# Patient Record
Sex: Female | Born: 1965 | Race: White | Hispanic: No | Marital: Married | Smoking: Never smoker
Health system: Southern US, Community
[De-identification: ages and names within clinical notes are randomized; demographics above are authoritative.]

## PROBLEM LIST (undated history)

## (undated) DIAGNOSIS — E785 Hyperlipidemia, unspecified: Secondary | ICD-10-CM

## (undated) DIAGNOSIS — I499 Cardiac arrhythmia, unspecified: Secondary | ICD-10-CM

## (undated) DIAGNOSIS — Z800 Family history of malignant neoplasm of digestive organs: Secondary | ICD-10-CM

## (undated) DIAGNOSIS — E079 Disorder of thyroid, unspecified: Secondary | ICD-10-CM

## (undated) DIAGNOSIS — C801 Malignant (primary) neoplasm, unspecified: Secondary | ICD-10-CM

## (undated) DIAGNOSIS — E039 Hypothyroidism, unspecified: Secondary | ICD-10-CM

## (undated) DIAGNOSIS — Z8042 Family history of malignant neoplasm of prostate: Secondary | ICD-10-CM

## (undated) DIAGNOSIS — Z803 Family history of malignant neoplasm of breast: Secondary | ICD-10-CM

## (undated) HISTORY — DX: Cardiac arrhythmia, unspecified: I49.9

## (undated) HISTORY — DX: Family history of malignant neoplasm of prostate: Z80.42

## (undated) HISTORY — DX: Hyperlipidemia, unspecified: E78.5

## (undated) HISTORY — DX: Family history of malignant neoplasm of breast: Z80.3

## (undated) HISTORY — DX: Disorder of thyroid, unspecified: E07.9

## (undated) HISTORY — PX: BREAST LUMPECTOMY: SHX2

## (undated) HISTORY — DX: Family history of malignant neoplasm of digestive organs: Z80.0

---

## 1994-08-15 HISTORY — PX: OTHER SURGICAL HISTORY: SHX169

## 1998-06-30 ENCOUNTER — Other Ambulatory Visit: Admission: RE | Admit: 1998-06-30 | Discharge: 1998-06-30 | Payer: Self-pay | Admitting: Obstetrics and Gynecology

## 1999-07-20 ENCOUNTER — Other Ambulatory Visit: Admission: RE | Admit: 1999-07-20 | Discharge: 1999-07-20 | Payer: Self-pay | Admitting: Obstetrics and Gynecology

## 2000-10-17 ENCOUNTER — Other Ambulatory Visit: Admission: RE | Admit: 2000-10-17 | Discharge: 2000-10-17 | Payer: Self-pay | Admitting: Obstetrics and Gynecology

## 2002-01-02 ENCOUNTER — Other Ambulatory Visit: Admission: RE | Admit: 2002-01-02 | Discharge: 2002-01-02 | Payer: Self-pay | Admitting: Obstetrics and Gynecology

## 2003-01-06 ENCOUNTER — Other Ambulatory Visit: Admission: RE | Admit: 2003-01-06 | Discharge: 2003-01-06 | Payer: Self-pay | Admitting: Obstetrics and Gynecology

## 2004-06-28 ENCOUNTER — Other Ambulatory Visit: Admission: RE | Admit: 2004-06-28 | Discharge: 2004-06-28 | Payer: Self-pay | Admitting: Obstetrics and Gynecology

## 2005-07-15 ENCOUNTER — Other Ambulatory Visit: Admission: RE | Admit: 2005-07-15 | Discharge: 2005-07-15 | Payer: Self-pay | Admitting: Obstetrics and Gynecology

## 2009-06-26 ENCOUNTER — Encounter: Admission: RE | Admit: 2009-06-26 | Discharge: 2009-06-26 | Payer: Self-pay | Admitting: Obstetrics and Gynecology

## 2010-09-05 ENCOUNTER — Encounter: Payer: Self-pay | Admitting: Obstetrics and Gynecology

## 2013-07-30 ENCOUNTER — Other Ambulatory Visit: Payer: Self-pay | Admitting: Obstetrics and Gynecology

## 2013-07-30 DIAGNOSIS — Z803 Family history of malignant neoplasm of breast: Secondary | ICD-10-CM

## 2014-03-03 ENCOUNTER — Ambulatory Visit: Payer: Self-pay | Admitting: Otolaryngology

## 2014-03-03 LAB — HCG, QUANTITATIVE, PREGNANCY: Beta Hcg, Quant.: 2 m[IU]/mL

## 2016-06-03 ENCOUNTER — Other Ambulatory Visit: Payer: Self-pay | Admitting: Physician Assistant

## 2016-06-03 DIAGNOSIS — R1084 Generalized abdominal pain: Secondary | ICD-10-CM

## 2016-06-09 ENCOUNTER — Encounter: Payer: Self-pay | Admitting: Radiology

## 2016-06-09 ENCOUNTER — Ambulatory Visit
Admission: RE | Admit: 2016-06-09 | Discharge: 2016-06-09 | Disposition: A | Discharge status: Home / Self Care | Payer: Managed Care, Other (non HMO) | Source: Ambulatory Visit | Attending: Physician Assistant | Admitting: Physician Assistant

## 2016-06-09 DIAGNOSIS — N854 Malposition of uterus: Secondary | ICD-10-CM | POA: Insufficient documentation

## 2016-06-09 DIAGNOSIS — M47816 Spondylosis without myelopathy or radiculopathy, lumbar region: Secondary | ICD-10-CM | POA: Diagnosis not present

## 2016-06-09 DIAGNOSIS — R109 Unspecified abdominal pain: Secondary | ICD-10-CM | POA: Insufficient documentation

## 2016-06-09 DIAGNOSIS — R1084 Generalized abdominal pain: Secondary | ICD-10-CM

## 2016-06-09 DIAGNOSIS — K760 Fatty (change of) liver, not elsewhere classified: Secondary | ICD-10-CM | POA: Diagnosis not present

## 2016-06-09 MED ORDER — IOPAMIDOL (ISOVUE-300) INJECTION 61%
100.0000 mL | Freq: Once | INTRAVENOUS | Status: AC | PRN
  Administered 2016-06-09: 100 mL via INTRAVENOUS

## 2016-07-12 ENCOUNTER — Emergency Department
Admission: EM | Admit: 2016-07-12 | Discharge: 2016-07-12 | Disposition: A | Discharge status: Home / Self Care | Payer: Managed Care, Other (non HMO) | Attending: Emergency Medicine | Admitting: Emergency Medicine

## 2016-07-12 ENCOUNTER — Encounter: Payer: Self-pay | Admitting: Emergency Medicine

## 2016-07-12 ENCOUNTER — Emergency Department: Payer: Managed Care, Other (non HMO)

## 2016-07-12 DIAGNOSIS — J209 Acute bronchitis, unspecified: Secondary | ICD-10-CM | POA: Diagnosis not present

## 2016-07-12 DIAGNOSIS — R05 Cough: Secondary | ICD-10-CM | POA: Diagnosis present

## 2016-07-12 MED ORDER — BENZONATATE 200 MG PO CAPS: 200.0000 mg | ORAL_CAPSULE | Freq: Three times a day (TID) | ORAL | 0 refills | Status: DC | PRN

## 2016-07-12 MED ORDER — METHYLPREDNISOLONE SODIUM SUCC 125 MG IJ SOLR
125.0000 mg | Freq: Once | INTRAMUSCULAR | Status: AC
  Administered 2016-07-12: 125 mg via INTRAMUSCULAR
  Filled 2016-07-12: qty 2

## 2016-07-12 MED ORDER — ALBUTEROL SULFATE HFA 108 (90 BASE) MCG/ACT IN AERS: 2.0000 | INHALATION_SPRAY | RESPIRATORY_TRACT | 0 refills | Status: DC | PRN

## 2016-07-12 MED ORDER — PREDNISONE 50 MG PO TABS: 50.0000 mg | ORAL_TABLET | Freq: Every day | ORAL | 0 refills | Status: DC

## 2016-07-12 MED ORDER — PSEUDOEPH-BROMPHEN-DM 30-2-10 MG/5ML PO SYRP: 10.0000 mL | ORAL_SOLUTION | Freq: Four times a day (QID) | ORAL | 0 refills | Status: DC | PRN

## 2016-07-12 MED ORDER — ACETAMINOPHEN 325 MG PO TABS
ORAL_TABLET | ORAL | Status: AC
  Filled 2016-07-12: qty 2

## 2016-07-12 MED ORDER — AZITHROMYCIN 500 MG PO TABS
500.0000 mg | ORAL_TABLET | Freq: Once | ORAL | Status: AC
  Administered 2016-07-12: 500 mg via ORAL
  Filled 2016-07-12: qty 1

## 2016-07-12 MED ORDER — AZITHROMYCIN 250 MG PO TABS: ORAL_TABLET | ORAL | 0 refills | Status: DC

## 2016-07-12 MED ORDER — ACETAMINOPHEN 325 MG PO TABS
650.0000 mg | ORAL_TABLET | Freq: Once | ORAL | Status: AC
Start: 2016-07-12 — End: 2016-07-12
  Administered 2016-07-12: 650 mg via ORAL

## 2016-07-12 NOTE — ED Provider Notes (Signed)
[facility] Emergency Department Provider Note  ____________________________________________  Time seen: Approximately 8:54 PM  I have reviewed the triage vital signs and the nursing notes.   HISTORY  Chief Complaint Cough    HPI Kristina Harvey is a 50 y.o. female who presents emergency department complaining of possible pneumonia. Patient was seen at urgent care this afternoon and sent to the emergency department for "IV antibiotics for pneumonia." Per the patient, she has had a week's worth of dry, hacking cough with some anterior chest wall discomfort. Patient states that she has been running a fever but today increased 103F. Patient states that responded well to Tylenol but she felt like she needed "to be checked out." Patient was seen in urgent care and provider at urgent care sent patient to the emergency department for IV antibiotics for pneumonia. Patient presents with CD with x-rays. Patient denies any headache, nasal congestion, sore throat, ear pain, neck pain, chest pain, shortness of breath, abdominal pain, nausea or vomiting. Patient is tried over-the-counter medications with no relief.   History reviewed. No pertinent past medical history.  There are no active problems to display for this patient.   History reviewed. No pertinent surgical history.  Prior to Admission medications   Medication Sig Start Date End Date Taking? Authorizing Provider  albuterol (PROVENTIL HFA;VENTOLIN HFA) 108 (90 Base) MCG/ACT inhaler Inhale 2 puffs into the lungs every 4 (four) hours as needed for wheezing or shortness of breath. 07/12/16   Charline Bills Jeydan Barner, PA-C  azithromycin (ZITHROMAX Z-PAK) 250 MG tablet Take 1 tablet (250 mg) once daily 07/12/16   Charline Bills Dorthey Depace, PA-C  benzonatate (TESSALON) 200 MG capsule Take 1 capsule (200 mg total) by mouth 3 (three) times daily as needed for cough. 07/12/16   Charline Bills Zhion Pevehouse, PA-C   brompheniramine-pseudoephedrine-DM 30-2-10 MG/5ML syrup Take 10 mLs by mouth 4 (four) times daily as needed. 07/12/16   Charline Bills Odaliz Mcqueary, PA-C  predniSONE (DELTASONE) 50 MG tablet Take 1 tablet (50 mg total) by mouth daily with breakfast. 07/12/16   Charline Bills [employer], PA-C    Allergies Ibuprofen and Caffeine  No family history on file.  Social History Social History  Substance Use Topics  . Smoking status: Never Smoker  . Smokeless tobacco: Never Used  . Alcohol use No     Review of Systems  Constitutional: Positive fever/chills Eyes: No visual changes. No discharge ENT: No upper respiratory complaints. Cardiovascular: no chest pain. Respiratory: Positive cough. No SOB. Gastrointestinal: No abdominal pain.  No nausea, no vomiting.  Musculoskeletal: Negative for musculoskeletal pain. Skin: Negative for rash, abrasions, lacerations, ecchymosis. Neurological: Negative for headaches, focal weakness or numbness. 10-point ROS otherwise negative.  ____________________________________________   PHYSICAL EXAM:  VITAL SIGNS: ED Triage Vitals  Enc Vitals Group     BP 07/12/16 2021 121/64     Pulse Rate 07/12/16 2021 (!) 110     Resp 07/12/16 2021 20     Temp 07/12/16 2021 99.3 F (37.4 C)     Temp Source 07/12/16 2021 Oral     SpO2 07/12/16 2021 95 %     Weight 07/12/16 2019 161 lb (73 kg)     Height 07/12/16 2019 5\' 7"  (1.702 m)     Head Circumference --      Peak Flow --      Pain Score --      Pain Loc --      Pain Edu? --      Excl.  in [place]? --      Constitutional: Alert and oriented. Well appearing and in no acute distress. Eyes: Conjunctivae are normal. PERRL. EOMI. Head: Atraumatic. ENT:      Ears: EACs and TMs are unremarkable bilaterally.      Nose: No congestion/rhinnorhea.      Mouth/Throat: Mucous membranes are moist. Pharynx is nonerythematous and nonedematous. Neck: No stridor. Neck is supple with full range of  motion Hematological/Lymphatic/Immunilogical: No cervical lymphadenopathy. Cardiovascular: Normal rate, regular rhythm. Normal S1 and S2.  Good peripheral circulation. Respiratory: Normal respiratory effort without tachypnea or retractions. Lungs with a few scattered wheezes to left lung field. No rales or rhonchi.Kermit Balo air entry to the bases with no decreased or absent breath sounds. Musculoskeletal: Full range of motion to all extremities. No gross deformities appreciated. Neurologic:  Normal speech and language. No gross focal neurologic deficits are appreciated.  Skin:  Skin is warm, dry and intact. No rash noted. Psychiatric: Mood and affect are normal. Speech and behavior are normal. Patient exhibits appropriate insight and judgement.   ____________________________________________   LABS (all labs ordered are listed, but only abnormal results are displayed)  Labs Reviewed - No data to display ____________________________________________  EKG   ____________________________________________  RADIOLOGY   Patient presents with disc with outside films. I have personally reviewed the images. No visible osseous abnormality to visualized structures, cardiac silhouette is unremarkable. No evidence of pneumothorax. No effusions were visualized. No visualized atelectasis. Mild peribronchiolar thickening noted to left. No indication of acute pneumonia. ____________________________________________    PROCEDURES  Procedure(s) performed:    Procedures    Medications  acetaminophen (TYLENOL) tablet 650 mg (650 mg Oral Given 07/12/16 2140)  azithromycin (ZITHROMAX) tablet 500 mg (500 mg Oral Given 07/12/16 2210)  methylPREDNISolone sodium succinate (SOLU-MEDROL) 125 mg/2 mL injection 125 mg (125 mg Intramuscular Given 07/12/16 2211)     ____________________________________________   INITIAL IMPRESSION / ASSESSMENT AND PLAN / ED COURSE  Pertinent labs & imaging results that  were available during my care of the patient were reviewed by me and considered in my medical decision making (see chart for details).  Review of the [place] CSRS was performed in accordance of the [place] prior to dispensing any controlled drugs.  Clinical Course     Patient's diagnosis is consistent with acute bronchitis. Patient has had symptoms for a week and has worsening of symptoms. Due to patient's history, she will be treated with first round of antibiotics and injection of steroids in the emergency department.. Patient will be discharged home with prescriptions for Zithromax, prednisone, albuterol, cough medications. Patient is to follow up with primary care as needed or otherwise directed. Patient is given ED precautions to return to the ED for any worsening or new symptoms.     ____________________________________________  FINAL CLINICAL IMPRESSION(S) / ED DIAGNOSES  Final diagnoses:  Acute bronchitis, unspecified organism      NEW MEDICATIONS STARTED DURING THIS VISIT:  New Prescriptions   ALBUTEROL (PROVENTIL HFA;VENTOLIN HFA) 108 (90 BASE) MCG/ACT INHALER    Inhale 2 puffs into the lungs every 4 (four) hours as needed for wheezing or shortness of breath.   AZITHROMYCIN (ZITHROMAX Z-PAK) 250 MG TABLET    Take 1 tablet (250 mg) once daily   BENZONATATE (TESSALON) 200 MG CAPSULE    Take 1 capsule (200 mg total) by mouth 3 (three) times daily as needed for cough.   BROMPHENIRAMINE-PSEUDOEPHEDRINE-DM 30-2-10 MG/5ML SYRUP    Take 10 mLs by mouth 4 (four)  times daily as needed.   PREDNISONE (DELTASONE) 50 MG TABLET    Take 1 tablet (50 mg total) by mouth daily with breakfast.        This chart was dictated using voice recognition software/Dragon. Despite best efforts to proofread, errors can occur which can change the meaning. Any change was purely unintentional.    Darletta Moll, PA-C 07/12/16 2216    Nance Pear, MD 07/12/16 2256

## 2016-07-12 NOTE — ED Notes (Signed)
Pt reports that she went to urgent care [facility]) tonight and they told her that she had pneumonia and that she needed to be treated at the er with IV atb in order to return to work faster

## 2016-07-12 NOTE — ED Triage Notes (Addendum)
Patient ambulatory to triage with steady gait, without difficulty or distress noted; pt reports seen at Urgent Care and dx with pneumonia and told to come to ED for evaluation; pt st x 5 days having fever with chills with nonprod cough

## 2017-09-22 DIAGNOSIS — Z1379 Encounter for other screening for genetic and chromosomal anomalies: Secondary | ICD-10-CM | POA: Insufficient documentation

## 2019-06-30 ENCOUNTER — Other Ambulatory Visit: Payer: Self-pay

## 2019-06-30 ENCOUNTER — Encounter: Payer: Self-pay | Admitting: Emergency Medicine

## 2019-06-30 ENCOUNTER — Emergency Department: Payer: Managed Care, Other (non HMO)

## 2019-06-30 ENCOUNTER — Emergency Department
Admission: EM | Admit: 2019-06-30 | Discharge: 2019-06-30 | Disposition: A | Discharge status: Home / Self Care | Payer: Managed Care, Other (non HMO) | Attending: Emergency Medicine | Admitting: Emergency Medicine

## 2019-06-30 DIAGNOSIS — R531 Weakness: Secondary | ICD-10-CM | POA: Insufficient documentation

## 2019-06-30 DIAGNOSIS — I480 Paroxysmal atrial fibrillation: Secondary | ICD-10-CM | POA: Diagnosis not present

## 2019-06-30 DIAGNOSIS — R002 Palpitations: Secondary | ICD-10-CM | POA: Diagnosis present

## 2019-06-30 LAB — COMPREHENSIVE METABOLIC PANEL
ALT: 20 U/L (ref 0–44)
AST: 23 U/L (ref 15–41)
Albumin: 3.8 g/dL (ref 3.5–5.0)
Alkaline Phosphatase: 79 U/L (ref 38–126)
Anion gap: 11 (ref 5–15)
BUN: 14 mg/dL (ref 6–20)
CO2: 25 mmol/L (ref 22–32)
Calcium: 9.3 mg/dL (ref 8.9–10.3)
Chloride: 102 mmol/L (ref 98–111)
Creatinine, Ser: 0.78 mg/dL (ref 0.44–1.00)
GFR calc Af Amer: >60 mL/min (ref >60)
GFR calc non Af Amer: >60 mL/min (ref >60)
Glucose, Bld: 125 mg/dL — ABNORMAL HIGH (ref 70–99)
Potassium: 4 mmol/L (ref 3.5–5.1)
Sodium: 138 mmol/L (ref 135–145)
Total Bilirubin: 0.7 mg/dL (ref 0.3–1.2)
Total Protein: 7.4 g/dL (ref 6.5–8.1)

## 2019-06-30 LAB — CBC
HCT: 40.2 % (ref 36.0–46.0)
Hemoglobin: 13.5 g/dL (ref 12.0–15.0)
MCH: 32.5 pg (ref 26.0–34.0)
MCHC: 33.6 g/dL (ref 30.0–36.0)
MCV: 96.9 fL (ref 80.0–100.0)
Platelets: 288 10*3/uL (ref 150–400)
RBC: 4.15 MIL/uL (ref 3.87–5.11)
RDW: 11.5 % (ref 11.5–15.5)
WBC: 11.6 10*3/uL — ABNORMAL HIGH (ref 4.0–10.5)
nRBC: 0 % (ref 0.0–0.2)

## 2019-06-30 LAB — TROPONIN I (HIGH SENSITIVITY): Troponin I (High Sensitivity): 11 ng/L (ref <18)

## 2019-06-30 MED ORDER — METOPROLOL TARTRATE 25 MG PO TABS: 25.0000 mg | ORAL_TABLET | ORAL | 0 refills | Status: DC | PRN

## 2019-06-30 NOTE — ED Notes (Signed)
Unhooked pt to use the bathroom.  

## 2019-06-30 NOTE — ED Triage Notes (Signed)
Here for new onset afib rvr.  Pt was at urgent care for weakness and HR elevated.  5 mg IV metoprolol given by EMS.  Pt denies pain. No SHOB currently.

## 2019-06-30 NOTE — ED Notes (Signed)
Pt's husband now at bedside.

## 2019-06-30 NOTE — ED Provider Notes (Signed)
[facility] Emergency Department Provider Note   ____________________________________________    I have reviewed the triage vital signs and the nursing notes.   HISTORY  Chief Complaint Tachycardia     HPI Kristina Harvey is a 53 y.o. female who presents with complaints of palpitations and tachycardia.  Patient reports at approximately 1 PM today she felt that her heart was racing which made her feel quite weak, she denies shortness of breath or chest pain.  She has never had this before.  She does not smoke cigarettes.  She avoids caffeine stringently.  She denies fevers or chills or cough.  Has not taken any new substances recently.  Went to urgent care and they sent her to the emergency department because of EKG consistent with atrial fibrillation  History reviewed. No pertinent past medical history.  There are no active problems to display for this patient.   History reviewed. No pertinent surgical history.  Prior to Admission medications   Medication Sig Start Date End Date Taking? Authorizing Provider  albuterol (PROVENTIL HFA;VENTOLIN HFA) 108 (90 Base) MCG/ACT inhaler Inhale 2 puffs into the lungs every 4 (four) hours as needed for wheezing or shortness of breath. 07/12/16   Cuthriell, Charline Bills, PA-C  azithromycin (ZITHROMAX Z-PAK) 250 MG tablet Take 1 tablet (250 mg) once daily 07/12/16   Cuthriell, Charline Bills, PA-C  benzonatate (TESSALON) 200 MG capsule Take 1 capsule (200 mg total) by mouth 3 (three) times daily as needed for cough. 07/12/16   Cuthriell, Charline Bills, PA-C  brompheniramine-pseudoephedrine-DM 30-2-10 MG/5ML syrup Take 10 mLs by mouth 4 (four) times daily as needed. 07/12/16   Cuthriell, Charline Bills, PA-C  metoprolol tartrate (LOPRESSOR) 25 MG tablet Take 1 tablet (25 mg total) by mouth as needed. 06/30/19 06/29/20  Lavonia Drafts, MD  predniSONE (DELTASONE) 50 MG tablet Take 1 tablet (50 mg total) by mouth daily with breakfast.  07/12/16   Cuthriell, Charline Bills, PA-C     Allergies Caffeine  History reviewed. No pertinent family history.  Social History Social History   Tobacco Use  . Smoking status: Never Smoker  . Smokeless tobacco: Never Used  Substance Use Topics  . Alcohol use: No  . Drug use: Not on file    Review of Systems  Constitutional: No fever/chills Eyes: No visual changes.  ENT: No sore throat. Cardiovascular: As above Respiratory: As above Gastrointestinal: No abdominal pain.  No nausea, no vomiting.   Genitourinary: Negative for dysuria. Musculoskeletal: Negative for back pain. Skin: Negative for rash. Neurological: Negative for headaches    ____________________________________________   PHYSICAL EXAM:  VITAL SIGNS: ED Triage Vitals  Enc Vitals Group     BP 06/30/19 1606 (!) 152/92     Pulse Rate 06/30/19 1606 98     Resp 06/30/19 1606 18     Temp 06/30/19 1606 98.3 F (36.8 C)     Temp Source 06/30/19 1606 Oral     SpO2 06/30/19 1606 98 %     Weight 06/30/19 1559 77.1 kg (170 lb)     Height 06/30/19 1559 1.727 m (5\' 8" )     Head Circumference --      Peak Flow --      Pain Score 06/30/19 1559 0     Pain Loc --      Pain Edu? --      Excl. in [place]? --     Constitutional: Alert and oriented. No acute distress.  Nose: No congestion/rhinnorhea. Mouth/Throat:  Mucous membranes are moist.    Cardiovascular: Normal rate, irregularly irregular rhythm, normal heart sounds, normal peripheral circulation Respiratory: Normal respiratory effort.  No retractions. Lungs CTAB. Gastrointestinal: Soft and nontender. No distention.   Musculoskeletal: No lower extremity tenderness nor edema.  Warm and well perfused Neurologic:  Normal speech and language. No gross focal neurologic deficits are appreciated.  Skin:  Skin is warm, dry and intact. No rash noted. Psychiatric: Mood and affect are normal. Speech and behavior are normal.  ____________________________________________    LABS (all labs ordered are listed, but only abnormal results are displayed)  Labs Reviewed  CBC - Abnormal; Notable for the following components:      Result Value   WBC 11.6 (*)    All other components within normal limits  COMPREHENSIVE METABOLIC PANEL - Abnormal; Notable for the following components:   Glucose, Bld 125 (*)    All other components within normal limits  TROPONIN I (HIGH SENSITIVITY)   ____________________________________________  EKG  ED ECG REPORT I, Lavonia Drafts, the attending physician, personally viewed and interpreted this ECG.   Rhythm: Atrial fibrillation QRS Axis: normal Intervals: Abnormal ST/T Wave abnormalities: normal Narrative Interpretation: no evidence of acute ischemia  ____________________________________________  RADIOLOGY  Chest x-ray ____________________________________________   PROCEDURES  Procedure(s) performed: No  Procedures   Critical Care performed: No ____________________________________________   INITIAL IMPRESSION / ASSESSMENT AND PLAN / ED COURSE  Pertinent labs & imaging results that were available during my care of the patient were reviewed by me and considered in my medical decision making (see chart for details).  Patient with palpitations found to be in atrial fibrillation, given 5 mg of IV metoprolol by EMS with improvement in rate.  Initially when she first arrived, heart rate was in the 110 range but continued to decrease.  Pending labs, chest x-ray, patient is on the cardiac monitor.  This appears to be new onset atrial fibrillation, unknown cause  ----------------------------------------- 4:43 PM on 06/30/2019 -----------------------------------------  Patient is converted back to normal sinus rhythm.  Labs pending  Lab work overall reassuring, patient feels well no chest pain no palpitations, chest x-ray unremarkable.  Appropriate for discharge with outpatient follow-up with cardiology, return  precautions discussed    ____________________________________________   FINAL CLINICAL IMPRESSION(S) / ED DIAGNOSES  Final diagnoses:  Paroxysmal A-fib ([place])        Note:  This document was prepared using Dragon voice recognition software and may include unintentional dictation errors.   Lavonia Drafts, MD 06/30/19 [phone]

## 2019-07-01 ENCOUNTER — Encounter

## 2019-07-01 ENCOUNTER — Other Ambulatory Visit: Payer: Self-pay

## 2019-07-01 ENCOUNTER — Encounter: Payer: Self-pay | Admitting: Cardiovascular Disease

## 2019-07-01 ENCOUNTER — Ambulatory Visit (INDEPENDENT_AMBULATORY_CARE_PROVIDER_SITE_OTHER): Payer: Managed Care, Other (non HMO) | Admitting: Cardiovascular Disease

## 2019-07-01 VITALS — BP 142/92 | Ht 67.5 in | Wt 174.0 lb

## 2019-07-01 DIAGNOSIS — I480 Paroxysmal atrial fibrillation: Secondary | ICD-10-CM

## 2019-07-01 DIAGNOSIS — E782 Mixed hyperlipidemia: Secondary | ICD-10-CM

## 2019-07-01 MED ORDER — METOPROLOL SUCCINATE ER 25 MG PO TB24: 25.0000 mg | ORAL_TABLET | Freq: Every day | ORAL | 6 refills | Status: DC

## 2019-07-01 NOTE — Patient Instructions (Addendum)
Research CHADS VASC on line, Score 1   Medication Instructions:  Please start metoprolol succinate 25 mg once a day  Take metoprolol tartrate 1 pill as needed for breakthrough atrial fib  If you need a refill on your cardiac medications before your next appointment, please call your pharmacy.    Lab work: No new labs needed   If you have labs (blood work) drawn today and your tests are completely normal, you will receive your results only by: Marland Kitchen MyChart Message (if you have MyChart) OR . A paper copy in the mail If you have any lab test that is abnormal or we need to change your treatment, we will call you to review the results.   Testing/Procedures: No new testing needed   Follow-Up: At [facility], you and your health needs are our priority.  As part of our continuing mission to provide you with exceptional heart care, we have created designated Provider Care Teams.  These Care Teams include your primary Cardiologist (physician) and Advanced Practice Providers (APPs -  Physician Assistants and Nurse Practitioners) who all work together to provide you with the care you need, when you need it.  . You will need a follow up appointment in 12 months .   Please call our office 2 months in advance to schedule this appointment.    . Providers on your designated Care Team:   . Murray Hodgkins, NP . Christell Faith, PA-C . Marrianne Mood, PA-C  Any Other Special Instructions Will Be Listed Below (If Applicable).  For educational health videos Log in to : [email or web address] Or : [email or web address], password : [email or web address]

## 2019-07-01 NOTE — Progress Notes (Signed)
Cardiology Office Note  Date:  07/01/2019   ID:  Kristina Harvey, DOB Sep 08, 1965, MRN [id number]  PCP:  Aurea Graff.Marlou Sa, MD   Chief Complaint  Patient presents with  . other    F/u ED tach no complaints today. Meds reviewed verbally with pt.    HPI:  Kristina Harvey is a 53 y.o. female  with past medical history of Paroxysmal tachycardia dating back several years Periodic alcohol/seems to be social Snoring, unable to exclude sleep apnea Hyperlipidemia Presents for new patient evaluation for atrial fibrillation  Reports that she [place] with [place] home Mother-in-law driving which is stressful Around 1 PM appreciated some tachypalpitations Symptoms persisted for the drive home Felt lightheaded getting out of the car with some diaphoresis Went to urgent care EKG documented atrial fibrillation, transferred by EMS to the emergency room On route was given medication Converted to normal sinus rhythm by the time she got to the emergency room A. fib lasting 2 hours  Non-smoker, no diabetes Reports weight has been trending upwards Has been told that she has significant snoring by husband  Reports having episodes of paroxysmal tachycardia dating back several years Symptoms typically last for several minutes at a time, nothing long 1 episode every several months typically  Since the emergency room has not had any further episodes She did receive a prescription for metoprolol tartrate to take as needed but she has not had to take this yet She has not filled the prescription  EKG personally reviewed by myself on todays visit Shows normal sinus rhythm rate 74 bpm no significant ST-T wave changes  CT abdomen pelvis 2017 images pulled up and reviewed with her today  showing no significant aortic atherosclerosis  No labs available for review   PMH:   has a past medical history of Hyperlipidemia and Thyroid disease.  PSH:   History reviewed. No pertinent surgical  history.  Current Outpatient Medications  Medication Sig Dispense Refill  . ALPRAZolam (XANAX) 0.25 MG tablet Take 0.25 mg by mouth as needed.     Marland Kitchen levothyroxine (SYNTHROID) 75 MCG tablet Take 75 mcg by mouth daily.     . metoprolol tartrate (LOPRESSOR) 25 MG tablet Take 1 tablet (25 mg total) by mouth as needed. 30 tablet 0  . metoprolol succinate (TOPROL-XL) 25 MG 24 hr tablet Take 1 tablet (25 mg total) by mouth daily. Take with or immediately following a meal. 30 tablet 6   No current facility-administered medications for this visit.      Allergies:   Caffeine   Social History:  The patient  reports that she has never smoked. She has never used smokeless tobacco. She reports current alcohol use. She reports that she does not use drugs.   Family History:   family history includes Atrial fibrillation in her father.    Review of Systems: Review of Systems  Constitutional: Negative.   HENT: Negative.   Respiratory: Negative.   Cardiovascular: Negative.   Gastrointestinal: Negative.   Musculoskeletal: Negative.   Neurological: Negative.   Psychiatric/Behavioral: Negative.   All other systems reviewed and are negative.   PHYSICAL EXAM: VS:  BP (!) 142/92 (BP Location: Left Arm, Patient Position: Sitting, Cuff Size: Normal)   Ht 5' 7.5" (1.715 m)   Wt 174 lb (78.9 kg)   SpO2 98%   BMI 26.85 kg/m  , BMI Body mass index is 26.85 kg/m. GEN: Well nourished, well developed, in no acute distress HEENT: normal Neck:  no JVD, carotid bruits, or masses Cardiac: RRR; no murmurs, rubs, or gallops,no edema  Respiratory:  clear to auscultation bilaterally, normal work of breathing GI: soft, nontender, nondistended, + BS MS: no deformity or atrophy Skin: warm and dry, no rash Neuro:  Strength and sensation are intact Psych: euthymic mood, full affect   Recent Labs: 06/30/2019: ALT 20; BUN 14; Creatinine, Ser 0.78; Hemoglobin 13.5; Platelets 288; Potassium 4.0; Sodium 138     Lipid Panel No results found for: CHOL, HDL, LDLCALC, TRIG    Wt Readings from Last 3 Encounters:  07/01/19 174 lb (78.9 kg)  06/30/19 170 lb (77.1 kg)  07/12/16 161 lb (73 kg)      ASSESSMENT AND PLAN:  Problem List Items Addressed This Visit    None    Visit Diagnoses    Paroxysmal atrial fibrillation (HCC)    -  Primary   Relevant Medications   metoprolol succinate (TOPROL-XL) 25 MG 24 hr tablet   Other Relevant Orders   EKG 12-Lead   Mixed hyperlipidemia       Relevant Medications   metoprolol succinate (TOPROL-XL) 25 MG 24 hr tablet     Episode of atrial fibrillation lasting 2 hours Converted in the emergency room Recommend she start metoprolol succinate 25 mg daily CHADS VASC 1.  Will not start anticoagulation at this time Discussed with her in detail -Recommend she t ake metoprolol tartrate as needed for any breakthrough arrhythmia -Long discussion concerning risk factors for atrial fibrillation including alcohol, sleep apnea, weight gain, hypertension -She does not want a sleep study at this time, prefers to lose weight -She will moderate her alcohol intake -We discussed doing an echocardiogram.  Reports having high deductible, prefers to wait until her deductible is lower She will call us to order that later date  Hyperlipidemia Lab work numbers not available CT scan images reviewed showing no aortic atherosclerosis from top of kidneys through pelvis Likely has time to work on lifestyle modification before medication   Recommended she call us for any further episodes of atrial fibrillation Disposition:   F/U  12 months  Long discussion concerning atrial fibrillation, as well as management of hyperlipidemia, risk factors for coronary disease and atrial fibrillation  Total encounter time more than 60 minutes  Greater than 50% was spent in counseling and coordination of care with the patient    Signed, Esmond Plants, M.D., Ph.D. [place], [place]

## 2019-07-16 ENCOUNTER — Ambulatory Visit: Payer: Managed Care, Other (non HMO) | Admitting: Cardiovascular Disease

## 2019-10-01 DIAGNOSIS — I4891 Unspecified atrial fibrillation: Secondary | ICD-10-CM | POA: Insufficient documentation

## 2019-10-01 DIAGNOSIS — E079 Disorder of thyroid, unspecified: Secondary | ICD-10-CM | POA: Insufficient documentation

## 2020-04-13 ENCOUNTER — Other Ambulatory Visit: Payer: Self-pay | Admitting: Cardiovascular Disease

## 2020-06-25 ENCOUNTER — Encounter: Payer: Self-pay | Admitting: Cardiovascular Disease

## 2020-06-29 NOTE — Progress Notes (Signed)
Cardiology Office Note  Date:  06/30/2020   ID:  Kristina Harvey, DOB August 18, 1965, MRN [id number]  PCP:  Aurea Graff.Marlou Sa, MD   Chief Complaint  Patient presents with  . Other    12 month follwo up. Meds reviewed vebrally with patient.     HPI:  Kristina Harvey is a 54 y.o. female  with past medical history of Paroxysmal tachycardia dating back several years Periodic alcohol Snoring, unable to exclude sleep apnea Hyperlipidemia Presents for  atrial fibrillation  In general doing well Two epsiodes tachycardia concerning for atrial fibrillation,  Possibly triggered when husband driving, stress Feels heart rate is erratic, rapid, has symptoms of near syncope Lasted 20 min Takes metoprolol tartrate and symptoms resolved  On colestipol for loose bowel movements, IBS  [place]es down to the beach Previous had episode atrial fibrillation when Mother-in-law driving Also has Xanax  EKG personally reviewed by myself on todays visit Shows normal sinus rhythm rate 83 bpm no significant ST-T wave changes   Lab work reviewed from primary care total cholesterol 230, LDL 143 August 2021  Since then she reports she has started the colestipol  Scheduled for repeat lab work  prior episode; Felt lightheaded getting out of the car with some diaphoresis Went to urgent care EKG documented atrial fibrillation, transferred by EMS to the emergency room On route was given medication Converted to normal sinus rhythm by the time she got to the emergency room A. fib lasting 2 hours  Husband reported that she has snoring  paroxysmal tachycardia dating back several years  CT abdomen pelvis 2017 images pulled up and reviewed  showing no significant aortic atherosclerosis    PMH:   has a past medical history of Hyperlipidemia and Thyroid disease.  PSH:   History reviewed. No pertinent surgical history.  Current Outpatient Medications  Medication Sig Dispense Refill  . ALPRAZolam (XANAX) 0.25 MG  tablet Take 0.25 mg by mouth as needed.     . colestipol (COLESTID) 1 g tablet Take 1 g by mouth 2 (two) times daily.    Marland Kitchen levothyroxine (SYNTHROID) 75 MCG tablet Take 75 mcg by mouth daily.     . metoprolol succinate (TOPROL-XL) 25 MG 24 hr tablet TAKE ONE TABLET BY MOUTH DAILY - TAKE WITH OR IMMEDIATELY FOLLOWING A MEAL 90 tablet 0  . metoprolol tartrate (LOPRESSOR) 25 MG tablet Take 1 tablet (25 mg total) by mouth as needed. 30 tablet 0   No current facility-administered medications for this visit.     Allergies:   Caffeine   Social History:  The patient  reports that she has never smoked. She has never used smokeless tobacco. She reports current alcohol use. She reports that she does not use drugs.   Family History:   family history includes Atrial fibrillation in her father.    Review of Systems: Review of Systems  Constitutional: Negative.   HENT: Negative.   Respiratory: Negative.   Cardiovascular: Negative.   Gastrointestinal: Negative.   Musculoskeletal: Negative.   Neurological: Negative.   Psychiatric/Behavioral: Negative.   All other systems reviewed and are negative.   PHYSICAL EXAM: VS:  BP 138/82 (BP Location: Left Arm, Patient Position: Sitting, Cuff Size: Normal)   Pulse 83   Ht 5\' 7"  (1.702 m)   Wt 173 lb (78.5 kg)   SpO2 97%   BMI 27.10 kg/m  , BMI Body mass index is 27.1 kg/m. GEN: Well nourished, well developed, in no acute distress HEENT: normal Neck: no  JVD, carotid bruits, or masses Cardiac: RRR; no murmurs, rubs, or gallops,no edema  Respiratory:  clear to auscultation bilaterally, normal work of breathing GI: soft, nontender, nondistended, + BS MS: no deformity or atrophy Skin: warm and dry, no rash Neuro:  Strength and sensation are intact Psych: euthymic mood, full affect   Recent Labs: No results found for requested labs within last 8760 hours.    Lipid Panel No results found for: CHOL, HDL, LDLCALC, TRIG    Wt Readings from Last  3 Encounters:  06/30/20 173 lb (78.5 kg)  07/01/19 174 lb (78.9 kg)  06/30/19 170 lb (77.1 kg)      ASSESSMENT AND PLAN:  Problem List Items Addressed This Visit    None    Visit Diagnoses    Paroxysmal atrial fibrillation (HCC)    -  Primary   Relevant Medications   colestipol (COLESTID) 1 g tablet   Mixed hyperlipidemia       Relevant Medications   colestipol (COLESTID) 1 g tablet     Paroxysmal atrial fibrillation Recommend she continue metoprolol succinate 25, we could increase up to 37.5 daily if she has frequent episodes Suggested she could take extra metoprolol tartrate on days when she is driving down to the beach as she has more episodes when driving in the car as a passenger Currently not on anticoagulation, low CHADS VASC  Hyperlipidemia CT scan images reviewed showing no aortic atherosclerosis from top of kidneys through pelvis Before starting additional medication she would like to see how her numbers progress on colestipol   Total encounter time more than 25 minutes  Greater than 50% was spent in counseling and coordination of care with the patient    Signed, Esmond Plants, M.D., Ph.D. [place], [place]

## 2020-06-30 ENCOUNTER — Other Ambulatory Visit: Payer: Self-pay

## 2020-06-30 ENCOUNTER — Ambulatory Visit (INDEPENDENT_AMBULATORY_CARE_PROVIDER_SITE_OTHER): Payer: Managed Care, Other (non HMO) | Admitting: Cardiovascular Disease

## 2020-06-30 ENCOUNTER — Encounter: Payer: Self-pay | Admitting: Cardiovascular Disease

## 2020-06-30 VITALS — BP 138/82 | HR 83 | Ht 67.0 in | Wt 173.0 lb

## 2020-06-30 DIAGNOSIS — E782 Mixed hyperlipidemia: Secondary | ICD-10-CM | POA: Diagnosis not present

## 2020-06-30 DIAGNOSIS — I480 Paroxysmal atrial fibrillation: Secondary | ICD-10-CM | POA: Diagnosis not present

## 2020-06-30 NOTE — Patient Instructions (Signed)
Medication Instructions:  No changes  Ok to take a little extra metoprolol succinate as needed   If you need a refill on your cardiac medications before your next appointment, please call your pharmacy.    Lab work: No new labs needed   If you have labs (blood work) drawn today and your tests are completely normal, you will receive your results only by: Marland Kitchen MyChart Message (if you have MyChart) OR . A paper copy in the mail If you have any lab test that is abnormal or we need to change your treatment, we will call you to review the results.   Testing/Procedures: No new testing needed   Follow-Up: At [facility], you and your health needs are our priority.  As part of our continuing mission to provide you with exceptional heart care, we have created designated Provider Care Teams.  These Care Teams include your primary Cardiologist (physician) and Advanced Practice Providers (APPs -  Physician Assistants and Nurse Practitioners) who all work together to provide you with the care you need, when you need it.  . You will need a follow up appointment in 12 months  . Providers on your designated Care Team:   . Murray Hodgkins, NP . Christell Faith, PA-C . Marrianne Mood, PA-C  Any Other Special Instructions Will Be Listed Below (If Applicable).  COVID-19 Vaccine Information can be found at: [email or web address].uk For questions related to vaccine distribution or appointments, please email [email or web address] or call [phone].

## 2020-10-19 ENCOUNTER — Other Ambulatory Visit: Payer: Self-pay | Admitting: Obstetrics and Gynecology

## 2020-10-19 DIAGNOSIS — R928 Other abnormal and inconclusive findings on diagnostic imaging of breast: Secondary | ICD-10-CM

## 2020-10-30 ENCOUNTER — Other Ambulatory Visit: Payer: Self-pay | Admitting: Cardiovascular Disease

## 2020-11-02 ENCOUNTER — Ambulatory Visit
Admission: RE | Admit: 2020-11-02 | Discharge: 2020-11-02 | Disposition: A | Discharge status: Home / Self Care | Payer: Managed Care, Other (non HMO) | Source: Ambulatory Visit | Attending: Obstetrics and Gynecology | Admitting: Obstetrics and Gynecology

## 2020-11-02 ENCOUNTER — Other Ambulatory Visit: Payer: Self-pay | Admitting: Obstetrics and Gynecology

## 2020-11-02 ENCOUNTER — Other Ambulatory Visit: Payer: Self-pay

## 2020-11-02 DIAGNOSIS — R928 Other abnormal and inconclusive findings on diagnostic imaging of breast: Secondary | ICD-10-CM

## 2020-11-04 ENCOUNTER — Ambulatory Visit
Admission: RE | Admit: 2020-11-04 | Discharge: 2020-11-04 | Disposition: A | Discharge status: Home / Self Care | Payer: Managed Care, Other (non HMO) | Source: Ambulatory Visit | Attending: Obstetrics and Gynecology | Admitting: Obstetrics and Gynecology

## 2020-11-04 ENCOUNTER — Other Ambulatory Visit: Payer: Self-pay

## 2020-11-04 DIAGNOSIS — R928 Other abnormal and inconclusive findings on diagnostic imaging of breast: Secondary | ICD-10-CM

## 2020-11-06 ENCOUNTER — Telehealth: Payer: Self-pay | Admitting: Hematology and Oncology

## 2020-11-06 ENCOUNTER — Other Ambulatory Visit: Payer: Self-pay | Admitting: Obstetrics and Gynecology

## 2020-11-06 DIAGNOSIS — Z853 Personal history of malignant neoplasm of breast: Secondary | ICD-10-CM

## 2020-11-06 NOTE — Telephone Encounter (Signed)
Spoke to patient to confirm afternoon BC for 3/30, packet emailed to patient

## 2020-11-09 ENCOUNTER — Encounter: Payer: Self-pay | Admitting: *Deleted

## 2020-11-09 ENCOUNTER — Ambulatory Visit
Admission: RE | Admit: 2020-11-09 | Discharge: 2020-11-09 | Disposition: A | Discharge status: Home / Self Care | Payer: Managed Care, Other (non HMO) | Source: Ambulatory Visit | Attending: Obstetrics and Gynecology | Admitting: Obstetrics and Gynecology

## 2020-11-09 DIAGNOSIS — Z171 Estrogen receptor negative status [ER-]: Secondary | ICD-10-CM

## 2020-11-09 DIAGNOSIS — C50512 Malignant neoplasm of lower-outer quadrant of left female breast: Secondary | ICD-10-CM | POA: Insufficient documentation

## 2020-11-09 DIAGNOSIS — Z853 Personal history of malignant neoplasm of breast: Secondary | ICD-10-CM

## 2020-11-09 MED ORDER — GADOBUTROL 1 MMOL/ML IV SOLN
8.0000 mL | Freq: Once | INTRAVENOUS | Status: AC | PRN
  Administered 2020-11-09: 8 mL via INTRAVENOUS

## 2020-11-10 NOTE — Progress Notes (Signed)
[place] NOTE  Patient Care Team: Alroy Dust, L.Marlou Sa, MD as PCP - General (Family Medicine) Jovita Kussmaul, MD as Consulting Physician (General Surgery) Nicholas Lose, MD as Consulting Physician (Hematology and Oncology) Gery Pray, MD as Consulting Physician (Radiation Oncology) Mauro Kaufmann, RN as Oncology Nurse Navigator Rockwell Germany, RN as Oncology Nurse Navigator  CHIEF COMPLAINTS/PURPOSE OF CONSULTATION:  Newly diagnosed breast cancer  HISTORY OF PRESENTING ILLNESS:  Kristina Harvey 55 y.o. female is here because of recent diagnosis of invasive ductal carcinoma of the left breast. Screening mammogram on 10/14/20 showed a possible left breast mass and enlarged axillary lymph nodes. Diagnostic mammogram and [place] on 11/02/20 showed a 1.4cm mass at the 3:30 position in the left breast and 2 abnormal lymph nodes in the left axilla. Biopsy on 11/04/20 showed invasive ductal carcinoma in the breast and axilla, grade 3, HER-2 equivocal by IHC (2+), ER/PR negative, Ki67 20%. She presents to the clinic today for initial evaluation and discussion of treatment options.   I reviewed her records extensively and collaborated the history with the patient.  SUMMARY OF ONCOLOGIC HISTORY: Oncology History  Malignant neoplasm of lower-outer quadrant of left breast of female, estrogen receptor negative ([place])  11/04/2020 Initial Diagnosis   Screening mammogram showed a possible left breast mass and enlarged axillary lymph nodes. Diagnostic mammogram and US showed a 1.4cm mass at the 3:30 position in the left breast and 2 abnormal lymph nodes in the left axilla. 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     MEDICAL HISTORY:  Past Medical History:  Diagnosis Date  . Arrhythmia   . Hyperlipidemia   . Thyroid disease     SURGICAL HISTORY: Past Surgical History:  Procedure Laterality Date   . chin implant  1996    SOCIAL HISTORY: Social History   Socioeconomic History  . Marital status: Married    Spouse name: Not on file  . Number of children: Not on file  . Years of education: Not on file  . Highest education level: Not on file  Occupational History  . Not on file  Tobacco Use  . Smoking status: Never Smoker  . Smokeless tobacco: Never Used  Substance and Sexual Activity  . Alcohol use: Yes    Comment: Socially  . Drug use: Never  . Sexual activity: Not on file  Other Topics Concern  . Not on file  Social History Narrative  . Not on file   Social Determinants of Health   Financial Resource Strain: Not on file  Food Insecurity: Not on file  Transportation Needs: Not on file  Physical Activity: Not on file  Stress: Not on file  Social Connections: Not on file  Intimate Partner Violence: Not on file    FAMILY HISTORY: Family History  Problem Relation Age of Onset  . Breast cancer Mother   . Atrial fibrillation Father   . Breast cancer Maternal Grandmother   . Breast cancer Paternal Grandmother   . Breast cancer Maternal Aunt     ALLERGIES:  is allergic to caffeine.  MEDICATIONS:  Current Outpatient Medications  Medication Sig Dispense Refill  . ALPRAZolam (XANAX) 0.25 MG tablet Take 0.25 mg by mouth as needed.     . colestipol (COLESTID) 1 g tablet Take 1 g by mouth 2 (two) times daily.    Marland Kitchen levothyroxine (SYNTHROID) 75 MCG tablet Take 75 mcg by  mouth daily.     . metoprolol succinate (TOPROL-XL) 25 MG 24 hr tablet TAKE ONE TABLET BY MOUTH DAILY WITH OR IMMEDIATELY FOLLOWING A MEAL 90 tablet 1  . metoprolol tartrate (LOPRESSOR) 25 MG tablet Take 25 mg by mouth as needed.     No current facility-administered medications for this visit.    REVIEW OF SYSTEMS:     Breast: Bruising from recent biopsies All other systems were reviewed with the patient and are negative.  PHYSICAL EXAMINATION: ECOG PERFORMANCE STATUS: 1 - Symptomatic but  completely ambulatory  Vitals:   11/11/20 1319  BP: (!) 144/78  Pulse: 81  Resp: 18  Temp: 97.7 F (36.5 C)  SpO2: 100%   Filed Weights   11/11/20 1319  Weight: 172 lb 3.2 oz (78.1 kg)     BREAST: Bruise in the left breast from recent biopsies. No palpable axillary or supraclavicular lymphadenopathy (exam performed in the presence of a chaperone)   LABORATORY DATA:  I have reviewed the data as listed Lab Results  Component Value Date   WBC 8.7 11/11/2020   HGB 13.1 11/11/2020   HCT 40.6 11/11/2020   MCV 98.3 11/11/2020   PLT 264 11/11/2020   Lab Results  Component Value Date   NA 138 11/11/2020   K 4.4 11/11/2020   CL 103 11/11/2020   CO2 25 11/11/2020    RADIOGRAPHIC STUDIES: I have personally reviewed the radiological reports and agreed with the findings in the report.  ASSESSMENT AND PLAN:  Malignant neoplasm of lower-outer quadrant of left breast of female, estrogen receptor negative ([place]) 11/04/2020:Screening mammogram showed a possible left breast mass and enlarged axillary lymph nodes. Diagnostic mammogram and US showed a 1.4cm mass at the 3:30 position in the left breast and 2 abnormal lymph nodes in the left axilla. 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  Pathology and radiology counseling: Discussed with the patient, the details of pathology including the type of breast cancer,the clinical staging, the significance of ER, PR and HER-2/neu receptors and the implications for treatment. After reviewing the pathology in detail, we proceeded to discuss the different treatment options between surgery, radiation, chemotherapy, antiestrogen therapies.  Recommendation based on multidisciplinary tumor board: 1. Neoadjuvant chemotherapy with TCH Perjeta 6 cycles followed by Herceptin Perjeta maintenance versus Kadcyla maintenance (based on response to neoadjuvant chemo) for 1  year 2. Followed by breast conserving surgery if possible with targeted node dissection versus ALND 3. Followed by adjuvant radiation therapy 4.  Consideration for neratinib  Chemotherapy Counseling: I discussed the risks and benefits of chemotherapy including the risks of nausea/ vomiting, risk of infection from low WBC count, fatigue due to chemo or anemia, bruising or bleeding due to low platelets, mouth sores, loss/ change in taste and decreased appetite. Liver and kidney function will be monitored through out chemotherapy as abnormalities in liver and kidney function may be a side effect of treatment. Cardiac dysfunction due to Herceptin and Perjeta and neuropathy risk from Taxotere were discussed in detail. Risk of permanent bone marrow dysfunction due to chemo were also discussed.  Plan: 1. Port placement 2. Echocardiogram 3. Chemotherapy class 4. Breast MRI 5. URCC breast nausea study  URCC 16070: Treatment of refractory nausea.  After first cycle of chemo if patient experience chemo induced nausea and vomiting the randomized from cycle 2 to Aloxi plus Dex plus olanzapine or placebo plus Compazine or placebo plus placebo  prior to chemo and take home medications for day 2 today for of Dex plus olanzapine or placebo and Compazine or placebo every 8 hours.  If patient does not have nausea after cycle 1, then the trial is complete.  Return to clinic in 2 weeks to start chemotherapy.      All questions were answered. The patient knows to call the clinic with any problems, questions or concerns.   Rulon Eisenmenger, MD, MPH 11/11/2020    I, Molly Dorshimer, am acting as scribe for Nicholas Lose, MD.  I have reviewed the above documentation for accuracy and completeness, and I agree with the above.

## 2020-11-11 ENCOUNTER — Ambulatory Visit: Payer: Managed Care, Other (non HMO) | Admitting: Genetic Counselor

## 2020-11-11 ENCOUNTER — Inpatient Hospital Stay: Payer: Managed Care, Other (non HMO) | Attending: Hematology and Oncology | Admitting: Hematology and Oncology

## 2020-11-11 ENCOUNTER — Encounter: Payer: Self-pay | Admitting: *Deleted

## 2020-11-11 ENCOUNTER — Ambulatory Visit: Payer: Managed Care, Other (non HMO) | Admitting: Physical Therapy

## 2020-11-11 ENCOUNTER — Other Ambulatory Visit: Payer: Self-pay

## 2020-11-11 ENCOUNTER — Ambulatory Visit
Admission: RE | Admit: 2020-11-11 | Discharge: 2020-11-11 | Disposition: A | Discharge status: Home / Self Care | Payer: Managed Care, Other (non HMO) | Source: Ambulatory Visit | Attending: Radiation Oncology | Admitting: Radiation Oncology

## 2020-11-11 ENCOUNTER — Encounter: Payer: Self-pay | Admitting: Radiology

## 2020-11-11 ENCOUNTER — Ambulatory Visit: Payer: Self-pay | Admitting: General Surgery

## 2020-11-11 ENCOUNTER — Inpatient Hospital Stay: Payer: Managed Care, Other (non HMO)

## 2020-11-11 ENCOUNTER — Encounter: Payer: Self-pay | Admitting: Physical Therapy

## 2020-11-11 VITALS — BP 144/78 | HR 81 | Temp 97.7°F | Resp 18 | Ht 67.0 in | Wt 172.2 lb

## 2020-11-11 DIAGNOSIS — Z171 Estrogen receptor negative status [ER-]: Secondary | ICD-10-CM

## 2020-11-11 DIAGNOSIS — C50512 Malignant neoplasm of lower-outer quadrant of left female breast: Secondary | ICD-10-CM

## 2020-11-11 DIAGNOSIS — Z8042 Family history of malignant neoplasm of prostate: Secondary | ICD-10-CM

## 2020-11-11 DIAGNOSIS — Z803 Family history of malignant neoplasm of breast: Secondary | ICD-10-CM | POA: Insufficient documentation

## 2020-11-11 DIAGNOSIS — R293 Abnormal posture: Secondary | ICD-10-CM | POA: Insufficient documentation

## 2020-11-11 DIAGNOSIS — E079 Disorder of thyroid, unspecified: Secondary | ICD-10-CM | POA: Diagnosis not present

## 2020-11-11 DIAGNOSIS — Z8249 Family history of ischemic heart disease and other diseases of the circulatory system: Secondary | ICD-10-CM | POA: Diagnosis not present

## 2020-11-11 DIAGNOSIS — Z800 Family history of malignant neoplasm of digestive organs: Secondary | ICD-10-CM | POA: Diagnosis not present

## 2020-11-11 DIAGNOSIS — Z79899 Other long term (current) drug therapy: Secondary | ICD-10-CM | POA: Insufficient documentation

## 2020-11-11 LAB — CBC WITH DIFFERENTIAL (CANCER CENTER ONLY)
Abs Immature Granulocytes: 0.02 10*3/uL (ref 0.00–0.07)
Basophils Absolute: 0 10*3/uL (ref 0.0–0.1)
Basophils Relative: 0 %
Eosinophils Absolute: 0 10*3/uL (ref 0.0–0.5)
Eosinophils Relative: 1 %
HCT: 40.6 % (ref 36.0–46.0)
Hemoglobin: 13.1 g/dL (ref 12.0–15.0)
Immature Granulocytes: 0 %
Lymphocytes Relative: 22 %
Lymphs Abs: 1.9 10*3/uL (ref 0.7–4.0)
MCH: 31.7 pg (ref 26.0–34.0)
MCHC: 32.3 g/dL (ref 30.0–36.0)
MCV: 98.3 fL (ref 80.0–100.0)
Monocytes Absolute: 0.9 10*3/uL (ref 0.1–1.0)
Monocytes Relative: 10 %
Neutro Abs: 5.9 10*3/uL (ref 1.7–7.7)
Neutrophils Relative %: 67 %
Platelet Count: 264 10*3/uL (ref 150–400)
RBC: 4.13 MIL/uL (ref 3.87–5.11)
RDW: 11.7 % (ref 11.5–15.5)
WBC Count: 8.7 10*3/uL (ref 4.0–10.5)
nRBC: 0 % (ref 0.0–0.2)

## 2020-11-11 LAB — CMP (CANCER CENTER ONLY)
ALT: 14 U/L (ref 0–44)
AST: 14 U/L — ABNORMAL LOW (ref 15–41)
Albumin: 3.9 g/dL (ref 3.5–5.0)
Alkaline Phosphatase: 69 U/L (ref 38–126)
Anion gap: 10 (ref 5–15)
BUN: 9 mg/dL (ref 6–20)
CO2: 25 mmol/L (ref 22–32)
Calcium: 9.1 mg/dL (ref 8.9–10.3)
Chloride: 103 mmol/L (ref 98–111)
Creatinine: 0.72 mg/dL (ref 0.44–1.00)
GFR, Estimated: >60 mL/min (ref >60)
Glucose, Bld: 98 mg/dL (ref 70–99)
Potassium: 4.4 mmol/L (ref 3.5–5.1)
Sodium: 138 mmol/L (ref 135–145)
Total Bilirubin: 0.4 mg/dL (ref 0.3–1.2)
Total Protein: 7.5 g/dL (ref 6.5–8.1)

## 2020-11-11 LAB — GENETIC SCREENING ORDER

## 2020-11-11 MED ORDER — ONDANSETRON HCL 8 MG PO TABS: 8.0000 mg | ORAL_TABLET | Freq: Two times a day (BID) | ORAL | 1 refills | Status: DC | PRN

## 2020-11-11 MED ORDER — DEXAMETHASONE 4 MG PO TABS: 4.0000 mg | ORAL_TABLET | Freq: Two times a day (BID) | ORAL | 0 refills | Status: DC

## 2020-11-11 MED ORDER — LIDOCAINE-PRILOCAINE 2.5-2.5 % EX CREA: TOPICAL_CREAM | CUTANEOUS | 3 refills | Status: DC

## 2020-11-11 MED ORDER — PROCHLORPERAZINE MALEATE 10 MG PO TABS: 10.0000 mg | ORAL_TABLET | Freq: Four times a day (QID) | ORAL | 1 refills | Status: DC | PRN

## 2020-11-11 NOTE — Progress Notes (Signed)
START ON PATHWAY REGIMEN - Breast ° ° °  A cycle is every 21 days: °    Pertuzumab  °    Pertuzumab  °    Trastuzumab-xxxx  °    Trastuzumab-xxxx  °    Carboplatin  °    Docetaxel  ° °**Always confirm dose/schedule in your pharmacy ordering system** ° °Patient Characteristics: °Preoperative or Nonsurgical Candidate (Clinical Staging), Neoadjuvant Therapy followed by Surgery, Invasive Disease, Chemotherapy, HER2 Positive, ER Negative/Unknown °Therapeutic Status: Preoperative or Nonsurgical Candidate (Clinical Staging) °AJCC M Category: cM0 °AJCC Grade: G3 °Breast Surgical Plan: Neoadjuvant Therapy followed by Surgery °ER Status: Negative (-) °AJCC 8 Stage Grouping: IIA °HER2 Status: Positive (+) °AJCC T Category: cT1c °AJCC N Category: cN1 °PR Status: Negative (-) °Intent of Therapy: °Curative Intent, Discussed with Patient °

## 2020-11-11 NOTE — Progress Notes (Signed)
Radiation Oncology         [phone] ________________________________  Multidisciplinary Breast Oncology Clinic [facility]) Initial Outpatient Consultation  Name: Kristina Harvey MRN: [id number]  Date: 11/11/2020  DOB: Apr 07, 1966  [place]:[id number], L.Marlou Sa, MD  Jovita Kussmaul, MD   REFERRING PHYSICIAN: Autumn Messing III, MD  DIAGNOSIS: The encounter diagnosis was Malignant neoplasm of lower-outer quadrant of left breast of female, estrogen receptor negative ([place]).  Stage II-A (cT1c, cN1)  Left Breast LOQ, Invasive Ductal Carcinoma, ER- / PR- / Her2+, Grade 3    ICD-10-CM   1. Malignant neoplasm of lower-outer quadrant of left breast of female, estrogen receptor negative ([place])  C50.512    Z17.1     HISTORY OF PRESENT ILLNESS::Kristina Harvey is a 55 y.o. female who is presenting to the office today for evaluation of her newly diagnosed breast cancer. She is accompanied by her husband.   She had routine screening mammography on 10/14/2020 that showed a possible mass in the left breast as well as enlarged axillary lymph nodes. She underwent unilateral diagnostic mammography with tomography and left breast ultrasonography at [facility] on 11/02/2020 that showed a suspicious left breast mass at the 3 o'clock position. There was also noted to be suspicious left axillary lymphadenopathy.  There were at least 4 abnormal appearing lymph nodes on ultrasound.  Biopsy on 11/04/2020 showed: grade 3 invasive ductal carcinoma with a metastatic left axillary lymph node. Prognostic indicators significant for: estrogen receptor, 0% negative and progesterone receptor, 0% negative. Proliferation marker Ki67 at 20%. HER2 positive.  MRI of bilateral breasts on 11/09/2020 showed a 1.5 cm irregular enhancing mass in the lower outer quadrant of the left breat that corresponded with the biopsy-proven invasive ductal carcinoma. There were also noted to be two enlarged abnormal left axillary lymph nodes that corresponded  with the axillary metastasis.   The patient was referred today for presentation in the multidisciplinary conference.  Radiology studies and pathology slides were presented there for review and discussion of treatment options.  A consensus was discussed regarding potential next steps.  PREVIOUS RADIATION THERAPY: No  PAST MEDICAL HISTORY:  Past Medical History:  Diagnosis Date  . Arrhythmia   . Family history of breast cancer   . Family history of prostate cancer   . Family history of stomach cancer   . Hyperlipidemia   . Thyroid disease     PAST SURGICAL HISTORY: Past Surgical History:  Procedure Laterality Date  . chin implant  1996    FAMILY HISTORY:  Family History  Problem Relation Age of Onset  . Breast cancer Mother 33       bilateral  . Atrial fibrillation Father   . Breast cancer Maternal Grandmother 60  . Breast cancer Paternal Grandmother 76  . Breast cancer Maternal Aunt 67  . Prostate cancer Maternal Uncle 68  . Stomach cancer Paternal Grandfather 60  . Heart Problems Maternal Aunt     SOCIAL HISTORY:  Social History   Socioeconomic History  . Marital status: Married    Spouse name: Not on file  . Number of children: Not on file  . Years of education: Not on file  . Highest education level: Not on file  Occupational History  . Not on file  Tobacco Use  . Smoking status: Never Smoker  . Smokeless tobacco: Never Used  Substance and Sexual Activity  . Alcohol use: Yes    Comment: Socially  . Drug use: Never  . Sexual activity: Not  on file  Other Topics Concern  . Not on file  Social History Narrative  . Not on file   Social Determinants of Health   Financial Resource Strain: Not on file  Food Insecurity: Not on file  Transportation Needs: Not on file  Physical Activity: Not on file  Stress: Not on file  Social Connections: Not on file    ALLERGIES:  Allergies  Allergen Reactions  . Caffeine Palpitations    MEDICATIONS:  Current  Outpatient Medications  Medication Sig Dispense Refill  . ALPRAZolam (XANAX) 0.25 MG tablet Take 0.25 mg by mouth as needed.     . colestipol (COLESTID) 1 g tablet Take 1 g by mouth 2 (two) times daily.    Marland Kitchen dexamethasone (DECADRON) 4 MG tablet Take 1 tablet (4 mg total) by mouth 2 (two) times daily. Take 1 tablet day before chemo and 1 tablet day after chemo with food 12 tablet 0  . levothyroxine (SYNTHROID) 75 MCG tablet Take 75 mcg by mouth daily.     Marland Kitchen lidocaine-prilocaine (EMLA) cream Apply to affected area once 30 g 3  . metoprolol succinate (TOPROL-XL) 25 MG 24 hr tablet TAKE ONE TABLET BY MOUTH DAILY WITH OR IMMEDIATELY FOLLOWING A MEAL 90 tablet 1  . metoprolol tartrate (LOPRESSOR) 25 MG tablet Take 25 mg by mouth as needed.    . ondansetron (ZOFRAN) 8 MG tablet Take 1 tablet (8 mg total) by mouth 2 (two) times daily as needed (Nausea or vomiting). Start on the third day after chemotherapy. 30 tablet 1  . prochlorperazine (COMPAZINE) 10 MG tablet Take 1 tablet (10 mg total) by mouth every 6 (six) hours as needed (Nausea or vomiting). 30 tablet 1   No current facility-administered medications for this encounter.    REVIEW OF SYSTEMS: A 10+ POINT REVIEW OF SYSTEMS WAS OBTAINED including neurology, dermatology, psychiatry, cardiac, respiratory, lymph, extremities, GI, GU, musculoskeletal, constitutional, reproductive, HEENT. On the provided form, she reports no pain within the breast nipple discharge or bleeding prior to diagnosis. She denies swelling in her left arm or hand and any other symptoms.    PHYSICAL EXAM:  Vitals:   11/11/20 1319  BP: (!) 144/78  Pulse: 81  Resp: 18  Temp: 97.7 F (36.5 C)  SpO2: 100%    Lungs are clear to auscultation bilaterally. Heart has regular rate and rhythm. No palpable cervical, supraclavicular, or axillary (right) adenopathy. Abdomen soft, non-tender, normal bowel sounds. Right breast with no palpable mass, nipple discharge, or bleeding.   Left breast with biopsy site and some bruising in the lower outer quadrant.  Palpable induration estimated to be approximately 1.5 cm..  Palpable adenopathy in the left axillary region measuring approximately 2 to 3 cm in size.  Not fixed to surrounding structures  KPS = 100  100 - Normal; no complaints; no evidence of disease. 90   - Able to carry on normal activity; minor signs or symptoms of disease. 80   - Normal activity with effort; some signs or symptoms of disease. 33   - Cares for self; unable to carry on normal activity or to do active work. 60   - Requires occasional assistance, but is able to care for most of his personal needs. 50   - Requires considerable assistance and frequent medical care. 25   - Disabled; requires special care and assistance. 40   - Severely disabled; hospital admission is indicated although death not imminent. 25   - Very sick; hospital admission necessary;  active supportive treatment necessary. 10   - Moribund; fatal processes progressing rapidly. 0     - Dead  Karnofsky DA, Abelmann Anna, Craver LS and Burchenal JH [phone]) The use of the nitrogen mustards in the palliative treatment of carcinoma: with particular reference to bronchogenic carcinoma Cancer 1 634-56  LABORATORY DATA:  Lab Results  Component Value Date   WBC 8.7 11/11/2020   HGB 13.1 11/11/2020   HCT 40.6 11/11/2020   MCV 98.3 11/11/2020   PLT 264 11/11/2020   Lab Results  Component Value Date   NA 138 11/11/2020   K 4.4 11/11/2020   CL 103 11/11/2020   CO2 25 11/11/2020   Lab Results  Component Value Date   ALT 14 11/11/2020   AST 14 (L) 11/11/2020   ALKPHOS 69 11/11/2020   BILITOT 0.4 11/11/2020    PULMONARY FUNCTION TEST:   Recent Review Flowsheet Data   There is no flowsheet data to display.     RADIOGRAPHY: MR BREAST BILATERAL W WO CONTRAST INC CAD  Result Date: 11/09/2020 CLINICAL DATA:  Biopsy proven invasive ductal carcinoma far posteriorly in the 3:30 region  of the left breast and metastatic 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 LABS:  None obtained at the time of imaging. EXAM: BILATERAL BREAST MRI WITH AND WITHOUT CONTRAST TECHNIQUE: Multiplanar, multisequence MR images of both breasts were obtained prior to and following the intravenous administration of 8 ml of Gadavist Three-dimensional MR images were rendered by post-processing of the original MR data on an independent workstation. The three-dimensional MR images were interpreted, and findings are reported in the following complete MRI report for this study. Three dimensional images were evaluated at the independent interpreting workstation using the DynaCAD thin client. COMPARISON:  Previous exam(s). FINDINGS: Breast composition: c. Heterogeneous fibroglandular tissue. Background parenchymal enhancement: Moderate. Right breast: No mass or abnormal enhancement. Left breast: Far posteriorly in the lower outer quadrant of the left breast is an irregular enhancing mass measuring 1.5 x 1.0 x 1.3 cm in anterior-posterior, transverse and craniocaudal dimensions (series 14, image 222). Signal void artifact is seen in the mass from the biopsy clip. Lymph nodes: There are 2 enhancing, enlarged abnormal lymph nodes in the left axilla measuring 1.6 and 2.1 cm (series 8, image 124). The more anterior lymph node has a signal void artifact along the posterior margin from the biopsy clip. Ancillary findings:  None. IMPRESSION: 1.5 cm irregular enhancing mass in the lower outer quadrant of the left breast corresponding with the biopsy proven invasive ductal carcinoma. Two enlarged abnormal left axillary lymph nodes corresponding with the axillary metastasis. RECOMMENDATION: Treatment planning of the known left breast invasive ductal carcinoma with metastatic axillary adenopathy is recommended. No abnormal enhancement in the right breast. BI-RADS CATEGORY  6: Known biopsy-proven malignancy. Electronically Signed   By: Lillia Mountain M.D.    On: 11/09/2020 13:07   US BREAST LTD UNI LEFT INC AXILLA  Result Date: 11/02/2020 CLINICAL DATA:  55 year old female recalled from screening mammogram dated 10/14/2020 for a possible left breast mass and enlarged axillary lymph nodes. EXAM: DIGITAL DIAGNOSTIC UNILATERAL LEFT MAMMOGRAM WITH TOMOSYNTHESIS AND CAD; ULTRASOUND LEFT BREAST LIMITED TECHNIQUE: Left digital diagnostic mammography and breast tomosynthesis was performed. The images were evaluated with computer-aided detection.; Targeted ultrasound examination of the left breast was performed COMPARISON:  Previous exam(s). ACR Breast Density Category c: The breast tissue is heterogeneously dense, which may obscure small masses. FINDINGS: There is a persistent, partially visualized spiculated mass in the deep central  left breast. Additionally at least 4 morphologically abnormal lymph nodes are noted in the left axilla. Targeted ultrasound is performed, showing an irregular hypoechoic mass with associated vascularity at the deep 3:30 position 9 cm from the nipple. It measures 1.4 x 1.2 x 0.9 cm. This correlates with the mammographic finding. Evaluation of the left axilla demonstrates 2 morphologically abnormal lymph nodes with diffuse cortical thickening up to 8 mm. IMPRESSION: 1. Suspicious left breast mass at the 3:30 position. Recommendation is for ultrasound-guided biopsy. 2. Suspicious left axillary lymphadenopathy. Recommendation is for ultrasound-guided biopsy. RECOMMENDATION: Two area ultrasound-guided biopsy of the left breast and left axilla. I have discussed the findings and recommendations with the patient. If applicable, a reminder letter will be sent to the patient regarding the next appointment. BI-RADS CATEGORY  5: Highly suggestive of malignancy. Electronically Signed   By: Kristopher Oppenheim M.D.   On: 11/02/2020 12:57   MM DIAG BREAST TOMO UNI LEFT  Result Date: 11/02/2020 CLINICAL DATA:  55 year old female recalled from screening  mammogram dated 10/14/2020 for a possible left breast mass and enlarged axillary lymph nodes. EXAM: DIGITAL DIAGNOSTIC UNILATERAL LEFT MAMMOGRAM WITH TOMOSYNTHESIS AND CAD; ULTRASOUND LEFT BREAST LIMITED TECHNIQUE: Left digital diagnostic mammography and breast tomosynthesis was performed. The images were evaluated with computer-aided detection.; Targeted ultrasound examination of the left breast was performed COMPARISON:  Previous exam(s). ACR Breast Density Category c: The breast tissue is heterogeneously dense, which may obscure small masses. FINDINGS: There is a persistent, partially visualized spiculated mass in the deep central left breast. Additionally at least 4 morphologically abnormal lymph nodes are noted in the left axilla. Targeted ultrasound is performed, showing an irregular hypoechoic mass with associated vascularity at the deep 3:30 position 9 cm from the nipple. It measures 1.4 x 1.2 x 0.9 cm. This correlates with the mammographic finding. Evaluation of the left axilla demonstrates 2 morphologically abnormal lymph nodes with diffuse cortical thickening up to 8 mm. IMPRESSION: 1. Suspicious left breast mass at the 3:30 position. Recommendation is for ultrasound-guided biopsy. 2. Suspicious left axillary lymphadenopathy. Recommendation is for ultrasound-guided biopsy. RECOMMENDATION: Two area ultrasound-guided biopsy of the left breast and left axilla. I have discussed the findings and recommendations with the patient. If applicable, a reminder letter will be sent to the patient regarding the next appointment. BI-RADS CATEGORY  5: Highly suggestive of malignancy. Electronically Signed   By: Kristopher Oppenheim M.D.   On: 11/02/2020 12:57   [place] AXILLARY NODE CORE BIOPSY LEFT  Addendum Date: 11/09/2020   ADDENDUM REPORT: 11/05/2020 12:49 ADDENDUM: Pathology revealed GRADE III INVASIVE DUCTAL CARCINOMA of the Left breast, 3:30 o'clock. This was found to be concordant by Dr. Lillia Mountain. Pathology revealed  METASTATIC CARCINOMA of the Left axilla. This was found to be concordant by Dr. Lillia Mountain. Pathology results were discussed with the patient by telephone. The patient reported doing well after the biopsies with tenderness at the sites. Post biopsy instructions and care were reviewed and questions were answered. The patient was encouraged to call [facility] for any additional concerns. My direct phone number was provided. The patient was referred to [facility] on November 11, 2020. Pathology results reported by Terie Purser, RN on 11/05/2020. Electronically Signed   By: Lillia Mountain M.D.   On: 11/05/2020 12:49   Result Date: 11/09/2020 CLINICAL DATA:  Suspicious left breast mass and axillary adenopathy. EXAM: ULTRASOUND GUIDED LEFT BREAST CORE NEEDLE  BIOPSIES COMPARISON:  Previous exam(s). PROCEDURE: I met with the patient and we discussed the procedure of ultrasound-guided biopsy, including benefits and alternatives. We discussed the high likelihood of a successful procedure. We discussed the risks of the procedure, including infection, bleeding, tissue injury, clip migration, and inadequate sampling. Informed written consent was given. The usual time-out protocol was performed immediately prior to the procedure. Lesion quadrant: 3:30 Using sterile technique and 1% Lidocaine as local anesthetic, under direct ultrasound visualization, a 14 gauge spring-loaded device was used to perform biopsy of a mass in the 3:30 of the left breast using a lateral to medial approach. At the conclusion of the procedure ribbon shaped tissue marker clip was deployed into the biopsy cavity. Follow up 2 view mammogram was performed and dictated separately. I met with the patient and we discussed the procedure of ultrasound-guided biopsy, including benefits and alternatives. We discussed the high likelihood of a successful procedure. We  discussed the risks of the procedure, including infection, bleeding, tissue injury, clip migration, and inadequate sampling. Informed written consent was given. The usual time-out protocol was performed immediately prior to the procedure. Lesion quadrant: Left axilla Using sterile technique and 1% Lidocaine as local anesthetic, under direct ultrasound visualization, a 14 gauge spring-loaded device was used to perform biopsy of a left axillary lymph node using a lateral to medial approach. At the conclusion of the procedure tri bell tissue marker clip was deployed into the biopsy cavity. Follow up 2 view mammogram was performed and dictated separately. IMPRESSION: Ultrasound guided biopsies of a mass in the 3:30 region of the left breast and a left axillary lymph node. No apparent complications. Electronically Signed: By: Lillia Mountain M.D. On: 11/04/2020 11:32   MM CLIP PLACEMENT LEFT  Result Date: 11/04/2020 CLINICAL DATA:  Status post ultrasound-guided core biopsies of a mass in the 3:30 region of the left breast and a left axillary lymph node. EXAM: DIAGNOSTIC LEFT MAMMOGRAM POST ULTRASOUND BIOPSIES COMPARISON:  Previous exam(s). FINDINGS: Mammographic images were obtained following ultrasound guided core biopsies of a mass in the 3:30 region of the left breast and a left axillary lymph node. The ribbon shaped clip in the mass in the 3:30 region of the left breast and the tri bell clip in the left axilla are in appropriate position. IMPRESSION: Appropriate positioning of the ribbon shaped clip in the mass in the 3:30 region of the left breast and the tri bell clip in the left axilla. Final Assessment: Post Procedure Mammograms for Marker Placement Electronically Signed   By: Lillia Mountain M.D.   On: 11/04/2020 11:58   [place] LT BREAST BX W LOC DEV 1ST LESION IMG BX SPEC US GUIDE  Addendum Date: 11/09/2020   ADDENDUM REPORT: 11/05/2020 12:49 ADDENDUM: Pathology revealed GRADE III INVASIVE DUCTAL CARCINOMA of the  Left breast, 3:30 o'clock. This was found to be concordant by Dr. Lillia Mountain. Pathology revealed METASTATIC CARCINOMA of the Left axilla. This was found to be concordant by Dr. Lillia Mountain. Pathology results were discussed with the patient by telephone. The patient reported doing well after the biopsies with tenderness at the sites. Post biopsy instructions and care were reviewed and questions were answered. The patient was encouraged to call [facility] for any additional concerns. My direct phone number was provided. The patient was referred to [facility] at [facility] on November 11, 2020. Pathology results reported by Terie Purser, RN on 11/05/2020. Electronically  Signed   By: Lillia Mountain M.D.   On: 11/05/2020 12:49   Result Date: 11/09/2020 CLINICAL DATA:  Suspicious left breast mass and axillary adenopathy. EXAM: ULTRASOUND GUIDED LEFT BREAST CORE NEEDLE BIOPSIES COMPARISON:  Previous exam(s). PROCEDURE: I met with the patient and we discussed the procedure of ultrasound-guided biopsy, including benefits and alternatives. We discussed the high likelihood of a successful procedure. We discussed the risks of the procedure, including infection, bleeding, tissue injury, clip migration, and inadequate sampling. Informed written consent was given. The usual time-out protocol was performed immediately prior to the procedure. Lesion quadrant: 3:30 Using sterile technique and 1% Lidocaine as local anesthetic, under direct ultrasound visualization, a 14 gauge spring-loaded device was used to perform biopsy of a mass in the 3:30 of the left breast using a lateral to medial approach. At the conclusion of the procedure ribbon shaped tissue marker clip was deployed into the biopsy cavity. Follow up 2 view mammogram was performed and dictated separately. I met with the patient and we discussed the procedure of ultrasound-guided biopsy,  including benefits and alternatives. We discussed the high likelihood of a successful procedure. We discussed the risks of the procedure, including infection, bleeding, tissue injury, clip migration, and inadequate sampling. Informed written consent was given. The usual time-out protocol was performed immediately prior to the procedure. Lesion quadrant: Left axilla Using sterile technique and 1% Lidocaine as local anesthetic, under direct ultrasound visualization, a 14 gauge spring-loaded device was used to perform biopsy of a left axillary lymph node using a lateral to medial approach. At the conclusion of the procedure tri bell tissue marker clip was deployed into the biopsy cavity. Follow up 2 view mammogram was performed and dictated separately. IMPRESSION: Ultrasound guided biopsies of a mass in the 3:30 region of the left breast and a left axillary lymph node. No apparent complications. Electronically Signed: By: Lillia Mountain M.D. On: 11/04/2020 11:32      IMPRESSION: Stage II-A  Left Breast LOQ, Invasive Ductal Carcinoma, ER- / PR- / Her2+, Grade 3  The patient will be a good candidate for breast conservation with radiotherapy to the left breast. We discussed the general course of radiation, potential side effects, and toxicities with radiation and the patient is interested in this approach.  In light of the documented biopsy-proven lymphadenopathy would also recommend elective coverage of the axillary region after her axillary node dissection.   PLAN: MRI  1. Neoadjuvant chemotherapy with TCH Perjeta 6 cycles followed by Herceptin Perjeta maintenance versus Kadcyla maintenance (based on response to neoadjuvant chemo) for 1 year 2. Followed by breast conserving surgery with ALND 3. Followed by adjuvant radiation therapy 4.  Consideration for neratinib ------------------------------------------------  Kristina Promise, PhD, MD  This document serves as a record of services personally performed  by Kristina Pray, MD. It was created on his behalf by Clerance Lav, a trained medical scribe. The creation of this record is based on the scribe's personal observations and the provider's statements to them. This document has been checked and approved by the attending provider.

## 2020-11-11 NOTE — Research (Signed)
[place] [address]: TREATMENT OF REFRACTORY NAUSEA  11/11/20     3:50PM  INTRODUCTION: Patient Kristina Harvey was identified by Dr. Lindi Adie as a potential candidate for the above listed study.  This Clinical Research Coordinator met with [name], [id number], on 11/11/20 in a manner and location that ensures patient privacy to discuss participation in the above listed research study.  Patient is Accompanied by her husband.  A copy of the informed consent document and separate HIPAA Authorization (consent version dated 03/01/2019, [facility] Activation Date 12/10/2019; HIPPA dated 10/31/2016) was provided to the patient. Patient reads, speaks, and understands [place].   Patient was provided with the business card of this Coordinator and encouraged to contact the research team with any questions.  Approximately 15 minutes were spent with the patient reviewing the informed consent documents.  Patient was provided the option of taking informed consent documents home to review and was encouraged to review at their convenience with their support network, including other care providers. Patient took the consent documents home to review. Patient was thanked for her time.  PLAN: Eligibility will be reviewed and this clinical research coordinator will call patient to discuss interest.   Carol Ada, RT(R)(T) Clinical Research Coordinator  11/17/2020 PHONE CALL: Confirmed I was speaking with [name]. Called patient to inform her [id number] is closed for stage I-II patients. Also confirmed if patient regularly takes Alprazolam. Patient states PRN usage, but the exact number is unkonwn. After discussion, it was felt best for patient to continue medication as prescribed and as she is accustomed to taking it and patient not participate in the above mentioned study. Patient was thanked for her time and the opportunity to speak with her.   Carol Ada, RT(R)(T) Clinical Research Coordinator

## 2020-11-11 NOTE — Therapy (Signed)
[place], [place], [address] Phone: [phone]   Fax:  [phone]  Physical Therapy Evaluation  Patient Details  Name: Kristina Harvey MRN: [id number] Date of Birth: 11-26-1965 Referring Provider (PT): Dr. Autumn Messing   Encounter Date: 11/11/2020   PT End of Session - 11/11/20 1441    Visit Number 1    Number of Visits 2    Date for PT Re-Evaluation 05/14/21    PT Start Time 6301    PT Stop Time 6010   Also saw pt from 1405-1425 for a total of 35 min   PT Time Calculation (min) 15 min    Activity Tolerance Patient tolerated treatment well    Behavior During Therapy [facility] for tasks assessed/performed           Past Medical History:  Diagnosis Date  . Arrhythmia   . Hyperlipidemia   . Thyroid disease     Past Surgical History:  Procedure Laterality Date  . chin implant  1996    There were no vitals filed for this visit.    Subjective Assessment - 11/11/20 1432    Subjective Patient reports she is here today to be seen by her medical team for her newly diagnosed left breast cancer.    Patient is accompained by: Family member    Pertinent History Patient was diagnosed on 10/14/2020 with left grade III invasive ductal carcinoma. It measures 1.4 cm and is located in the lower outer quadrant. It is ER/PR negative and HER2 positive with a Ki67 of 20%. She has 4 abnormal appearing lymph nodes and 1 biopsied and positive.    Patient Stated Goals Reduce lymphedema risk and learn post op shoulder ROM HEP    Currently in Pain? No/denies              [facility] PT Assessment - 11/11/20 0001      Assessment   Medical Diagnosis Left breast cancer    Referring Provider (PT) Dr. Autumn Messing    Onset Date/Surgical Date 10/14/20    Hand Dominance Right    Prior Therapy none      Precautions   Precautions Other (comment)    Precaution Comments active cancer      Restrictions   Weight Bearing Restrictions  No      Balance Screen   Has the patient fallen in the past 6 months No    Has the patient had a decrease in activity level because of a fear of falling?  No    Is the patient reluctant to leave their home because of a fear of falling?  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    Leisure She walks 3x/week for 45 minutes      Cognition   Overall Cognitive Status Within Functional Limits for tasks assessed      Posture/Postural Control   Posture/Postural Control Postural limitations    Postural Limitations Rounded Shoulders;Forward head      ROM / Strength   AROM / PROM / Strength AROM;Strength      AROM   Overall AROM Comments Cervical AROM is WNL    AROM Assessment Site Shoulder    Right/Left  Shoulder Right;Left    Right Shoulder Extension 54 Degrees    Right Shoulder Flexion 153 Degrees    Right Shoulder ABduction 153 Degrees    Right Shoulder Internal Rotation 66 Degrees    Right Shoulder External Rotation 80 Degrees    Left Shoulder Extension 60 Degrees    Left Shoulder Flexion 142 Degrees    Left Shoulder ABduction 162 Degrees    Left Shoulder Internal Rotation 67 Degrees    Left Shoulder External Rotation 78 Degrees      Strength   Overall Strength Within functional limits for tasks performed             LYMPHEDEMA/ONCOLOGY QUESTIONNAIRE - 11/11/20 0001      Type   Cancer Type Left breast cancer      Lymphedema Assessments   Lymphedema Assessments Upper extremities      Right Upper Extremity Lymphedema   10 cm Proximal to Olecranon Process 27.9 cm    Olecranon Process 24.8 cm    10 cm Proximal to Ulnar Styloid Process 22 cm    Just Proximal to Ulnar Styloid Process 15.5 cm    Across Hand at [employer] 18.5 cm     At [place] of 2nd Digit 6.1 cm      Left Upper Extremity Lymphedema   10 cm Proximal to Olecranon Process 26.9 cm    Olecranon Process 24.2 cm    10 cm Proximal to Ulnar Styloid Process 21.2 cm    Just Proximal to Ulnar Styloid Process 15.1 cm    Across Hand at [employer] 17.9 cm    At [place] of 2nd Digit 6 cm           L-DEX FLOWSHEETS - 11/11/20 1400      L-DEX LYMPHEDEMA SCREENING   Measurement Type Unilateral    L-DEX MEASUREMENT EXTREMITY Upper Extremity    POSITION  Standing    DOMINANT SIDE Right    At Risk Side Left    BASELINE SCORE (UNILATERAL) -7.3           The patient was assessed using the L-Dex machine today to produce a lymphedema index baseline score. The patient will be reassessed on a regular basis (typically every 3 months) to obtain new L-Dex scores. If the score is > 6.5 points away from his/her baseline score indicating onset of subclinical lymphedema, it will be recommended to wear a compression garment for 4 weeks, 12 hours per day and then be reassessed. If the score continues to be > 6.5 points from baseline at reassessment, we will initiate lymphedema treatment. Assessing in this manner has a 95% rate of preventing clinically significant lymphedema.      [name] - 11/11/20 0001    Open a tight or new jar No difficulty    Do heavy household chores (wash walls, wash floors) No difficulty    Carry a shopping bag or briefcase No difficulty    Wash your back No difficulty    Use a knife to cut food No difficulty    Recreational activities in which you take some force or impact through your arm, shoulder, or hand (golf, hammering, tennis) No difficulty    During the past week, to what extent has your arm, shoulder or hand problem interfered with your normal social activities with family, friends, neighbors, or groups? Not at all    During the past week, to what extent has your arm, shoulder or hand problem limited your work  or other regular daily  activities Not at all    Arm, shoulder, or hand pain. Mild    Tingling (pins and needles) in your arm, shoulder, or hand None    Difficulty Sleeping Mild difficulty    DASH Score 4.55 %            Objective measurements completed on examination: See above findings.        Patient was instructed today in a home exercise program today for post op shoulder range of motion. These included active assist shoulder flexion in sitting, scapular retraction, wall walking with shoulder abduction, and hands behind head external rotation.  She was encouraged to do these twice a day, holding 3 seconds and repeating 5 times when permitted by her physician.           PT Education - 11/11/20 1440    Education Details Lymphedema risk reduction and post op shoulder ROM HEP    Person(s) Educated Patient;Spouse    Methods Explanation;Demonstration;Handout    Comprehension Verbalized understanding;Returned demonstration               PT Long Term Goals - 11/11/20 1546      PT LONG TERM GOAL #1   Title Patient will demonstrate she has regained full shoulder ROM and function post operatively compared to baselines.    Time 6    Period Months    Status New    Target Date 05/14/21           Breast Clinic Goals - 11/11/20 1544      Patient will be able to verbalize understanding of pertinent lymphedema risk reduction practices relevant to her diagnosis specifically related to skin care.   Time 1    Period Days    Status Achieved      Patient will be able to return demonstrate and/or verbalize understanding of the post-op home exercise program related to regaining shoulder range of motion.   Time 1    Period Days    Status Achieved      Patient will be able to verbalize understanding of the importance of attending the postoperative After Breast Cancer Class for further lymphedema risk reduction education and therapeutic exercise.   Time 1    Period Days    Status Achieved                  Plan - 11/11/20 1442    Clinical Impression Statement Patient was diagnosed on 10/14/2020 with left grade III invasive ductal carcinoma. It measures 1.4 cm and is located in the lower outer quadrant. It is ER/PR negative and HER2 positive with a Ki67 of 20%. She has 4 abnormal appearing lymph nodes and 1 biopsied and positive. Her multidisciplinary medical team met prior to her assessments to determine a recommended treatment plan. She is planning to have neoadjuvant chemotherapy followed by a left lumpectomy and axillary lymph node dissection and radiation. She will benefit from a post op PT reassessment to determine needs and from L-Dex screens every 3 months for 2 yers to detect subclinical lymphedema.    Stability/Clinical Decision Making Stable/Uncomplicated    Clinical Decision Making Low    Rehab Potential Excellent    PT Frequency --   Eval nd 1 f/u visit   PT Treatment/Interventions ADLs/Self Care Home Management;Therapeutic exercise;Patient/family education    PT Next Visit Plan Will reassess 3-4 weeks post op    PT Home Exercise Plan Post op shoulder ROM HEP  Consulted and Agree with Plan of Care Patient;Family member/caregiver    Family Member Consulted Husband           Patient will benefit from skilled therapeutic intervention in order to improve the following deficits and impairments:  Postural dysfunction,Decreased range of motion,Decreased knowledge of precautions,Impaired UE functional use,Pain  Visit Diagnosis: Malignant neoplasm of lower-outer quadrant of left breast of female, estrogen receptor negative ([place]) - Plan: PT plan of care cert/re-cert  Abnormal posture - Plan: PT plan of care cert/re-cert   Patient will follow up at outpatient cancer rehab 3-4 weeks following surgery.  If the patient requires physical therapy at that time, a specific plan will be dictated and sent to the referring physician for approval. The patient was educated today on  appropriate basic range of motion exercises to begin post operatively and the importance of attending the After Breast Cancer class following surgery.  Patient was educated today on lymphedema risk reduction practices as it pertains to recommendations that will benefit the patient immediately following surgery.  She verbalized good understanding.       Problem List Patient Active Problem List   Diagnosis Date Noted  . Malignant neoplasm of lower-outer quadrant of left breast of female, estrogen receptor negative ([place]) 11/09/2020   Annia Friendly, PT 11/11/20 3:51 PM  [place], [place], [address] Phone: [phone]   Fax:  [phone]  Name: TARANEH METHENEY MRN: [id number] Date of Birth: 02-27-1966

## 2020-11-11 NOTE — Patient Instructions (Signed)

## 2020-11-11 NOTE — Progress Notes (Signed)
[place] Psychosocial Distress Screening Counseling Intern  Counseling intern was referred by distress screening protocol.  The patient scored a 7 on the Psychosocial Distress Thermometer which indicates severe distress. Counseling intern met with patient in exam room" to assess for distress and other psychosocial needs. The patient attended clinic with her husband and said her current distress was up to a 9. Intern asked about how they were doing and patient said "don't make [place] cry." The patient appeared distressed, but did not share much. She has good support and a strong faith. This patient likely needs more time to process her experience.   ON[facility]N DISTRESS SCREENING 11/11/2020  Screening Type Initial Screening  Distress experienced in past week (1-10) 7  Emotional problem type Nervousness/Anxiety  Spiritual/Religous concerns type Facing my mortality  Physical Problem type Sleep/insomnia  Referral to support programs Yes    Follow up needed: No.   Gaylyn Rong Counseling Intern

## 2020-11-11 NOTE — Assessment & Plan Note (Signed)
11/04/2020:Screening mammogram showed a possible left breast mass and enlarged axillary lymph nodes. Diagnostic mammogram and US showed a 1.4cm mass at the 3:30 position in the left breast and 2 abnormal lymph nodes in the left axilla. 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  Pathology and radiology counseling: Discussed with the patient, the details of pathology including the type of breast cancer,the clinical staging, the significance of ER, PR and HER-2/neu receptors and the implications for treatment. After reviewing the pathology in detail, we proceeded to discuss the different treatment options between surgery, radiation, chemotherapy, antiestrogen therapies.  Recommendation based on multidisciplinary tumor board: 1. Neoadjuvant chemotherapy with TCH Perjeta 6 cycles followed by Herceptin Perjeta maintenance versus Kadcyla maintenance (based on response to neoadjuvant chemo) for 1 year 2. Followed by breast conserving surgery if possible with targeted node dissection versus ALND 3. Followed by adjuvant radiation therapy 4.  Consideration for neratinib  Chemotherapy Counseling: I discussed the risks and benefits of chemotherapy including the risks of nausea/ vomiting, risk of infection from low WBC count, fatigue due to chemo or anemia, bruising or bleeding due to low platelets, mouth sores, loss/ change in taste and decreased appetite. Liver and kidney function will be monitored through out chemotherapy as abnormalities in liver and kidney function may be a side effect of treatment. Cardiac dysfunction due to Herceptin and Perjeta and neuropathy risk from Taxotere were discussed in detail. Risk of permanent bone marrow dysfunction due to chemo were also discussed.  Plan: 1. Port placement 2. Echocardiogram 3. Chemotherapy class 4. Breast MRI 5. URCC breast nausea study  URCC 16070: Treatment of  refractory nausea.  After first cycle of chemo if patient experience chemo induced nausea and vomiting the randomized from cycle 2 to Aloxi plus Dex plus olanzapine or placebo plus Compazine or placebo plus placebo prior to chemo and take home medications for day 2 today for of Dex plus olanzapine or placebo and Compazine or placebo every 8 hours.  If patient does not have nausea after cycle 1, then the trial is complete.  Return to clinic in 2 weeks to start chemotherapy.

## 2020-11-12 ENCOUNTER — Encounter: Payer: Self-pay | Admitting: Genetic Counselor

## 2020-11-12 ENCOUNTER — Telehealth: Payer: Self-pay | Admitting: Genetic Counselor

## 2020-11-12 DIAGNOSIS — Z803 Family history of malignant neoplasm of breast: Secondary | ICD-10-CM | POA: Insufficient documentation

## 2020-11-12 DIAGNOSIS — Z800 Family history of malignant neoplasm of digestive organs: Secondary | ICD-10-CM | POA: Insufficient documentation

## 2020-11-12 DIAGNOSIS — Z8042 Family history of malignant neoplasm of prostate: Secondary | ICD-10-CM | POA: Insufficient documentation

## 2020-11-12 NOTE — Progress Notes (Signed)
REFERRING PROVIDER: Nicholas Lose, MD [address] [place],  [place] [address]  PRIMARY PROVIDER:  Alroy Dust, Carlean Jews.Marlou Sa, MD  PRIMARY REASON FOR VISIT:  1. Malignant neoplasm of lower-outer quadrant of left breast of female, estrogen receptor negative ([place])   2. Family history of breast cancer   3. Family history of prostate cancer   4. Family history of stomach cancer       HISTORY OF PRESENT ILLNESS:   Kristina Harvey, a 55 y.o. female, was seen for a [facility] cancer genetics consultation at the request of Dr. Lindi Adie due to a personal and family history of cancer.  Kristina Harvey presents to clinic today to discuss the possibility of a hereditary predisposition to cancer, genetic testing, and to further clarify her future cancer risks, as well as potential cancer risks for family members.   In March of 2022, at the age of 40, Kristina Harvey was diagnosed with invasive ductal carcinoma of the left breast. The tumor is ER-/PR-/Her2+. The treatment plan includes chemotherapy, surgery, and radiation therapy.    CANCER HISTORY:  Oncology History  Malignant neoplasm of lower-outer quadrant of left breast of female, estrogen receptor negative ([name])  11/04/2020 Initial Diagnosis   Screening mammogram showed a possible left breast mass and enlarged axillary lymph nodes. Diagnostic mammogram and US showed a 1.4cm mass at the 3:30 position in the left breast and 2 abnormal lymph nodes in the left axilla. 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   11/11/2020 Cancer Staging   Staging form: Breast, AJCC 8th Edition - Clinical stage from 11/11/2020: Stage IIA (cT1c, cN1, cM0, G3, ER-, PR-, HER2+) - Signed by Nicholas Lose, MD on 11/11/2020 Stage prefix: Initial diagnosis Histologic grading system: 3 grade system Laterality: Left Staged by: Pathologist and managing physician Stage used in treatment planning:  Yes National guidelines used in treatment planning: Yes Type of national guideline used in treatment planning: NCCN   11/25/2020 -  Chemotherapy    Patient is on Treatment Plan: BREAST  DOCETAXEL + CARBOPLATIN + TRASTUZUMAB + PERTUZUMAB  (TCHP) Q21D         RISK FACTORS:  Menarche was at age 31.  No live births.  OCP use for approximately 28 years.  Menopausal status: postmenopausal.  HRT use: 0 years. Colonoscopy: yes. Mammogram within the last year: yes.   Past Medical History:  Diagnosis Date  . Arrhythmia   . Family history of breast cancer   . Family history of prostate cancer   . Family history of stomach cancer   . Hyperlipidemia   . Thyroid disease     Past Surgical History:  Procedure Laterality Date  . chin implant  1996    Social History   Socioeconomic History  . Marital status: Married    Spouse name: Not on file  . Number of children: Not on file  . Years of education: Not on file  . Highest education level: Not on file  Occupational History  . Not on file  Tobacco Use  . Smoking status: Never Smoker  . Smokeless tobacco: Never Used  Substance and Sexual Activity  . Alcohol use: Yes    Comment: Socially  . Drug use: Never  . Sexual activity: Not on file  Other Topics Concern  . Not on file  Social History Narrative  . Not on file   Social Determinants of Health   Financial Resource Strain: Not  on file  Food Insecurity: Not on file  Transportation Needs: Not on file  Physical Activity: Not on file  Stress: Not on file  Social Connections: Not on file     FAMILY HISTORY:  We obtained a detailed, 4-generation family history.  Significant diagnoses are listed below: Family History  Problem Relation Age of Onset  . Breast cancer Mother 2       bilateral  . Atrial fibrillation Father   . Breast cancer Maternal Grandmother 60  . Breast cancer Paternal Grandmother 60  . Breast cancer Maternal Aunt 67  . Prostate cancer Maternal  Uncle 68  . Stomach cancer Paternal Grandfather 4  . Heart Problems Maternal Aunt    Kristina Harvey does not have children. She has one brother (age 17) and had one sister (deceased at age 46). Neither of these relatives have had cancer.  Kristina Harvey mother died at age 83 and had a history of bilateral breast cancer diagnosed around age 50. There were two maternal aunts and one maternal uncle. One aunt was diagnosed with breast cancer at age 100. Her uncle was diagnosed with prostate cancer at age 76. There is no known cancer among maternal cousins. Kristina Harvey maternal grandmother died at age 6 and had a history of breast cancer diagnosed at age 52. Her maternal grandfather died at age 40 without cancer.  Kristina Harvey father died at age 14 and may have had stomach cancer, although this was not diagnosed. There were no paternal aunt or uncles. Kristina Harvey paternal grandmother died at age 37 and had a history of breast cancer diagnosed at age 34. Her paternal grandfather died at age 81 with stomach cancer.  Kristina Harvey is unaware of previous family history of genetic testing for hereditary cancer risks. Patient's ancestors are of unknown descent. There is no reported Ashkenazi Jewish ancestry. There is no known consanguinity.  GENETIC COUNSELING ASSESSMENT: [name] is a 55 y.o. female with a personal and family history of breast cancer, as well as a family history of prostate cancer and stomach cancer, which is somewhat suggestive of a hereditary cancer syndrome and predisposition to cancer. We, therefore, discussed and recommended the following at today's visit.   DISCUSSION: We discussed that approximately 5-10% of breast cancer is hereditary, with most cases associated with the BRCA1 and BRCA2 genes. There are other genes that can be associated with hereditary breast cancer syndromes. These include ATM, CHEK2, PALB2, etc. We discussed that testing is beneficial for several reasons, including knowing about other  cancer risks, identifying potential screening and risk-reduction options that may be appropriate, and to understand if other family members could be at risk for cancer and allow them to undergo genetic testing.  We reviewed the characteristics, features and inheritance patterns of hereditary cancer syndromes. We also discussed genetic testing. Ms. Kubicki recalls that she had genetic testing through her gynecologist, Dr. Radene Knee at [facility], approximately 3-6 years ago. She does not remember what the results were, or what was included in the test. We discussed that it will be important to know the results of this genetic test and which genes were tested in order to determine whether she will need additional genetic testing. We will attempt to obtain a copy of her genetic test report, and will follow up with Ms. Kluth to discuss these results and any further recommendations.   PLAN:  We will attempt to obtain a copy of Ms. Castro's previous genetic testing, ordered by Dr. Radene Knee  at [facility]. Once we review her result, we will call Ms. Alper to discuss whether any additional testing is needed. In the meantime, we recommend Ms. Skowronek continue to follow the cancer screening guidelines given by her primary healthcare provider.  Ms. Debruler questions were answered to her satisfaction today. Our contact information was provided should additional questions or concerns arise. Thank you for the referral and allowing [place] to share in the care of your patient.   Clint Guy, [place], [facility] Licensed, Certified Dispensing [email or web address] Phone: [phone]  The patient was seen for a total of 20 minutes in face-to-face genetic counseling.  This patient was discussed with Drs. Magrinat, Lindi Adie and/or Burr Medico who agrees with the above.    _______________________________________________________________________ For Office Staff:  Number of people involved in session: 1 Was an Intern/  student involved with case: no

## 2020-11-12 NOTE — Telephone Encounter (Signed)
Reviewed Kristina Harvey's genetic test result, which was previously completed in February of 2019 through the [facility] panel offered by [employer]. This genetic testing was negative. Discussed that we do not know why she has breast cancer or why there is cancer in the family. There could be a genetic mutation in the family that [name] did not inherit. There could also be a mutation in a different gene that we are not testing, or our current technology may not be able to detect certain mutations. It will therefore be important for her to stay in contact with genetics to keep up with whether additional testing may be appropriate in the future.

## 2020-11-12 NOTE — Research (Signed)
[facility]: PROCUREMENT OF HUMAN BIOSPECIMENS FOR THE DISCOVERY AND VALIDATION OF BIOMARKERS FOR THE PREDICTION, DIAGNOSIS, AND MANAGEMENT OF DISEASE  11/12/20    4:05PM  INTRODUCTION: Kristina Harvey presented to the clinic for a new patient appointment. This Research officer, political party met with the patient in a private exam room who was accompanied by her husband. The informed consent (version 2, revised 02/18/2020) and HIPAA (version 5, revised 07/31/2019) forms for the [facility]54 specimen collection study were reviewed with patient. Patient took the consent documents home to review.   PLAN: Plan is to speak with patient at a later date to see if she interested in participation.    11/17/2020  PHONE CALL: Confirmed I was speaking with Kristina Harvey. Called patient to inform her [id number] is closed for stage I-II patients.ALL: Confirmed I was speaking with Kristina Harvey. Called patient to inform her [facility] is closed for stage I-II patients.  Carol Ada, (R)(T) Clinical Research Coordinator

## 2020-11-16 ENCOUNTER — Other Ambulatory Visit: Payer: Self-pay | Admitting: *Deleted

## 2020-11-16 ENCOUNTER — Telehealth: Payer: Self-pay | Admitting: *Deleted

## 2020-11-16 DIAGNOSIS — C50512 Malignant neoplasm of lower-outer quadrant of left female breast: Secondary | ICD-10-CM

## 2020-11-16 DIAGNOSIS — Z171 Estrogen receptor negative status [ER-]: Secondary | ICD-10-CM

## 2020-11-16 NOTE — Progress Notes (Signed)
Error

## 2020-11-16 NOTE — Telephone Encounter (Signed)
Spoke to pt concerning [place] from 3.30.22. Denies questions or concerns regarding dx or treatment care plan. Encourage pt to call with needs. Received verbal understanding. msg sent to sx scheduler for update on port placement with DR. Marlou Starks.

## 2020-11-18 ENCOUNTER — Telehealth: Payer: Self-pay | Admitting: *Deleted

## 2020-11-18 ENCOUNTER — Other Ambulatory Visit: Payer: Self-pay | Admitting: *Deleted

## 2020-11-18 DIAGNOSIS — Z171 Estrogen receptor negative status [ER-]: Secondary | ICD-10-CM

## 2020-11-18 DIAGNOSIS — C50512 Malignant neoplasm of lower-outer quadrant of left female breast: Secondary | ICD-10-CM

## 2020-11-18 NOTE — Telephone Encounter (Signed)
Spoke with patient and gave her appointment for IR to place port on 4/12. Patient to arrive at 7am and NPO after midnight and will need a driver. Patient verbalized understanding.

## 2020-11-20 NOTE — Progress Notes (Signed)
Pharmacist Chemotherapy Monitoring - Initial Assessment    Anticipated start date: 11/27/2020   Regimen:  . Are orders appropriate based on the patient's diagnosis, regimen, and cycle? Yes . Does the plan date match the patient's scheduled date? Yes . Is the sequencing of drugs appropriate? Yes . Are the premedications appropriate for the patient's regimen? Yes . Prior Authorization for treatment is: no coverage o If applicable, is the correct biosimilar selected based on the patient's insurance? not applicable  Organ Function and Labs: Marland Kitchen Are dose adjustments needed based on the patient's renal function, hepatic function, or hematologic function? Yes . Are appropriate labs ordered prior to the start of patient's treatment? Yes . Other organ system assessment, if indicated: trastuzumab: Echo/ MUGA and pertuzumab: Echo/ MUGA . The following baseline labs, if indicated, have been ordered: N/A  Dose Assessment: . Are the drug doses appropriate? Yes . Are the following correct: o Drug concentrations Yes o IV fluid compatible with drug Yes o Administration routes Yes o Timing of therapy Yes . If applicable, does the patient have documented access for treatment and/or plans for port-a-cath placement? yes . If applicable, have lifetime cumulative doses been properly documented and assessed? no Lifetime Dose Tracking  No doses have been documented on this patient for the following tracked chemicals: Doxorubicin, Epirubicin, Idarubicin, Daunorubicin, Mitoxantrone, Bleomycin, Oxaliplatin, Carboplatin, Liposomal Doxorubicin  o   Toxicity Monitoring/Prevention: . The patient has the following take home antiemetics prescribed: Ondansetron, Prochlorperazine and Dexamethasone . The patient has the following take home medications prescribed: N/A . Medication allergies and previous infusion related reactions, if applicable, have been reviewed and addressed. No . The patient's current medication list  has been assessed for drug-drug interactions with their chemotherapy regimen. no significant drug-drug interactions were identified on review.  Order Review: . Are the treatment plan orders signed? Yes . Is the patient scheduled to see a provider prior to their treatment? Yes  I verify that I have reviewed each item in the above checklist and answered each question accordingly.  Larene Beach, RPH, 11/20/2020  1:49 PM

## 2020-11-23 ENCOUNTER — Other Ambulatory Visit: Payer: Self-pay | Admitting: Radiology

## 2020-11-23 ENCOUNTER — Encounter: Payer: Self-pay | Admitting: *Deleted

## 2020-11-24 ENCOUNTER — Other Ambulatory Visit: Payer: Self-pay | Admitting: Pharmacist

## 2020-11-24 ENCOUNTER — Other Ambulatory Visit: Payer: Self-pay

## 2020-11-24 ENCOUNTER — Ambulatory Visit (HOSPITAL_COMMUNITY)
Admission: RE | Admit: 2020-11-24 | Discharge: 2020-11-24 | Disposition: A | Discharge status: Home / Self Care | Payer: Managed Care, Other (non HMO) | Source: Ambulatory Visit | Attending: Hematology and Oncology | Admitting: Hematology and Oncology

## 2020-11-24 ENCOUNTER — Other Ambulatory Visit: Payer: Self-pay | Admitting: *Deleted

## 2020-11-24 ENCOUNTER — Telehealth: Payer: Self-pay | Admitting: Hematology and Oncology

## 2020-11-24 ENCOUNTER — Encounter (HOSPITAL_COMMUNITY): Payer: Self-pay

## 2020-11-24 DIAGNOSIS — Z79899 Other long term (current) drug therapy: Secondary | ICD-10-CM | POA: Insufficient documentation

## 2020-11-24 DIAGNOSIS — C50512 Malignant neoplasm of lower-outer quadrant of left female breast: Secondary | ICD-10-CM | POA: Diagnosis present

## 2020-11-24 DIAGNOSIS — Z803 Family history of malignant neoplasm of breast: Secondary | ICD-10-CM | POA: Insufficient documentation

## 2020-11-24 DIAGNOSIS — E785 Hyperlipidemia, unspecified: Secondary | ICD-10-CM | POA: Diagnosis not present

## 2020-11-24 DIAGNOSIS — Z8042 Family history of malignant neoplasm of prostate: Secondary | ICD-10-CM | POA: Diagnosis not present

## 2020-11-24 DIAGNOSIS — Z800 Family history of malignant neoplasm of digestive organs: Secondary | ICD-10-CM | POA: Diagnosis not present

## 2020-11-24 DIAGNOSIS — Z171 Estrogen receptor negative status [ER-]: Secondary | ICD-10-CM | POA: Diagnosis not present

## 2020-11-24 DIAGNOSIS — Z79890 Hormone replacement therapy (postmenopausal): Secondary | ICD-10-CM | POA: Diagnosis not present

## 2020-11-24 HISTORY — PX: IR IMAGING GUIDED PORT INSERTION: IMG5740

## 2020-11-24 MED ORDER — LIDOCAINE-EPINEPHRINE 1 %-1:100000 IJ SOLN
INTRAMUSCULAR | Status: AC
  Filled 2020-11-24: qty 1

## 2020-11-24 MED ORDER — LIDOCAINE-EPINEPHRINE 1 %-1:100000 IJ SOLN
INTRAMUSCULAR | Status: AC | PRN
  Administered 2020-11-24: 10 mL

## 2020-11-24 MED ORDER — FENTANYL CITRATE (PF) 100 MCG/2ML IJ SOLN
INTRAMUSCULAR | Status: AC | PRN
  Administered 2020-11-24: 50 ug via INTRAVENOUS

## 2020-11-24 MED ORDER — PEGFILGRASTIM-BMEZ 6 MG/0.6ML ~~LOC~~ SOSY: 6.0000 mg | PREFILLED_SYRINGE | Freq: Once | SUBCUTANEOUS | 4 refills | Status: AC

## 2020-11-24 MED ORDER — SODIUM CHLORIDE 0.9 % IV SOLN
INTRAVENOUS | Status: AC | PRN
  Administered 2020-11-24: 10 mL/h via INTRAVENOUS

## 2020-11-24 MED ORDER — FENTANYL CITRATE (PF) 100 MCG/2ML IJ SOLN
INTRAMUSCULAR | Status: AC
  Filled 2020-11-24: qty 2

## 2020-11-24 MED ORDER — HEPARIN SOD (PORK) LOCK FLUSH 100 UNIT/ML IV SOLN
INTRAVENOUS | Status: AC
  Filled 2020-11-24: qty 5

## 2020-11-24 MED ORDER — MIDAZOLAM HCL 2 MG/2ML IJ SOLN
INTRAMUSCULAR | Status: AC | PRN
  Administered 2020-11-24: 1 mg via INTRAVENOUS

## 2020-11-24 MED ORDER — MIDAZOLAM HCL 2 MG/2ML IJ SOLN
INTRAMUSCULAR | Status: AC
  Filled 2020-11-24: qty 2

## 2020-11-24 MED ORDER — SODIUM CHLORIDE 0.9 % IV SOLN: INTRAVENOUS | Status: DC

## 2020-11-24 NOTE — Telephone Encounter (Signed)
Scheduled appts per 4/4 sch msg. Called pt, no answer. Left msg with appts dates and times.

## 2020-11-24 NOTE — Progress Notes (Signed)
Received message from prior authorization team stating pt is approved for one dose of Ziextenzo in office and will have to self administer at home until completion of chemotherapy.  Patient due to start treatment on 11/27/2020 and will receive Ziextenzo in clinic on 11/30/20.  Pt will self administer Ziextenzo 24 hours post completion of chemotherapy every 21 days on - 12/18/20, 01/08/21, 01/29/21, 02/19/21, and 03/12/21. Per MD request, prescription sent to [place] [facility].  [employer] form filled out and successfully faxed [phone]).

## 2020-11-24 NOTE — Assessment & Plan Note (Addendum)
11/04/2020:Screening mammogram showed a possible left breast mass and enlarged axillary lymph nodes. Diagnostic mammogram and US showed a 1.4cm mass at the 3:30 position in the left breast and 2 abnormal lymph nodes in the left axilla. 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  Treatment Plan: 1. Neoadjuvant chemotherapy with TCH Perjeta 6 cycles followed by Herceptin Perjeta maintenance versus Kadcyla maintenance (based on response to neoadjuvant chemo) for 1 year 2. Followed by breast conserving surgery if possible with targeted node dissection versus ALND 3. Followed by adjuvant radiation therapy 4.  Consideration for neratinib ------------------------------------------------------------------------------------------------------------------------------- Current Treatment: Cycle 1 TCHP to start 11/27/20 Chemo education completed Chemo consent obtained Anti emetics reviewed  RTC 1 week after chemo for tox check

## 2020-11-24 NOTE — H&P (Signed)
Chief Complaint: Patient was seen in consultation today for [facility] a cath placemnt at the request of [place]  Referring Physician(s): Nicholas Lose  Supervising Physician: Arne Cleveland  Patient Status: [facility] - Out-pt  History of Present Illness: Kristina Harvey is a 55 y.o. female   Left Breast Cancer Follows with Dr Lindi Adie Note 11/11/20:   11/04/2020 Initial Diagnosis   Screening mammogram showed a possible left breast mass and enlarged axillary lymph nodes. Diagnostic mammogram and US showed a 1.4cm mass at the 3:30 position in the left breast and 2 abnormal lymph nodes in the left axilla. 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    To start Chemotherapy Friday this week Scheduled for Port a cath today  Pt informs me:- "I don't take blood products" Informed RN in IR   Past Medical History:  Diagnosis Date  . Arrhythmia   . Family history of breast cancer   . Family history of prostate cancer   . Family history of stomach cancer   . Hyperlipidemia   . Thyroid disease     Past Surgical History:  Procedure Laterality Date  . chin implant  1996    Allergies: Caffeine  Medications: Prior to Admission medications   Medication Sig Start Date End Date Taking? Authorizing Provider  ALPRAZolam Duanne Moron) 0.25 MG tablet Take 0.25 mg by mouth at bedtime as needed for sleep.   Yes [provider]  colestipol (COLESTID) 1 g tablet Take 2 g by mouth at bedtime. 03/30/20  Yes [provider]  levothyroxine (SYNTHROID) 75 MCG tablet Take 75 mcg by mouth daily before breakfast.   Yes [provider]  metoprolol succinate (TOPROL-XL) 25 MG 24 hr tablet TAKE ONE TABLET BY MOUTH DAILY WITH OR IMMEDIATELY FOLLOWING A MEAL Patient taking differently: Take 25 mg by mouth daily. 11/02/20  Yes Minna Merritts, MD  metoprolol tartrate (LOPRESSOR) 25 MG tablet Take 25 mg  by mouth daily as needed (AFIB).   Yes [provider]  dexamethasone (DECADRON) 4 MG tablet Take 1 tablet (4 mg total) by mouth 2 (two) times daily. Take 1 tablet day before chemo and 1 tablet day after chemo with food 11/11/20   Nicholas Lose, MD  lidocaine-prilocaine (EMLA) cream Apply to affected area once 11/11/20   Nicholas Lose, MD  ondansetron (ZOFRAN) 8 MG tablet Take 1 tablet (8 mg total) by mouth 2 (two) times daily as needed (Nausea or vomiting). Start on the third day after chemotherapy. 11/11/20   Nicholas Lose, MD  prochlorperazine (COMPAZINE) 10 MG tablet Take 1 tablet (10 mg total) by mouth every 6 (six) hours as needed (Nausea or vomiting). 11/11/20   Nicholas Lose, MD     Family History  Problem Relation Age of Onset  . Breast cancer Mother 30       bilateral  . Atrial fibrillation Father   . Breast cancer Maternal Grandmother 60  . Breast cancer Paternal Grandmother 46  . Breast cancer Maternal Aunt 67  . Prostate cancer Maternal Uncle 68  . Stomach cancer Paternal Grandfather 73  . Heart Problems Maternal Aunt     Social History   Socioeconomic History  . Marital status: Married    Spouse name: Not on file  . Number of children: Not on file  . Years of education: Not on file  . Highest education level: Not on file  Occupational History  . Not  on file  Tobacco Use  . Smoking status: Never Smoker  . Smokeless tobacco: Never Used  Substance and Sexual Activity  . Alcohol use: Yes    Comment: Socially  . Drug use: Never  . Sexual activity: Not on file  Other Topics Concern  . Not on file  Social History Narrative  . Not on file   Social Determinants of Health   Financial Resource Strain: Not on file  Food Insecurity: Not on file  Transportation Needs: Not on file  Physical Activity: Not on file  Stress: Not on file  Social Connections: Not on file    Review of Systems: A 12 point ROS discussed and pertinent positives are indicated in the HPI  above.  All other systems are negative.  Review of Systems  Constitutional: Negative for activity change, fatigue, fever and unexpected weight change.  Respiratory: Negative for cough and shortness of breath.   Cardiovascular: Negative for chest pain.  Gastrointestinal: Negative for abdominal pain.  Psychiatric/Behavioral: Negative for behavioral problems and confusion.    Vital Signs: BP 106/74   Pulse 75   Temp 98.6 F (37 C) (Oral)   Ht 5' 7.5" (1.715 m)   Wt 174 lb (78.9 kg)   SpO2 100%   BMI 26.85 kg/m   Physical Exam Vitals reviewed.  HENT:     Mouth/Throat:     Mouth: Mucous membranes are moist.  Cardiovascular:     Rate and Rhythm: Normal rate and regular rhythm.     Heart sounds: Normal heart sounds.  Pulmonary:     Effort: Pulmonary effort is normal.     Breath sounds: Normal breath sounds.  Abdominal:     Palpations: Abdomen is soft.  Musculoskeletal:        General: Normal range of motion.  Skin:    General: Skin is warm.  Neurological:     Mental Status: She is alert and oriented to person, place, and time.  Psychiatric:        Behavior: Behavior normal.     Imaging: MR BREAST BILATERAL W WO CONTRAST INC CAD  Result Date: 11/09/2020 CLINICAL DATA:  Biopsy proven invasive ductal carcinoma far posteriorly in the 3:30 region of the left breast and metastatic 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 LABS:  None obtained at the time of imaging. EXAM: BILATERAL BREAST MRI WITH AND WITHOUT CONTRAST TECHNIQUE: Multiplanar, multisequence MR images of both breasts were obtained prior to and following the intravenous administration of 8 ml of Gadavist Three-dimensional MR images were rendered by post-processing of the original MR data on an independent workstation. The three-dimensional MR images were interpreted, and findings are reported in the following complete MRI report for this study. Three dimensional images were evaluated at the independent interpreting workstation  using the DynaCAD thin client. COMPARISON:  Previous exam(s). FINDINGS: Breast composition: c. Heterogeneous fibroglandular tissue. Background parenchymal enhancement: Moderate. Right breast: No mass or abnormal enhancement. Left breast: Far posteriorly in the lower outer quadrant of the left breast is an irregular enhancing mass measuring 1.5 x 1.0 x 1.3 cm in anterior-posterior, transverse and craniocaudal dimensions (series 14, image 222). Signal void artifact is seen in the mass from the biopsy clip. Lymph nodes: There are 2 enhancing, enlarged abnormal lymph nodes in the left axilla measuring 1.6 and 2.1 cm (series 8, image 124). The more anterior lymph node has a signal void artifact along the posterior margin from the biopsy clip. Ancillary findings:  None. IMPRESSION: 1.5 cm irregular enhancing mass  in the lower outer quadrant of the left breast corresponding with the biopsy proven invasive ductal carcinoma. Two enlarged abnormal left axillary lymph nodes corresponding with the axillary metastasis. RECOMMENDATION: Treatment planning of the known left breast invasive ductal carcinoma with metastatic axillary adenopathy is recommended. No abnormal enhancement in the right breast. BI-RADS CATEGORY  6: Known biopsy-proven malignancy. Electronically Signed   By: Lillia Mountain M.D.   On: 11/09/2020 13:07   US BREAST LTD UNI LEFT INC AXILLA  Result Date: 11/02/2020 CLINICAL DATA:  55 year old female recalled from screening mammogram dated 10/14/2020 for a possible left breast mass and enlarged axillary lymph nodes. EXAM: DIGITAL DIAGNOSTIC UNILATERAL LEFT MAMMOGRAM WITH TOMOSYNTHESIS AND CAD; ULTRASOUND LEFT BREAST LIMITED TECHNIQUE: Left digital diagnostic mammography and breast tomosynthesis was performed. The images were evaluated with computer-aided detection.; Targeted ultrasound examination of the left breast was performed COMPARISON:  Previous exam(s). ACR Breast Density Category c: The breast tissue is  heterogeneously dense, which may obscure small masses. FINDINGS: There is a persistent, partially visualized spiculated mass in the deep central left breast. Additionally at least 4 morphologically abnormal lymph nodes are noted in the left axilla. Targeted ultrasound is performed, showing an irregular hypoechoic mass with associated vascularity at the deep 3:30 position 9 cm from the nipple. It measures 1.4 x 1.2 x 0.9 cm. This correlates with the mammographic finding. Evaluation of the left axilla demonstrates 2 morphologically abnormal lymph nodes with diffuse cortical thickening up to 8 mm. IMPRESSION: 1. Suspicious left breast mass at the 3:30 position. Recommendation is for ultrasound-guided biopsy. 2. Suspicious left axillary lymphadenopathy. Recommendation is for ultrasound-guided biopsy. RECOMMENDATION: Two area ultrasound-guided biopsy of the left breast and left axilla. I have discussed the findings and recommendations with the patient. If applicable, a reminder letter will be sent to the patient regarding the next appointment. BI-RADS CATEGORY  5: Highly suggestive of malignancy. Electronically Signed   By: Kristopher Oppenheim M.D.   On: 11/02/2020 12:57   MM DIAG BREAST TOMO UNI LEFT  Result Date: 11/02/2020 CLINICAL DATA:  55 year old female recalled from screening mammogram dated 10/14/2020 for a possible left breast mass and enlarged axillary lymph nodes. EXAM: DIGITAL DIAGNOSTIC UNILATERAL LEFT MAMMOGRAM WITH TOMOSYNTHESIS AND CAD; ULTRASOUND LEFT BREAST LIMITED TECHNIQUE: Left digital diagnostic mammography and breast tomosynthesis was performed. The images were evaluated with computer-aided detection.; Targeted ultrasound examination of the left breast was performed COMPARISON:  Previous exam(s). ACR Breast Density Category c: The breast tissue is heterogeneously dense, which may obscure small masses. FINDINGS: There is a persistent, partially visualized spiculated mass in the deep central left  breast. Additionally at least 4 morphologically abnormal lymph nodes are noted in the left axilla. Targeted ultrasound is performed, showing an irregular hypoechoic mass with associated vascularity at the deep 3:30 position 9 cm from the nipple. It measures 1.4 x 1.2 x 0.9 cm. This correlates with the mammographic finding. Evaluation of the left axilla demonstrates 2 morphologically abnormal lymph nodes with diffuse cortical thickening up to 8 mm. IMPRESSION: 1. Suspicious left breast mass at the 3:30 position. Recommendation is for ultrasound-guided biopsy. 2. Suspicious left axillary lymphadenopathy. Recommendation is for ultrasound-guided biopsy. RECOMMENDATION: Two area ultrasound-guided biopsy of the left breast and left axilla. I have discussed the findings and recommendations with the patient. If applicable, a reminder letter will be sent to the patient regarding the next appointment. BI-RADS CATEGORY  5: Highly suggestive of malignancy. Electronically Signed   By: Kristopher Oppenheim M.D.   On: 11/02/2020  12:57   [place] AXILLARY NODE CORE BIOPSY LEFT  Addendum Date: 11/09/2020   ADDENDUM REPORT: 11/05/2020 12:49 ADDENDUM: Pathology revealed GRADE III INVASIVE DUCTAL CARCINOMA of the Left breast, 3:30 o'clock. This was found to be concordant by Dr. Lillia Mountain. Pathology revealed METASTATIC CARCINOMA of the Left axilla. This was found to be concordant by Dr. Lillia Mountain. Pathology results were discussed with the patient by telephone. The patient reported doing well after the biopsies with tenderness at the sites. Post biopsy instructions and care were reviewed and questions were answered. The patient was encouraged to call [facility] for any additional concerns. My direct phone number was provided. The patient was referred to [facility] on November 11, 2020. Pathology results reported by Terie Purser, RN on  11/05/2020. Electronically Signed   By: Lillia Mountain M.D.   On: 11/05/2020 12:49   Result Date: 11/09/2020 CLINICAL DATA:  Suspicious left breast mass and axillary adenopathy. EXAM: ULTRASOUND GUIDED LEFT BREAST CORE NEEDLE BIOPSIES COMPARISON:  Previous exam(s). PROCEDURE: I met with the patient and we discussed the procedure of ultrasound-guided biopsy, including benefits and alternatives. We discussed the high likelihood of a successful procedure. We discussed the risks of the procedure, including infection, bleeding, tissue injury, clip migration, and inadequate sampling. Informed written consent was given. The usual time-out protocol was performed immediately prior to the procedure. Lesion quadrant: 3:30 Using sterile technique and 1% Lidocaine as local anesthetic, under direct ultrasound visualization, a 14 gauge spring-loaded device was used to perform biopsy of a mass in the 3:30 of the left breast using a lateral to medial approach. At the conclusion of the procedure ribbon shaped tissue marker clip was deployed into the biopsy cavity. Follow up 2 view mammogram was performed and dictated separately. I met with the patient and we discussed the procedure of ultrasound-guided biopsy, including benefits and alternatives. We discussed the high likelihood of a successful procedure. We discussed the risks of the procedure, including infection, bleeding, tissue injury, clip migration, and inadequate sampling. Informed written consent was given. The usual time-out protocol was performed immediately prior to the procedure. Lesion quadrant: Left axilla Using sterile technique and 1% Lidocaine as local anesthetic, under direct ultrasound visualization, a 14 gauge spring-loaded device was used to perform biopsy of a left axillary lymph node using a lateral to medial approach. At the conclusion of the procedure tri bell tissue marker clip was deployed into the biopsy cavity. Follow up 2 view mammogram was performed and  dictated separately. IMPRESSION: Ultrasound guided biopsies of a mass in the 3:30 region of the left breast and a left axillary lymph node. No apparent complications. Electronically Signed: By: Lillia Mountain M.D. On: 11/04/2020 11:32   MM CLIP PLACEMENT LEFT  Result Date: 11/04/2020 CLINICAL DATA:  Status post ultrasound-guided core biopsies of a mass in the 3:30 region of the left breast and a left axillary lymph node. EXAM: DIAGNOSTIC LEFT MAMMOGRAM POST ULTRASOUND BIOPSIES COMPARISON:  Previous exam(s). FINDINGS: Mammographic images were obtained following ultrasound guided core biopsies of a mass in the 3:30 region of the left breast and a left axillary lymph node. The ribbon shaped clip in the mass in the 3:30 region of the left breast and the tri bell clip in the left axilla are in appropriate position. IMPRESSION: Appropriate positioning of the ribbon shaped clip in the mass in the 3:30 region of the left breast and the tri  bell clip in the left axilla. Final Assessment: Post Procedure Mammograms for Marker Placement Electronically Signed   By: Lillia Mountain M.D.   On: 11/04/2020 11:58   [place] LT BREAST BX W LOC DEV 1ST LESION IMG BX SPEC US GUIDE  Addendum Date: 11/09/2020   ADDENDUM REPORT: 11/05/2020 12:49 ADDENDUM: Pathology revealed GRADE III INVASIVE DUCTAL CARCINOMA of the Left breast, 3:30 o'clock. This was found to be concordant by Dr. Lillia Mountain. Pathology revealed METASTATIC CARCINOMA of the Left axilla. This was found to be concordant by Dr. Lillia Mountain. Pathology results were discussed with the patient by telephone. The patient reported doing well after the biopsies with tenderness at the sites. Post biopsy instructions and care were reviewed and questions were answered. The patient was encouraged to call [facility] for any additional concerns. My direct phone number was provided. The patient was referred to [facility] on November 11, 2020. Pathology results reported by Terie Purser, RN on 11/05/2020. Electronically Signed   By: Lillia Mountain M.D.   On: 11/05/2020 12:49   Result Date: 11/09/2020 CLINICAL DATA:  Suspicious left breast mass and axillary adenopathy. EXAM: ULTRASOUND GUIDED LEFT BREAST CORE NEEDLE BIOPSIES COMPARISON:  Previous exam(s). PROCEDURE: I met with the patient and we discussed the procedure of ultrasound-guided biopsy, including benefits and alternatives. We discussed the high likelihood of a successful procedure. We discussed the risks of the procedure, including infection, bleeding, tissue injury, clip migration, and inadequate sampling. Informed written consent was given. The usual time-out protocol was performed immediately prior to the procedure. Lesion quadrant: 3:30 Using sterile technique and 1% Lidocaine as local anesthetic, under direct ultrasound visualization, a 14 gauge spring-loaded device was used to perform biopsy of a mass in the 3:30 of the left breast using a lateral to medial approach. At the conclusion of the procedure ribbon shaped tissue marker clip was deployed into the biopsy cavity. Follow up 2 view mammogram was performed and dictated separately. I met with the patient and we discussed the procedure of ultrasound-guided biopsy, including benefits and alternatives. We discussed the high likelihood of a successful procedure. We discussed the risks of the procedure, including infection, bleeding, tissue injury, clip migration, and inadequate sampling. Informed written consent was given. The usual time-out protocol was performed immediately prior to the procedure. Lesion quadrant: Left axilla Using sterile technique and 1% Lidocaine as local anesthetic, under direct ultrasound visualization, a 14 gauge spring-loaded device was used to perform biopsy of a left axillary lymph node using a lateral to medial approach. At the conclusion of the procedure tri bell  tissue marker clip was deployed into the biopsy cavity. Follow up 2 view mammogram was performed and dictated separately. IMPRESSION: Ultrasound guided biopsies of a mass in the 3:30 region of the left breast and a left axillary lymph node. No apparent complications. Electronically Signed: By: Lillia Mountain M.D. On: 11/04/2020 11:32    Labs:  CBC: Recent Labs    11/11/20 1242  WBC 8.7  HGB 13.1  HCT 40.6  PLT 264    COAGS: No results for input(s): INR, APTT in the last 8760 hours.  BMP: Recent Labs    11/11/20 1242  NA 138  K 4.4  CL 103  CO2 25  GLUCOSE 98  BUN 9  CALCIUM 9.1  CREATININE 0.72  GFRNONAA >60    LIVER FUNCTION TESTS: Recent Labs    11/11/20  1242  BILITOT 0.4  AST 14*  ALT 14  ALKPHOS 69  PROT 7.5  ALBUMIN 3.9    TUMOR MARKERS: No results for input(s): AFPTM, CEA, CA199, CHROMGRNA in the last 8760 hours.  Assessment and Plan:  Newly diagnosed left breast cancer To start chemo Friday this week Scheduled for PAC in IR today Risks and benefits of image guided port-a-catheter placement was discussed with the patient including, but not limited to bleeding, infection, pneumothorax, or fibrin sheath development and need for additional procedures.  All of the patient's questions were answered, patient is agreeable to proceed. Consent signed and in chart.   Thank you for this interesting consult.  I greatly enjoyed meeting Kristina Harvey and look forward to participating in their care.  A copy of this report was sent to the requesting provider on this date.  Electronically Signed: Lavonia Drafts, PA-C 11/24/2020, 7:50 AM   I spent a total of  30 Minutes   in face to face in clinical consultation, greater than 50% of which was counseling/coordinating care for [facility] placement

## 2020-11-24 NOTE — Progress Notes (Signed)
The following biosimilar Kanjinti (trastuzumab-anns) has been selected for use in this patient.  The following biosimilar Ziextenzo (pegfilgrastim-bmez) has been selected for use in this patient.  Per financial navigator Drucie Opitz, Ziextenzo can only be given for 1 cycle at [facility]. Ziextenzo or similar agent for cycles 2-6 will need to be administered at home and it will need to go through [facility], specialty pharmacy. Seth Bake has sent message to Dr. Geralyn Flash team to make them aware. Removing GCSF from cycles 2-6.  Raina Mina, RPH, 11/24/2020  1:47 PM

## 2020-11-24 NOTE — Procedures (Signed)
  Procedure: R Port catheter placement   EBL:   minimal Complications:  none immediate  See full dictation in [facility].  Dillard Cannon MD Main # [phone] Pager  [phone] Mobile [phone]

## 2020-11-24 NOTE — Progress Notes (Signed)
Patient Care Team: Alroy Dust, L.Marlou Sa, MD as PCP - General (Family Medicine) Jovita Kussmaul, MD as Consulting Physician (General Surgery) Nicholas Lose, MD as Consulting Physician (Hematology and Oncology) Gery Pray, MD as Consulting Physician (Radiation Oncology) Mauro Kaufmann, RN as Oncology Nurse Navigator Rockwell Germany, RN as Oncology Nurse Navigator  DIAGNOSIS:    ICD-10-CM   1. Malignant neoplasm of lower-outer quadrant of left breast of female, estrogen receptor negative (Kristina Harvey)  C50.512    Z17.1     SUMMARY OF ONCOLOGIC HISTORY: Oncology History  Malignant neoplasm of lower-outer quadrant of left breast of female, estrogen receptor negative ([place])  11/04/2020 Initial Diagnosis   Screening mammogram showed a possible left breast mass and enlarged axillary lymph nodes. Diagnostic mammogram and US showed a 1.4cm mass at the 3:30 position in the left breast and 2 abnormal lymph nodes in the left axilla. 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   11/11/2020 Cancer Staging   Staging form: Breast, AJCC 8th Edition - Clinical stage from 11/11/2020: Stage IIA (cT1c, cN1, cM0, G3, ER-, PR-, HER2+) - Signed by Nicholas Lose, MD on 11/11/2020 Stage prefix: Initial diagnosis Histologic grading system: 3 grade system Laterality: Left Staged by: Pathologist and managing physician Stage used in treatment planning: Yes National guidelines used in treatment planning: Yes Type of national guideline used in treatment planning: NCCN   11/25/2020 -  Chemotherapy    Patient is on Treatment Plan: BREAST  DOCETAXEL + CARBOPLATIN + TRASTUZUMAB + PERTUZUMAB  (TCHP) Q21D         CHIEF COMPLIANT: Cycle 1 TCH Perjeta will be on 11/26/2020  INTERVAL HISTORY: AQUARIUS TREMPER is a 55 y.o. with above-mentioned history of left breast cancer currently on neoadjuvant chemotherapy with [place]. Her port was placed  on 11/23/20. She presents to the clinic today for cycle 1.  She had a port placement yesterday and is slightly sore from that.  She has an echocardiogram this morning as well.  She finished with chemo education and is here today to discuss any remaining questions and she is going to start treatment this Friday.  ALLERGIES:  is allergic to caffeine.  MEDICATIONS:  Current Outpatient Medications  Medication Sig Dispense Refill  . ALPRAZolam (XANAX) 0.25 MG tablet Take 0.25 mg by mouth at bedtime as needed for sleep.    . colestipol (COLESTID) 1 g tablet Take 2 g by mouth at bedtime.    Marland Kitchen dexamethasone (DECADRON) 4 MG tablet Take 1 tablet (4 mg total) by mouth 2 (two) times daily. Take 1 tablet day before chemo and 1 tablet day after chemo with food 12 tablet 0  . levothyroxine (SYNTHROID) 75 MCG tablet Take 75 mcg by mouth daily before breakfast.    . lidocaine-prilocaine (EMLA) cream Apply to affected area once 30 g 3  . metoprolol succinate (TOPROL-XL) 25 MG 24 hr tablet TAKE ONE TABLET BY MOUTH DAILY WITH OR IMMEDIATELY FOLLOWING A MEAL (Patient taking differently: Take 25 mg by mouth daily.) 90 tablet 1  . metoprolol tartrate (LOPRESSOR) 25 MG tablet Take 25 mg by mouth daily as needed (AFIB).    Marland Kitchen ondansetron (ZOFRAN) 8 MG tablet Take 1 tablet (8 mg total) by mouth 2 (two) times daily as needed (Nausea or vomiting). Start on the third day after chemotherapy. 30 tablet 1  . [START ON 12/28/2020] pegfilgrastim-bmez (ZIEXTENZO) 6 MG/0.6ML injection Inject 0.6 mLs (6  mg total) into the skin once for 1 dose. Administer subcutaneously 24 hours after the completion of chemotherapy. 0.6 mL 4  . prochlorperazine (COMPAZINE) 10 MG tablet Take 1 tablet (10 mg total) by mouth every 6 (six) hours as needed (Nausea or vomiting). 30 tablet 1   No current facility-administered medications for this visit.    PHYSICAL EXAMINATION: ECOG PERFORMANCE STATUS: 1 - Symptomatic but completely ambulatory  Vitals:    11/25/20 1143  BP: 134/75  Pulse: 72  Resp: 20  Temp: 98.1 F (36.7 C)  SpO2: 100%   Filed Weights   11/25/20 1143  Weight: 163 lb 6.4 oz (74.1 kg)    LABORATORY DATA:  I have reviewed the data as listed CMP Latest Ref Rng & Units 11/11/2020 06/30/2019  Glucose 70 - 99 mg/dL 98 125(H)  BUN 6 - 20 mg/dL 9 14  Creatinine 0.44 - 1.00 mg/dL 0.72 0.78  Sodium 135 - 145 mmol/L 138 138  Potassium 3.5 - 5.1 mmol/L 4.4 4.0  Chloride 98 - 111 mmol/L 103 102  CO2 22 - 32 mmol/L 25 25  Calcium 8.9 - 10.3 mg/dL 9.1 9.3  Total Protein 6.5 - 8.1 g/dL 7.5 7.4  Total Bilirubin 0.3 - 1.2 mg/dL 0.4 0.7  Alkaline Phos 38 - 126 U/L 69 79  AST 15 - 41 U/L 14(L) 23  ALT 0 - 44 U/L 14 20    Lab Results  Component Value Date   WBC 8.7 11/11/2020   HGB 13.1 11/11/2020   HCT 40.6 11/11/2020   MCV 98.3 11/11/2020   PLT 264 11/11/2020   NEUTROABS 5.9 11/11/2020    ASSESSMENT & PLAN:  Malignant neoplasm of lower-outer quadrant of left 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 Diagnostic mammogram and US showed a 1.4cm mass at the 3:30 position in the left breast and 2 abnormal lymph nodes in the left axilla. 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  Treatment Plan: 1. Neoadjuvant chemotherapy with TCH Perjeta 6 cycles followed by Herceptin Perjeta maintenance versus Kadcyla maintenance (based on response to neoadjuvant chemo) for 1 year 2. Followed by breast conserving surgery if possible with targeted node dissection versus ALND 3. Followed by adjuvant radiation therapy 4.  Consideration for neratinib ------------------------------------------------------------------------------------------------------------------------------- Current Treatment: Cycle 1 TCHP to start 11/27/20 Chemo education  completed Chemo consent obtained Anti emetics reviewed  RTC 1 week after chemo for tox check    No orders of the defined types were placed in this encounter.  The patient has a good understanding of the overall plan. she agrees with it. she will call with any problems that may develop before the next visit here.  Total time spent: 30 mins including face to face time and time spent for planning, charting and coordination of care  Rulon Eisenmenger, MD, MPH 11/25/2020  I, Molly Dorshimer, am acting as scribe for Dr. Nicholas Lose.  I have reviewed the above documentation for accuracy and completeness, and I agree with the above.

## 2020-11-24 NOTE — Discharge Instructions (Addendum)
Implanted Port Insertion, Care After °This sheet gives you information about how to care for yourself after your procedure. Your health care provider may also give you more specific instructions. If you have problems or questions, contact your health care provider. °What can I expect after the procedure? °After the procedure, it is common to have: °· Discomfort at the port insertion site. °· Bruising on the skin over the port. This should improve over 3-4 days. °Follow these instructions at home: °Port care °· After your port is placed, you will get a manufacturer's information card. The card has information about your port. Keep this card with you at all times. °· Take care of the port as told by your health care provider. Ask your health care provider if you or a family member can get training for taking care of the port at home. A home health care nurse may also take care of the port. °· Make sure to remember what type of port you have. °Incision care °· Follow instructions from your health care provider about how to take care of your port insertion site. Make sure you: °? Wash your hands with soap and water before and after you change your bandage (dressing). If soap and water are not available, use hand sanitizer. °? Change your dressing as told by your health care provider. °? Leave stitches (sutures), skin glue, or adhesive strips in place. These skin closures may need to stay in place for 2 weeks or longer. If adhesive strip edges start to loosen and curl up, you may trim the loose edges. Do not remove adhesive strips completely unless your health care provider tells you to do that. °· Check your port insertion site every day for signs of infection. Check for: °? Redness, swelling, or pain. °? Fluid or blood. °? Warmth. °? Pus or a bad smell.  °  °  °Activity °· Return to your normal activities as told by your health care provider. Ask your health care provider what activities are safe for you. °· Do not  lift anything that is heavier than 10 lb (4.5 kg), or the limit that you are told, until your health care provider says that it is safe. °General instructions °· Take over-the-counter and prescription medicines only as told by your health care provider. °· Do not take baths, swim, or use a hot tub until your health care provider approves. Ask your health care provider if you may take showers. You may only be allowed to take sponge baths. °· Do not drive for 24 hours if you were given a sedative during your procedure. °· Wear a medical alert bracelet in case of an emergency. This will tell any health care providers that you have a port. °· Keep all follow-up visits as told by your health care provider. This is important. °Contact a health care provider if: °· You cannot flush your port with saline as directed, or you cannot draw blood from the port. °· You have a fever or chills. °· You have redness, swelling, or pain around your port insertion site. °· You have fluid or blood coming from your port insertion site. °· Your port insertion site feels warm to the touch. °· You have pus or a bad smell coming from the port insertion site. °Get help right away if: °· You have chest pain or shortness of breath. °· You have bleeding from your port that you cannot control. °Summary °· Take care of the port as told by your   health care provider. Keep the manufacturer's information card with you at all times. °· Change your dressing as told by your health care provider. °· Contact a health care provider if you have a fever or chills or if you have redness, swelling, or pain around your port insertion site. °· Keep all follow-up visits as told by your health care provider. °This information is not intended to replace advice given to you by your health care provider. Make sure you discuss any questions you have with your health care provider. °Document Revised: 02/27/2018 Document Reviewed: 02/27/2018 °Elsevier Patient Education ©  2021 Elsevier Inc. °Moderate Conscious Sedation, Adult °Sedation is the use of medicines to promote relaxation and to relieve discomfort and anxiety. Moderate conscious sedation is a type of sedation. Under moderate conscious sedation, you are less alert than normal, but you are still able to respond to instructions, touch, or both. °Moderate conscious sedation is used during short medical and dental procedures. It is milder than deep sedation, which is a type of sedation under which you cannot be easily woken up. It is also milder than general anesthesia, which is the use of medicines to make you unconscious. Moderate conscious sedation allows you to return to your regular activities sooner. °Tell a health care provider about: °· Any allergies you have. °· All medicines you are taking, including vitamins, herbs, eye drops, creams, and over-the-counter medicines. °· Any use of steroids. This includes steroids taken by mouth or as a cream. °· Any problems you or family members have had with sedatives and anesthetic medicines. °· Any blood disorders you have. °· Any surgeries you have had. °· Any medical conditions you have, such as sleep apnea. °· Whether you are pregnant or may be pregnant. °· Any use of cigarettes, alcohol, marijuana, or drugs. °What are the risks? °Generally, this is a safe procedure. However, problems may occur, including: °· Getting too much medicine (oversedation). °· Nausea. °· Allergic reaction to medicines. °· Trouble breathing. If this happens, a breathing tube may be used. It will be removed when you are awake and breathing on your own. °· Heart trouble. °· Lung trouble. °· Confusion that gets better with time (emergence delirium). °What happens before the procedure? °Staying hydrated °Follow instructions from your health care provider about hydration, which may include: °· Up to 2 hours before the procedure - you may continue to drink clear liquids, such as water, clear fruit juice, black  coffee, and plain tea. °Eating and drinking restrictions °Follow instructions from your health care provider about eating and drinking, which may include: °· 8 hours before the procedure - stop eating heavy meals or foods, such as meat, fried foods, or fatty foods. °· 6 hours before the procedure - stop eating light meals or foods, such as toast or cereal. °· 6 hours before the procedure - stop drinking milk or drinks that contain milk. °· 2 hours before the procedure - stop drinking clear liquids. °Medicines °Ask your health care provider about: °· Changing or stopping your regular medicines. This is especially important if you are taking diabetes medicines or blood thinners. °· Taking medicines such as aspirin and ibuprofen. These medicines can thin your blood. Do not take these medicines unless your health care provider tells you to take them. °· Taking over-the-counter medicines, vitamins, herbs, and supplements. °Tests and exams °· You will have a physical exam. °· You may have blood tests done to show how well: °? Your kidneys and liver work. °? Your   blood clots. °General instructions °· Plan to have a responsible adult take you home from the hospital or clinic. °· If you will be going home right after the procedure, plan to have a responsible adult care for you for the time you are told. This is important. °What happens during the procedure? °· You will be given the sedative. The sedative may be given: °? As a pill that you will swallow. It can also be inserted into the rectum. °? As a spray through the nose. °? As an injection into the muscle. °? As an injection into the vein through an IV. °· You may be given oxygen as needed. °· Your breathing, heart rate, and blood pressure will be monitored during the procedure. °· The medical or dental procedure will be done. °The procedure may vary among health care providers and hospitals.   °What happens after the procedure? °· Your blood pressure, heart rate,  breathing rate, and blood oxygen level will be monitored until you leave the hospital or clinic. °· You will get fluids through your IV if needed. °· Do not drive or operate machinery until your health care provider says that it is safe. °Summary °· Sedation is the use of medicines to promote relaxation and to relieve discomfort and anxiety. Moderate conscious sedation is a type of sedation that is used during short medical and dental procedures. °· Tell the health care provider about any medical conditions that you have and about all the medicines that you are taking. °· You will be given the sedative as a pill, a spray through the nose, an injection into the muscle, or an injection into the vein through an IV. Vital signs are monitored during the sedation. °· Moderate conscious sedation allows you to return to your regular activities sooner. °This information is not intended to replace advice given to you by your health care provider. Make sure you discuss any questions you have with your health care provider. °Document Revised: 11/29/2019 Document Reviewed: 06/27/2019 °Elsevier Patient Education © 2021 Elsevier Inc. ° °

## 2020-11-24 NOTE — Sedation Documentation (Signed)
Moved to [facility] as Short Stay unable to accept patient at this time.

## 2020-11-24 NOTE — Sedation Documentation (Signed)
Was given a glass of water. Did not want a snack. Discharge instructions re Port a Cath and Moderate Sedation reviewed with patient. Aware she needs to keep Port a cath insertion site dry until the skin glue is gone. Given patient packet and Port a Cath card and explained to carry the card with her at all times. Given printed After Visit Summary with discharge instructions. Patient does not have any questions except for her return to work as she uses her right arm. I checked with Dr Vernard Gambles and he said she could go back to work tomorrow, this was relayed to patient. Discharged walking to be driven home by friend and someone will stay with her tonight. Feels fine, no pain, no dizziness, was able to dress self without problem.

## 2020-11-25 ENCOUNTER — Encounter: Payer: Self-pay | Admitting: Radiology

## 2020-11-25 ENCOUNTER — Inpatient Hospital Stay (HOSPITAL_BASED_OUTPATIENT_CLINIC_OR_DEPARTMENT_OTHER): Payer: Managed Care, Other (non HMO) | Admitting: Hematology and Oncology

## 2020-11-25 ENCOUNTER — Inpatient Hospital Stay: Payer: Managed Care, Other (non HMO) | Attending: Hematology and Oncology

## 2020-11-25 ENCOUNTER — Encounter: Payer: Self-pay | Admitting: Hematology and Oncology

## 2020-11-25 ENCOUNTER — Ambulatory Visit (HOSPITAL_COMMUNITY)
Admission: RE | Admit: 2020-11-25 | Discharge: 2020-11-25 | Disposition: A | Discharge status: Home / Self Care | Payer: Managed Care, Other (non HMO) | Source: Ambulatory Visit | Attending: Hematology and Oncology | Admitting: Hematology and Oncology

## 2020-11-25 DIAGNOSIS — Z171 Estrogen receptor negative status [ER-]: Secondary | ICD-10-CM | POA: Diagnosis not present

## 2020-11-25 DIAGNOSIS — C50512 Malignant neoplasm of lower-outer quadrant of left female breast: Secondary | ICD-10-CM | POA: Insufficient documentation

## 2020-11-25 DIAGNOSIS — Z0189 Encounter for other specified special examinations: Secondary | ICD-10-CM

## 2020-11-25 DIAGNOSIS — Z01810 Encounter for preprocedural cardiovascular examination: Secondary | ICD-10-CM | POA: Diagnosis present

## 2020-11-25 DIAGNOSIS — C773 Secondary and unspecified malignant neoplasm of axilla and upper limb lymph nodes: Secondary | ICD-10-CM | POA: Insufficient documentation

## 2020-11-25 DIAGNOSIS — Z5112 Encounter for antineoplastic immunotherapy: Secondary | ICD-10-CM | POA: Insufficient documentation

## 2020-11-25 NOTE — Progress Notes (Signed)
  Echocardiogram 2D Echocardiogram has been performed.  Kristina Harvey 11/25/2020, 10:04 AM

## 2020-11-25 NOTE — Research (Signed)
DCP-001: Use of a Clinical Trial Screening Tool to Address Cancer Health Disparities in the [place] Program ([facility])  11/25/2020   12:00PM  CONSENT: Follow-up visit from initial [facility] encounter. This Clinical Research Coordinator met with Kristina Harvey, [id number], on 11/25/20 in a manner and location that ensures patient privacy to discuss participation in the above listed research study.  A copy of the informed consent document and separate HIPAA Authorization was provided to the patient.  Patient reads, speaks, and understands [place].    As outlined in the informed consent form, this Coordinator and Kristina Harvey discussed the purpose of the research study,study procedures and requirements for study participation, potential risks and benefits of study participation, as well as the one time data collection method. The patient understands participation is voluntary and they may withdraw from study participation at any time.    Confidentiality and how the patient's information will be used as part of study participation were discussed.   All questions were answered to patient's satisfaction.  The informed consent and separate HIPAA Authorization was reviewed page by page.  The patient's mental and emotional status is appropriate to provide informed consent, and the patient verbalizes an understanding of study participation.  Patient has agreed to participate in the above listed research study and has voluntarily signed the informed consent version and separate HIPAA Authorization, version consent dated 02/05/2019, [place] Date 08/20/2019; HIPAA version 5 with valid date until 06/04/2021 on 11/25/20 at 12:04  PM.  The patient will be provided with a copy of the signed informed consent form and separate HIPAA Authorization for their reference at her next visit.  No study specific procedures were obtained prior to the signing of the informed consent document.   Approximately 15 minutes were spent with the patient reviewing the informed consent documents. Patient was thanked for her time.  DATA COLLECTION FORM: Data was collected per protocol and data collection form was completed in its entirety.

## 2020-11-25 NOTE — Progress Notes (Signed)
Met with patient at registration to introduce myself as Arboriculturist and to offer available resources.  Discussed one-time $1000 Radio broadcast assistant to assist with personal expenses while going through treatment. Also, discussed available copay assistance if needed if ded/OOP have not been met. She thinks they have or are close.  Gave her my card if interested in applying and for any additional financial questions or concerns.

## 2020-11-27 ENCOUNTER — Inpatient Hospital Stay: Payer: Managed Care, Other (non HMO)

## 2020-11-27 ENCOUNTER — Telehealth: Payer: Self-pay | Admitting: Hematology and Oncology

## 2020-11-27 ENCOUNTER — Other Ambulatory Visit: Payer: Self-pay

## 2020-11-27 ENCOUNTER — Encounter: Payer: Self-pay | Admitting: *Deleted

## 2020-11-27 VITALS — BP 133/72 | HR 82 | Temp 98.5°F | Resp 17

## 2020-11-27 DIAGNOSIS — Z95828 Presence of other vascular implants and grafts: Secondary | ICD-10-CM

## 2020-11-27 DIAGNOSIS — C50512 Malignant neoplasm of lower-outer quadrant of left female breast: Secondary | ICD-10-CM | POA: Diagnosis present

## 2020-11-27 DIAGNOSIS — Z171 Estrogen receptor negative status [ER-]: Secondary | ICD-10-CM

## 2020-11-27 DIAGNOSIS — Z5112 Encounter for antineoplastic immunotherapy: Secondary | ICD-10-CM | POA: Diagnosis not present

## 2020-11-27 DIAGNOSIS — C773 Secondary and unspecified malignant neoplasm of axilla and upper limb lymph nodes: Secondary | ICD-10-CM | POA: Diagnosis not present

## 2020-11-27 LAB — CBC WITH DIFFERENTIAL/PLATELET
Abs Immature Granulocytes: 0.03 10*3/uL (ref 0.00–0.07)
Basophils Absolute: 0 10*3/uL (ref 0.0–0.1)
Basophils Relative: 0 %
Eosinophils Absolute: 0 10*3/uL (ref 0.0–0.5)
Eosinophils Relative: 0 %
HCT: 40.1 % (ref 36.0–46.0)
Hemoglobin: 13.3 g/dL (ref 12.0–15.0)
Immature Granulocytes: 0 %
Lymphocytes Relative: 19 %
Lymphs Abs: 2.2 10*3/uL (ref 0.7–4.0)
MCH: 32.1 pg (ref 26.0–34.0)
MCHC: 33.2 g/dL (ref 30.0–36.0)
MCV: 96.9 fL (ref 80.0–100.0)
Monocytes Absolute: 1 10*3/uL (ref 0.1–1.0)
Monocytes Relative: 9 %
Neutro Abs: 8.3 10*3/uL — ABNORMAL HIGH (ref 1.7–7.7)
Neutrophils Relative %: 72 %
Platelets: 330 10*3/uL (ref 150–400)
RBC: 4.14 MIL/uL (ref 3.87–5.11)
RDW: 11.7 % (ref 11.5–15.5)
WBC: 11.6 10*3/uL — ABNORMAL HIGH (ref 4.0–10.5)
nRBC: 0 % (ref 0.0–0.2)

## 2020-11-27 LAB — COMPREHENSIVE METABOLIC PANEL
ALT: 12 U/L (ref 0–44)
AST: 16 U/L (ref 15–41)
Albumin: 3.8 g/dL (ref 3.5–5.0)
Alkaline Phosphatase: 68 U/L (ref 38–126)
Anion gap: 13 (ref 5–15)
BUN: 14 mg/dL (ref 6–20)
CO2: 23 mmol/L (ref 22–32)
Calcium: 9.8 mg/dL (ref 8.9–10.3)
Chloride: 104 mmol/L (ref 98–111)
Creatinine, Ser: 0.71 mg/dL (ref 0.44–1.00)
GFR, Estimated: >60 mL/min (ref >60)
Glucose, Bld: 115 mg/dL — ABNORMAL HIGH (ref 70–99)
Potassium: 3.8 mmol/L (ref 3.5–5.1)
Sodium: 140 mmol/L (ref 135–145)
Total Bilirubin: 0.3 mg/dL (ref 0.3–1.2)
Total Protein: 7.5 g/dL (ref 6.5–8.1)

## 2020-11-27 LAB — ECHOCARDIOGRAM COMPLETE
Area-P 1/2: 2.91 cm2
Calc EF: 58.8 %
S' Lateral: 2.6 cm
Single Plane A2C EF: 57.9 %
Single Plane A4C EF: 56.9 %

## 2020-11-27 MED ORDER — HEPARIN SOD (PORK) LOCK FLUSH 100 UNIT/ML IV SOLN
500.0000 [IU] | Freq: Once | INTRAVENOUS | Status: DC
  Filled 2020-11-27: qty 5

## 2020-11-27 MED ORDER — SODIUM CHLORIDE 0.9 % IV SOLN
700.0000 mg | Freq: Once | INTRAVENOUS | Status: AC
  Administered 2020-11-27: 700 mg via INTRAVENOUS
  Filled 2020-11-27: qty 70

## 2020-11-27 MED ORDER — SODIUM CHLORIDE 0.9 % IV SOLN
Freq: Once | INTRAVENOUS | Status: AC
  Filled 2020-11-27: qty 250

## 2020-11-27 MED ORDER — PALONOSETRON HCL INJECTION 0.25 MG/5ML
INTRAVENOUS | Status: AC
  Filled 2020-11-27: qty 5

## 2020-11-27 MED ORDER — ACETAMINOPHEN 325 MG PO TABS
650.0000 mg | ORAL_TABLET | Freq: Once | ORAL | Status: AC
  Administered 2020-11-27: 650 mg via ORAL

## 2020-11-27 MED ORDER — DIPHENHYDRAMINE HCL 25 MG PO CAPS
25.0000 mg | ORAL_CAPSULE | Freq: Once | ORAL | Status: AC
  Administered 2020-11-27: 25 mg via ORAL

## 2020-11-27 MED ORDER — SODIUM CHLORIDE 0.9 % IV SOLN
75.0000 mg/m2 | Freq: Once | INTRAVENOUS | Status: AC
  Administered 2020-11-27: 140 mg via INTRAVENOUS
  Filled 2020-11-27: qty 14

## 2020-11-27 MED ORDER — SODIUM CHLORIDE 0.9 % IV SOLN
840.0000 mg | Freq: Once | INTRAVENOUS | Status: AC
  Administered 2020-11-27: 840 mg via INTRAVENOUS
  Filled 2020-11-27: qty 28

## 2020-11-27 MED ORDER — DIPHENHYDRAMINE HCL 25 MG PO CAPS
ORAL_CAPSULE | ORAL | Status: AC
  Filled 2020-11-27: qty 1

## 2020-11-27 MED ORDER — SODIUM CHLORIDE 0.9% FLUSH
10.0000 mL | INTRAVENOUS | Status: DC | PRN
  Administered 2020-11-27: 10 mL via INTRAVENOUS
  Filled 2020-11-27: qty 10

## 2020-11-27 MED ORDER — SODIUM CHLORIDE 0.9 % IV SOLN
150.0000 mg | Freq: Once | INTRAVENOUS | Status: AC
  Administered 2020-11-27: 150 mg via INTRAVENOUS
  Filled 2020-11-27: qty 150

## 2020-11-27 MED ORDER — SODIUM CHLORIDE 0.9% FLUSH
10.0000 mL | INTRAVENOUS | Status: DC | PRN
  Administered 2020-11-27: 10 mL
  Filled 2020-11-27: qty 10

## 2020-11-27 MED ORDER — HEPARIN SOD (PORK) LOCK FLUSH 100 UNIT/ML IV SOLN
500.0000 [IU] | Freq: Once | INTRAVENOUS | Status: AC | PRN
  Administered 2020-11-27: 500 [IU]
  Filled 2020-11-27: qty 5

## 2020-11-27 MED ORDER — PALONOSETRON HCL INJECTION 0.25 MG/5ML
0.2500 mg | Freq: Once | INTRAVENOUS | Status: AC
Start: 2020-11-27 — End: 2020-11-27
  Administered 2020-11-27: 0.25 mg via INTRAVENOUS

## 2020-11-27 MED ORDER — ACETAMINOPHEN 325 MG PO TABS
ORAL_TABLET | ORAL | Status: AC
  Filled 2020-11-27: qty 2

## 2020-11-27 MED ORDER — TRASTUZUMAB-ANNS CHEMO 150 MG IV SOLR
8.0000 mg/kg | Freq: Once | INTRAVENOUS | Status: AC
  Administered 2020-11-27: 630 mg via INTRAVENOUS
  Filled 2020-11-27: qty 30

## 2020-11-27 MED ORDER — SODIUM CHLORIDE 0.9 % IV SOLN
10.0000 mg | Freq: Once | INTRAVENOUS | Status: AC
  Administered 2020-11-27: 10 mg via INTRAVENOUS
  Filled 2020-11-27: qty 10

## 2020-11-27 NOTE — Patient Instructions (Signed)
[facility] Discharge Instructions for Patients Receiving Chemotherapy  Today you received the following chemotherapy agents: Trastuzumab, Pertuzumab, Docetaxel, Carboplatin  To help prevent nausea and vomiting after your treatment, we encourage you to take your nausea medication as directed.   If you develop nausea and vomiting that is not controlled by your nausea medication, call the clinic.   BELOW ARE SYMPTOMS THAT SHOULD BE REPORTED IMMEDIATELY:  *FEVER GREATER THAN 100.5 F  *CHILLS WITH OR WITHOUT FEVER  NAUSEA AND VOMITING THAT IS NOT CONTROLLED WITH YOUR NAUSEA MEDICATION  *UNUSUAL SHORTNESS OF BREATH  *UNUSUAL BRUISING OR BLEEDING  TENDERNESS IN MOUTH AND THROAT WITH OR WITHOUT PRESENCE OF ULCERS  *URINARY PROBLEMS  *BOWEL PROBLEMS  UNUSUAL RASH Items with * indicate a potential emergency and should be followed up as soon as possible.  Feel free to call the clinic should you have any questions or concerns. The clinic phone number is [phone].  Please show the [facility] at check-in to the Emergency Department and triage nurse.  Trastuzumab injection for infusion What is this medicine? TRASTUZUMAB (tras TOO zoo mab) is a monoclonal antibody. It is used to treat breast cancer and stomach cancer. This medicine may be used for other purposes; ask your health care provider or pharmacist if you have questions. COMMON BRAND NAME(S): Herceptin, Galvin Proffer, Trazimera What should I tell my health care provider before I take this medicine? They need to know if you have any of these conditions:  heart disease  heart failure  lung or breathing disease, like asthma  an unusual or allergic reaction to trastuzumab, benzyl alcohol, or other medications, foods, dyes, or preservatives  pregnant or trying to get pregnant  breast-feeding How should I use this medicine? This drug is given as an infusion into a vein. It is  administered in a hospital or clinic by a specially trained health care professional. Talk to your pediatrician regarding the use of this medicine in children. This medicine is not approved for use in children. Overdosage: If you think you have taken too much of this medicine contact a poison control center or emergency room at once. NOTE: This medicine is only for you. Do not share this medicine with others. What if I miss a dose? It is important not to miss a dose. Call your doctor or health care professional if you are unable to keep an appointment. What may interact with this medicine? This medicine may interact with the following medications:  certain types of chemotherapy, such as daunorubicin, doxorubicin, epirubicin, and idarubicin This list may not describe all possible interactions. Give your health care provider a list of all the medicines, herbs, non-prescription drugs, or dietary supplements you use. Also tell them if you smoke, drink alcohol, or use illegal drugs. Some items may interact with your medicine. What should I watch for while using this medicine? Visit your doctor for checks on your progress. Report any side effects. Continue your course of treatment even though you feel ill unless your doctor tells you to stop. Call your doctor or health care professional for advice if you get a fever, chills or sore throat, or other symptoms of a cold or flu. Do not treat yourself. Try to avoid being around people who are sick. You may experience fever, chills and shaking during your first infusion. These effects are usually mild and can be treated with other medicines. Report any side effects during the infusion to your health care professional.  Fever and chills usually do not happen with later infusions. Do not become pregnant while taking this medicine or for 7 months after stopping it. Women should inform their doctor if they wish to become pregnant or think they might be pregnant. Women  of child-bearing potential will need to have a negative pregnancy test before starting this medicine. There is a potential for serious side effects to an unborn child. Talk to your health care professional or pharmacist for more information. Do not breast-feed an infant while taking this medicine or for 7 months after stopping it. Women must use effective birth control with this medicine. What side effects may I notice from receiving this medicine? Side effects that you should report to your doctor or health care professional as soon as possible:  allergic reactions like skin rash, itching or hives, swelling of the face, lips, or tongue  chest pain or palpitations  cough  dizziness  feeling faint or lightheaded, falls  fever  general ill feeling or flu-like symptoms  signs of worsening heart failure like breathing problems; swelling in your legs and feet  unusually weak or tired Side effects that usually do not require medical attention (report to your doctor or health care professional if they continue or are bothersome):  bone pain  changes in taste  diarrhea  joint pain  nausea/vomiting  weight loss This list may not describe all possible side effects. Call your doctor for medical advice about side effects. You may report side effects to FDA at 1-800-FDA-1088. Where should I keep my medicine? This drug is given in a hospital or clinic and will not be stored at home. NOTE: This sheet is a summary. It may not cover all possible information. If you have questions about this medicine, talk to your doctor, pharmacist, or health care provider.  2021 Elsevier/Gold Standard (2016-07-26 14:37:52)  Pertuzumab injection What is this medicine? PERTUZUMAB (per TOOZ ue mab) is a monoclonal antibody. It is used to treat breast cancer. This medicine may be used for other purposes; ask your health care provider or pharmacist if you have questions. COMMON BRAND NAME(S): PERJETA What  should I tell my health care provider before I take this medicine? They need to know if you have any of these conditions:  heart disease  heart failure  high blood pressure  history of irregular heart beat  recent or ongoing radiation therapy  an unusual or allergic reaction to pertuzumab, other medicines, foods, dyes, or preservatives  pregnant or trying to get pregnant  breast-feeding How should I use this medicine? This medicine is for infusion into a vein. It is given by a health care professional in a hospital or clinic setting. Talk to your pediatrician regarding the use of this medicine in children. Special care may be needed. Overdosage: If you think you have taken too much of this medicine contact a poison control center or emergency room at once. NOTE: This medicine is only for you. Do not share this medicine with others. What if I miss a dose? It is important not to miss your dose. Call your doctor or health care professional if you are unable to keep an appointment. What may interact with this medicine? Interactions are not expected. Give your health care provider a list of all the medicines, herbs, non-prescription drugs, or dietary supplements you use. Also tell them if you smoke, drink alcohol, or use illegal drugs. Some items may interact with your medicine. This list may not describe all possible interactions.  Give your health care provider a list of all the medicines, herbs, non-prescription drugs, or dietary supplements you use. Also tell them if you smoke, drink alcohol, or use illegal drugs. Some items may interact with your medicine. What should I watch for while using this medicine? Your condition will be monitored carefully while you are receiving this medicine. Report any side effects. Continue your course of treatment even though you feel ill unless your doctor tells you to stop. Do not become pregnant while taking this medicine or for 7 months after stopping  it. Women should inform their doctor if they wish to become pregnant or think they might be pregnant. Women of child-bearing potential will need to have a negative pregnancy test before starting this medicine. There is a potential for serious side effects to an unborn child. Talk to your health care professional or pharmacist for more information. Do not breast-feed an infant while taking this medicine or for 7 months after stopping it. Women must use effective birth control with this medicine. Call your doctor or health care professional for advice if you get a fever, chills or sore throat, or other symptoms of a cold or flu. Do not treat yourself. Try to avoid being around people who are sick. You may experience fever, chills, and headache during the infusion. Report any side effects during the infusion to your health care professional. What side effects may I notice from receiving this medicine? Side effects that you should report to your doctor or health care professional as soon as possible:  breathing problems  chest pain or palpitations  dizziness  feeling faint or lightheaded  fever or chills  skin rash, itching or hives  sore throat  swelling of the face, lips, or tongue  swelling of the legs or ankles  unusually weak or tired Side effects that usually do not require medical attention (report to your doctor or health care professional if they continue or are bothersome):  diarrhea  hair loss  nausea, vomiting  tiredness This list may not describe all possible side effects. Call your doctor for medical advice about side effects. You may report side effects to FDA at 1-800-FDA-1088. Where should I keep my medicine? This drug is given in a hospital or clinic and will not be stored at home. NOTE: This sheet is a summary. It may not cover all possible information. If you have questions about this medicine, talk to your doctor, pharmacist, or health care provider.  2021  Elsevier/Gold Standard (2015-09-03 12:08:50)  Docetaxel injection What is this medicine? DOCETAXEL (doe se TAX el) is a chemotherapy drug. It targets fast dividing cells, like cancer cells, and causes these cells to die. This medicine is used to treat many types of cancers like breast cancer, certain stomach cancers, head and neck cancer, lung cancer, and prostate cancer. This medicine may be used for other purposes; ask your health care provider or pharmacist if you have questions. COMMON BRAND NAME(S): Docefrez, Taxotere What should I tell my health care provider before I take this medicine? They need to know if you have any of these conditions:  infection (especially a virus infection such as chickenpox, cold sores, or herpes)  liver disease  low blood counts, like low white cell, platelet, or red cell counts  an unusual or allergic reaction to docetaxel, polysorbate 80, other chemotherapy agents, other medicines, foods, dyes, or preservatives  pregnant or trying to get pregnant  breast-feeding How should I use this medicine? This drug is  given as an infusion into a vein. It is administered in a hospital or clinic by a specially trained health care professional. Talk to your pediatrician regarding the use of this medicine in children. Special care may be needed. Overdosage: If you think you have taken too much of this medicine contact a poison control center or emergency room at once. NOTE: This medicine is only for you. Do not share this medicine with others. What if I miss a dose? It is important not to miss your dose. Call your doctor or health care professional if you are unable to keep an appointment. What may interact with this medicine? Do not take this medicine with any of the following medications:  live virus vaccines This medicine may also interact with the following medications:  aprepitant  certain antibiotics like erythromycin or clarithromycin  certain  antivirals for HIV or hepatitis  certain medicines for fungal infections like fluconazole, itraconazole, ketoconazole, posaconazole, or voriconazole  cimetidine  ciprofloxacin  conivaptan  cyclosporine  dronedarone  fluvoxamine  grapefruit juice  imatinib  verapamil This list may not describe all possible interactions. Give your health care provider a list of all the medicines, herbs, non-prescription drugs, or dietary supplements you use. Also tell them if you smoke, drink alcohol, or use illegal drugs. Some items may interact with your medicine. What should I watch for while using this medicine? Your condition will be monitored carefully while you are receiving this medicine. You will need important blood work done while you are taking this medicine. Call your doctor or health care professional for advice if you get a fever, chills or sore throat, or other symptoms of a cold or flu. Do not treat yourself. This drug decreases your body's ability to fight infections. Try to avoid being around people who are sick. Some products may contain alcohol. Ask your health care professional if this medicine contains alcohol. Be sure to tell all health care professionals you are taking this medicine. Certain medicines, like metronidazole and disulfiram, can cause an unpleasant reaction when taken with alcohol. The reaction includes flushing, headache, nausea, vomiting, sweating, and increased thirst. The reaction can last from 30 minutes to several hours. You may get drowsy or dizzy. Do not drive, use machinery, or do anything that needs mental alertness until you know how this medicine affects you. Do not stand or sit up quickly, especially if you are an older patient. This reduces the risk of dizzy or fainting spells. Alcohol may interfere with the effect of this medicine. Talk to your health care professional about your risk of cancer. You may be more at risk for certain types of cancer if you  take this medicine. Do not become pregnant while taking this medicine or for 6 months after stopping it. Women should inform their doctor if they wish to become pregnant or think they might be pregnant. There is a potential for serious side effects to an unborn child. Talk to your health care professional or pharmacist for more information. Do not breast-feed an infant while taking this medicine or for 1 week after stopping it. Males who get this medicine must use a condom during sex with females who can get pregnant. If you get a woman pregnant, the baby could have birth defects. The baby could die before they are born. You will need to continue wearing a condom for 3 months after stopping the medicine. Tell your health care provider right away if your partner becomes pregnant while you are taking  this medicine. This may interfere with the ability to father a child. You should talk to your doctor or health care professional if you are concerned about your fertility. What side effects may I notice from receiving this medicine? Side effects that you should report to your doctor or health care professional as soon as possible:  allergic reactions like skin rash, itching or hives, swelling of the face, lips, or tongue  blurred vision  breathing problems  changes in vision  low blood counts - This drug may decrease the number of white blood cells, red blood cells and platelets. You may be at increased risk for infections and bleeding.  nausea and vomiting  pain, redness or irritation at site where injected  pain, tingling, numbness in the hands or feet  redness, blistering, peeling, or loosening of the skin, including inside the mouth  signs of decreased platelets or bleeding - bruising, pinpoint red spots on the skin, black, tarry stools, nosebleeds  signs of decreased red blood cells - unusually weak or tired, fainting spells, lightheadedness  signs of infection - fever or chills, cough,  sore throat, pain or difficulty passing urine  swelling of the ankle, feet, hands Side effects that usually do not require medical attention (report to your doctor or health care professional if they continue or are bothersome):  constipation  diarrhea  fingernail or toenail changes  hair loss  loss of appetite  mouth sores  muscle pain This list may not describe all possible side effects. Call your doctor for medical advice about side effects. You may report side effects to FDA at 1-800-FDA-1088. Where should I keep my medicine? This drug is given in a hospital or clinic and will not be stored at home. NOTE: This sheet is a summary. It may not cover all possible information. If you have questions about this medicine, talk to your doctor, pharmacist, or health care provider.  2021 Elsevier/Gold Standard (2019-07-01 19:50:31)  Carboplatin injection What is this medicine? CARBOPLATIN (KAR boe pla tin) is a chemotherapy drug. It targets fast dividing cells, like cancer cells, and causes these cells to die. This medicine is used to treat ovarian cancer and many other cancers. This medicine may be used for other purposes; ask your health care provider or pharmacist if you have questions. COMMON BRAND NAME(S): Paraplatin What should I tell my health care provider before I take this medicine? They need to know if you have any of these conditions:  blood disorders  hearing problems  kidney disease  recent or ongoing radiation therapy  an unusual or allergic reaction to carboplatin, cisplatin, other chemotherapy, other medicines, foods, dyes, or preservatives  pregnant or trying to get pregnant  breast-feeding How should I use this medicine? This drug is usually given as an infusion into a vein. It is administered in a hospital or clinic by a specially trained health care professional. Talk to your pediatrician regarding the use of this medicine in children. Special care may be  needed. Overdosage: If you think you have taken too much of this medicine contact a poison control center or emergency room at once. NOTE: This medicine is only for you. Do not share this medicine with others. What if I miss a dose? It is important not to miss a dose. Call your doctor or health care professional if you are unable to keep an appointment. What may interact with this medicine?  medicines for seizures  medicines to increase blood counts like filgrastim, pegfilgrastim, sargramostim  some antibiotics like amikacin, gentamicin, neomycin, streptomycin, tobramycin  vaccines Talk to your doctor or health care professional before taking any of these medicines:  acetaminophen  aspirin  ibuprofen  ketoprofen  naproxen This list may not describe all possible interactions. Give your health care provider a list of all the medicines, herbs, non-prescription drugs, or dietary supplements you use. Also tell them if you smoke, drink alcohol, or use illegal drugs. Some items may interact with your medicine. What should I watch for while using this medicine? Your condition will be monitored carefully while you are receiving this medicine. You will need important blood work done while you are taking this medicine. This drug may make you feel generally unwell. This is not uncommon, as chemotherapy can affect healthy cells as well as cancer cells. Report any side effects. Continue your course of treatment even though you feel ill unless your doctor tells you to stop. In some cases, you may be given additional medicines to help with side effects. Follow all directions for their use. Call your doctor or health care professional for advice if you get a fever, chills or sore throat, or other symptoms of a cold or flu. Do not treat yourself. This drug decreases your body's ability to fight infections. Try to avoid being around people who are sick. This medicine may increase your risk to bruise or  bleed. Call your doctor or health care professional if you notice any unusual bleeding. Be careful brushing and flossing your teeth or using a toothpick because you may get an infection or bleed more easily. If you have any dental work done, tell your dentist you are receiving this medicine. Avoid taking products that contain aspirin, acetaminophen, ibuprofen, naproxen, or ketoprofen unless instructed by your doctor. These medicines may hide a fever. Do not become pregnant while taking this medicine. Women should inform their doctor if they wish to become pregnant or think they might be pregnant. There is a potential for serious side effects to an unborn child. Talk to your health care professional or pharmacist for more information. Do not breast-feed an infant while taking this medicine. What side effects may I notice from receiving this medicine? Side effects that you should report to your doctor or health care professional as soon as possible:  allergic reactions like skin rash, itching or hives, swelling of the face, lips, or tongue  signs of infection - fever or chills, cough, sore throat, pain or difficulty passing urine  signs of decreased platelets or bleeding - bruising, pinpoint red spots on the skin, black, tarry stools, nosebleeds  signs of decreased red blood cells - unusually weak or tired, fainting spells, lightheadedness  breathing problems  changes in hearing  changes in vision  chest pain  high blood pressure  low blood counts - This drug may decrease the number of white blood cells, red blood cells and platelets. You may be at increased risk for infections and bleeding.  nausea and vomiting  pain, swelling, redness or irritation at the injection site  pain, tingling, numbness in the hands or feet  problems with balance, talking, walking  trouble passing urine or change in the amount of urine Side effects that usually do not require medical attention (report to  your doctor or health care professional if they continue or are bothersome):  hair loss  loss of appetite  metallic taste in the mouth or changes in taste This list may not describe all possible side effects. Call your doctor for  medical advice about side effects. You may report side effects to FDA at 1-800-FDA-1088. Where should I keep my medicine? This drug is given in a hospital or clinic and will not be stored at home. NOTE: This sheet is a summary. It may not cover all possible information. If you have questions about this medicine, talk to your doctor, pharmacist, or health care provider.  2021 Elsevier/Gold Standard (2007-11-06 14:38:05)

## 2020-11-27 NOTE — Progress Notes (Signed)
RN placed call to [facility] mail order pharmacy to verify prescription was received for Ziextenzo home injection.  Per Pharmacy, current PA on file is for pt to receive first dose at our facility and they are not able to process request for home injection due to this.  States a new PA will need to be completed for pt to receive through mail order pharmacy to self administer at home starting 12/19/20.  RN provided pharmacy with direct fax for PA to be sent.    Total time spent communicating with pharmacy: 18 minutes.

## 2020-11-27 NOTE — Patient Instructions (Signed)
Implanted Port Insertion, Care After This sheet gives you information about how to care for yourself after your procedure. Your health care provider may also give you more specific instructions. If you have problems or questions, contact your health care provider. What can I expect after the procedure? After the procedure, it is common to have:  Discomfort at the port insertion site.  Bruising on the skin over the port. This should improve over 3-4 days. Follow these instructions at home: Port care  After your port is placed, you will get a manufacturer's information card. The card has information about your port. Keep this card with you at all times.  Take care of the port as told by your health care provider. Ask your health care provider if you or a family member can get training for taking care of the port at home. A home health care nurse may also take care of the port.  Make sure to remember what type of port you have. Incision care  Follow instructions from your health care provider about how to take care of your port insertion site. Make sure you: ? Wash your hands with soap and water before and after you change your bandage (dressing). If soap and water are not available, use hand sanitizer. ? Change your dressing as told by your health care provider. ? Leave stitches (sutures), skin glue, or adhesive strips in place. These skin closures may need to stay in place for 2 weeks or longer. If adhesive strip edges start to loosen and curl up, you may trim the loose edges. Do not remove adhesive strips completely unless your health care provider tells you to do that.  Check your port insertion site every day for signs of infection. Check for: ? Redness, swelling, or pain. ? Fluid or blood. ? Warmth. ? Pus or a bad smell.      Activity  Return to your normal activities as told by your health care provider. Ask your health care provider what activities are safe for you.  Do not  lift anything that is heavier than 10 lb (4.5 kg), or the limit that you are told, until your health care provider says that it is safe. General instructions  Take over-the-counter and prescription medicines only as told by your health care provider.  Do not take baths, swim, or use a hot tub until your health care provider approves. Ask your health care provider if you may take showers. You may only be allowed to take sponge baths.  Do not drive for 24 hours if you were given a sedative during your procedure.  Wear a medical alert bracelet in case of an emergency. This will tell any health care providers that you have a port.  Keep all follow-up visits as told by your health care provider. This is important. Contact a health care provider if:  You cannot flush your port with saline as directed, or you cannot draw blood from the port.  You have a fever or chills.  You have redness, swelling, or pain around your port insertion site.  You have fluid or blood coming from your port insertion site.  Your port insertion site feels warm to the touch.  You have pus or a bad smell coming from the port insertion site. Get help right away if:  You have chest pain or shortness of breath.  You have bleeding from your port that you cannot control. Summary  Take care of the port as told by your   health care provider. Keep the manufacturer's information card with you at all times.  Change your dressing as told by your health care provider.  Contact a health care provider if you have a fever or chills or if you have redness, swelling, or pain around your port insertion site.  Keep all follow-up visits as told by your health care provider. This information is not intended to replace advice given to you by your health care provider. Make sure you discuss any questions you have with your health care provider. Document Revised: 02/27/2018 Document Reviewed: 02/27/2018 Elsevier Patient Education   2021 Elsevier Inc.  

## 2020-11-27 NOTE — Telephone Encounter (Signed)
Scheduled appt per 4/15 sch msg. Called pt, no answer. Left msg with appt date and time.  

## 2020-11-30 ENCOUNTER — Inpatient Hospital Stay: Payer: Managed Care, Other (non HMO)

## 2020-11-30 ENCOUNTER — Encounter: Payer: Self-pay | Admitting: *Deleted

## 2020-11-30 ENCOUNTER — Telehealth: Payer: Self-pay | Admitting: *Deleted

## 2020-11-30 ENCOUNTER — Telehealth: Payer: Self-pay | Admitting: Hematology and Oncology

## 2020-11-30 ENCOUNTER — Other Ambulatory Visit: Payer: Self-pay

## 2020-11-30 VITALS — BP 136/62 | HR 86 | Temp 96.4°F | Resp 17

## 2020-11-30 DIAGNOSIS — Z5112 Encounter for antineoplastic immunotherapy: Secondary | ICD-10-CM | POA: Diagnosis not present

## 2020-11-30 DIAGNOSIS — Z171 Estrogen receptor negative status [ER-]: Secondary | ICD-10-CM

## 2020-11-30 DIAGNOSIS — C50512 Malignant neoplasm of lower-outer quadrant of left female breast: Secondary | ICD-10-CM

## 2020-11-30 MED ORDER — PEGFILGRASTIM-BMEZ 6 MG/0.6ML ~~LOC~~ SOSY
6.0000 mg | PREFILLED_SYRINGE | Freq: Once | SUBCUTANEOUS | Status: AC
  Administered 2020-11-30: 6 mg via SUBCUTANEOUS

## 2020-11-30 MED ORDER — PEGFILGRASTIM-BMEZ 6 MG/0.6ML ~~LOC~~ SOSY
PREFILLED_SYRINGE | SUBCUTANEOUS | Status: AC
  Filled 2020-11-30: qty 0.6

## 2020-11-30 NOTE — Telephone Encounter (Signed)
Scheduled per 4/13 los. Pt will receive an updated appt calendar per next visit appt notes

## 2020-11-30 NOTE — Patient Instructions (Signed)
Pegfilgrastim injection What is this medicine? PEGFILGRASTIM (PEG fil gra stim) is a long-acting granulocyte colony-stimulating factor that stimulates the growth of neutrophils, a type of white blood cell important in the body's fight against infection. It is used to reduce the incidence of fever and infection in patients with certain types of cancer who are receiving chemotherapy that affects the bone marrow, and to increase survival after being exposed to high doses of radiation. This medicine may be used for other purposes; ask your health care provider or pharmacist if you have questions. COMMON BRAND NAME(S): Fulphila, Neulasta, Nyvepria, UDENYCA, Ziextenzo What should I tell my health care provider before I take this medicine? They need to know if you have any of these conditions:  kidney disease  latex allergy  ongoing radiation therapy  sickle cell disease  skin reactions to acrylic adhesives (On-Body Injector only)  an unusual or allergic reaction to pegfilgrastim, filgrastim, other medicines, foods, dyes, or preservatives  pregnant or trying to get pregnant  breast-feeding How should I use this medicine? This medicine is for injection under the skin. If you get this medicine at home, you will be taught how to prepare and give the pre-filled syringe or how to use the On-body Injector. Refer to the patient Instructions for Use for detailed instructions. Use exactly as directed. Tell your healthcare provider immediately if you suspect that the On-body Injector may not have performed as intended or if you suspect the use of the On-body Injector resulted in a missed or partial dose. It is important that you put your used needles and syringes in a special sharps container. Do not put them in a trash can. If you do not have a sharps container, call your pharmacist or healthcare provider to get one. Talk to your pediatrician regarding the use of this medicine in children. While this drug  may be prescribed for selected conditions, precautions do apply. Overdosage: If you think you have taken too much of this medicine contact a poison control center or emergency room at once. NOTE: This medicine is only for you. Do not share this medicine with others. What if I miss a dose? It is important not to miss your dose. Call your doctor or health care professional if you miss your dose. If you miss a dose due to an On-body Injector failure or leakage, a new dose should be administered as soon as possible using a single prefilled syringe for manual use. What may interact with this medicine? Interactions have not been studied. This list may not describe all possible interactions. Give your health care provider a list of all the medicines, herbs, non-prescription drugs, or dietary supplements you use. Also tell them if you smoke, drink alcohol, or use illegal drugs. Some items may interact with your medicine. What should I watch for while using this medicine? Your condition will be monitored carefully while you are receiving this medicine. You may need blood work done while you are taking this medicine. Talk to your health care provider about your risk of cancer. You may be more at risk for certain types of cancer if you take this medicine. If you are going to need a MRI, CT scan, or other procedure, tell your doctor that you are using this medicine (On-Body Injector only). What side effects may I notice from receiving this medicine? Side effects that you should report to your doctor or health care professional as soon as possible:  allergic reactions (skin rash, itching or hives, swelling of   the face, lips, or tongue)  back pain  dizziness  fever  pain, redness, or irritation at site where injected  pinpoint red spots on the skin  red or dark-brown urine  shortness of breath or breathing problems  stomach or side pain, or pain at the shoulder  swelling  tiredness  trouble  passing urine or change in the amount of urine  unusual bruising or bleeding Side effects that usually do not require medical attention (report to your doctor or health care professional if they continue or are bothersome):  bone pain  muscle pain This list may not describe all possible side effects. Call your doctor for medical advice about side effects. You may report side effects to FDA at 1-800-FDA-1088. Where should I keep my medicine? Keep out of the reach of children. If you are using this medicine at home, you will be instructed on how to store it. Throw away any unused medicine after the expiration date on the label. NOTE: This sheet is a summary. It may not cover all possible information. If you have questions about this medicine, talk to your doctor, pharmacist, or health care provider.  2021 Elsevier/Gold Standard (2019-08-23 13:20:51)  

## 2020-11-30 NOTE — Telephone Encounter (Signed)
Spoke to pt concerning Nadir check. Pt unable to come in on 4/25. Pt will be seen by Lucianne Lei PA for nadir check on 4/21. Confirmed and scheduled appt

## 2020-12-01 ENCOUNTER — Other Ambulatory Visit: Payer: Managed Care, Other (non HMO)

## 2020-12-02 ENCOUNTER — Encounter: Payer: Self-pay | Admitting: *Deleted

## 2020-12-02 NOTE — Progress Notes (Signed)
RN placed called to [facility] to verify status of Ziextenzo injection.  Pharmacy states medication has been approved and the pharmacist will release the medication, and call the pt to arrange for delivery. Pharmacy states they will contact pt to update her on status as well and arrange delivery.  Total time spent on call: 15 minutes.

## 2020-12-03 ENCOUNTER — Inpatient Hospital Stay: Payer: Managed Care, Other (non HMO)

## 2020-12-03 ENCOUNTER — Ambulatory Visit (HOSPITAL_BASED_OUTPATIENT_CLINIC_OR_DEPARTMENT_OTHER): Admit: 2020-12-03 | Payer: Managed Care, Other (non HMO) | Admitting: General Surgery

## 2020-12-03 ENCOUNTER — Ambulatory Visit: Payer: Managed Care, Other (non HMO) | Admitting: Medical

## 2020-12-03 ENCOUNTER — Other Ambulatory Visit: Payer: Self-pay

## 2020-12-03 ENCOUNTER — Encounter (HOSPITAL_BASED_OUTPATIENT_CLINIC_OR_DEPARTMENT_OTHER): Payer: Self-pay

## 2020-12-03 ENCOUNTER — Inpatient Hospital Stay (HOSPITAL_BASED_OUTPATIENT_CLINIC_OR_DEPARTMENT_OTHER): Payer: Managed Care, Other (non HMO) | Admitting: Medical

## 2020-12-03 ENCOUNTER — Other Ambulatory Visit: Payer: Managed Care, Other (non HMO)

## 2020-12-03 VITALS — BP 132/86 | HR 93 | Temp 98.0°F | Resp 18 | Ht 67.5 in | Wt 163.6 lb

## 2020-12-03 DIAGNOSIS — Z171 Estrogen receptor negative status [ER-]: Secondary | ICD-10-CM

## 2020-12-03 DIAGNOSIS — C50512 Malignant neoplasm of lower-outer quadrant of left female breast: Secondary | ICD-10-CM

## 2020-12-03 DIAGNOSIS — Z5112 Encounter for antineoplastic immunotherapy: Secondary | ICD-10-CM | POA: Diagnosis not present

## 2020-12-03 LAB — CBC WITH DIFFERENTIAL/PLATELET
Abs Immature Granulocytes: 0.14 10*3/uL — ABNORMAL HIGH (ref 0.00–0.07)
Basophils Absolute: 0.1 10*3/uL (ref 0.0–0.1)
Basophils Relative: 2 %
Eosinophils Absolute: 0 10*3/uL (ref 0.0–0.5)
Eosinophils Relative: 0 %
HCT: 41 % (ref 36.0–46.0)
Hemoglobin: 13.6 g/dL (ref 12.0–15.0)
Immature Granulocytes: 5 %
Lymphocytes Relative: 42 %
Lymphs Abs: 1.2 10*3/uL (ref 0.7–4.0)
MCH: 31.9 pg (ref 26.0–34.0)
MCHC: 33.2 g/dL (ref 30.0–36.0)
MCV: 96.2 fL (ref 80.0–100.0)
Monocytes Absolute: 0.4 10*3/uL (ref 0.1–1.0)
Monocytes Relative: 13 %
Neutro Abs: 1.1 10*3/uL — ABNORMAL LOW (ref 1.7–7.7)
Neutrophils Relative %: 38 %
Platelets: 204 10*3/uL (ref 150–400)
RBC: 4.26 MIL/uL (ref 3.87–5.11)
RDW: 11.7 % (ref 11.5–15.5)
WBC: 2.9 10*3/uL — ABNORMAL LOW (ref 4.0–10.5)
nRBC: 0 % (ref 0.0–0.2)

## 2020-12-03 LAB — COMPREHENSIVE METABOLIC PANEL
ALT: 17 U/L (ref 0–44)
AST: 18 U/L (ref 15–41)
Albumin: 3.7 g/dL (ref 3.5–5.0)
Alkaline Phosphatase: 67 U/L (ref 38–126)
Anion gap: 9 (ref 5–15)
BUN: 10 mg/dL (ref 6–20)
CO2: 26 mmol/L (ref 22–32)
Calcium: 9.1 mg/dL (ref 8.9–10.3)
Chloride: 101 mmol/L (ref 98–111)
Creatinine, Ser: 0.73 mg/dL (ref 0.44–1.00)
GFR, Estimated: >60 mL/min (ref >60)
Glucose, Bld: 112 mg/dL — ABNORMAL HIGH (ref 70–99)
Potassium: 4.5 mmol/L (ref 3.5–5.1)
Sodium: 136 mmol/L (ref 135–145)
Total Bilirubin: 0.5 mg/dL (ref 0.3–1.2)
Total Protein: 7.1 g/dL (ref 6.5–8.1)

## 2020-12-03 SURGERY — INSERTION, TUNNELED CENTRAL VENOUS DEVICE, WITH PORT
Anesthesia: General | Site: Breast

## 2020-12-04 ENCOUNTER — Telehealth: Payer: Self-pay | Admitting: *Deleted

## 2020-12-04 NOTE — Telephone Encounter (Signed)
RN placed call to [facility] to confirm shipment of Ziextenzo.  Pharmacy representative stated that a PA has not be approved for home injection. RN provided information from phone call with pharmacy on 12/02/20 who stated medication was approved and would be shipped.  Representative examined pt records and states there is no PA approval on file and the pharmacy would need one prior to being able to fill and ship injection.  RN requested PA form to be faxed to our office.  Pharmacy representative states they are unable to fax any type of forum and that the PA would need to be called in to [phone].  RN will provide PA representative with [place] the phone number to obtain approval.   Total time spent on phone with pharmacy: 35 minutes

## 2020-12-07 ENCOUNTER — Ambulatory Visit: Payer: Managed Care, Other (non HMO) | Admitting: Medical

## 2020-12-07 ENCOUNTER — Other Ambulatory Visit: Payer: Managed Care, Other (non HMO)

## 2020-12-08 ENCOUNTER — Telehealth: Payer: Self-pay | Admitting: *Deleted

## 2020-12-08 ENCOUNTER — Encounter: Payer: Self-pay | Admitting: *Deleted

## 2020-12-08 NOTE — Telephone Encounter (Signed)
[employer] PA request for documentation of clinical response to Pegfilgrastin injection.  Ziextenzo PA faxed 12/07/2020.  Noted afebrile neutropenia per 12/03/2020 CBC-diff result.  Nadir check post 11/30/2020 injection per patient scheduling conflict demonstrates strong need of growth hormone.       [employer] plan response: "Request PA's for oncology primary chemotherapy and supportive drugs  through [employer] web portal or call [phone].  Moving forward [facility] Department is transitioning from phone or fax coverage reviews to Electronic Prior Authorizations (EPA's)."     Unsuccessful through [employer]; "Program directing to submit to [place] expiring request.".  "Rejecting through [place] advising [employer].  We will help since [employer] could not review."   Coverage review questions answered.  Requested urgent review (Up to 72 hours) for Case ID #: [id number].  - Completed [employer] request for quick clinical information to attach for clinician review including patient name, ID for H.I.P.A.A validity with name of medication to [phone].    -"Office will receive faxed notification.   - Notify patient and pharmacy.   - Listing to pharmacy system is automatic.   - If approved, notify pharmacy and patient of shipment location (office or patient).   - [facility] is preferred pharmacy with [place]."  .

## 2020-12-08 NOTE — Addendum Note (Signed)
Addended by: Harle Stanford on: 12/08/2020 06:32 PM   Modules accepted: Level of Service

## 2020-12-08 NOTE — Progress Notes (Signed)
[facility] Progress Note   MONYE RAWLINGS [id number] 05/01/1966 55 y.o.  [name] is managed by Dr. Nicholas Lose  Actively treated with chemotherapy/immunotherapy/hormonal therapy: yes  Current therapy: TCHP  Last treated: 11/25/2020 (cycle 1, day 2)  Next scheduled appointment with provider: 12/18/2020  Assessment: Plan:    Malignant neoplasm of lower-outer quadrant of left breast of female, estrogen receptor negative ([place])   ER negative malignant neoplasm of the left breast: Ms. Arizola presents status post cycle 1, day 2 of TCHP which was dosed on 11/27/2020.  She is scheduled to return on 12/18/2020 for consideration of her next cycle of chemotherapy.  Please see After Visit Summary for patient specific instructions.  Future Appointments  Date Time Provider [place]  12/18/2020  7:45 AM [facility] [place] None  12/18/2020  8:15 AM Nicholas Lose, MD [facility] None  12/18/2020  9:15 AM [facility] INFUSION [facility] None  01/07/2021  9:45 AM [facility] [place] FLUSH [facility] None  01/07/2021 10:30 AM Nicholas Lose, MD [facility] None  01/07/2021 11:30 AM [facility] INFUSION [facility] None  01/29/2021  9:30 AM [facility] [place] FLUSH [facility] None  01/29/2021 10:00 AM Nicholas Lose, MD [facility] None  01/29/2021 11:00 AM [facility] INFUSION [facility] None  02/09/2021  1:15 PM [place], Loel Lofty, DO [facility] None  02/19/2021 10:15 AM [facility] [place] FLUSH [facility] None  02/19/2021 10:45 AM Nicholas Lose, MD [facility] None  02/19/2021 11:45 AM [facility] INFUSION [facility] None    No orders of the defined types were placed in this encounter.      Subjective:   Patient ID:  TAVIONA TRIBE is a 55 y.o. (DOB 1965/09/24) female.  Chief Complaint: No chief complaint on file.   HPI ELOINA SAWINSKI  is a 55 y.o. female with a diagnosis of an ER negative malignant neoplasm of the left breast.  She is managed by Dr. Nicholas Lose and is status  post cycle 1, day 2 of TCHP which was dosed on 11/27/2020.  She reports that she is having fatigue and soreness in her mouth and throat.  She had diarrhea which was decreased with her use of Imodium which then precipitated constipation.  She reports that she had some queasiness but no vomiting following her diarrhea and constipation.  She notes a metallic taste in her mouth.  Her appetite is poor.  She denies fevers, chills, or sweats.  Medications: I have reviewed the patient's current medications.  Allergies:  Allergies  Allergen Reactions  . Caffeine Palpitations    Past Medical History:  Diagnosis Date  . Arrhythmia   . Family history of breast cancer   . Family history of prostate cancer   . Family history of stomach cancer   . Hyperlipidemia   . Thyroid disease     Past Surgical History:  Procedure Laterality Date  . chin implant  1996  . IR IMAGING GUIDED PORT INSERTION  11/24/2020    Family History  Problem Relation Age of Onset  . Breast cancer Mother 62       bilateral  . Atrial fibrillation Father   . Breast cancer Maternal Grandmother 60  . Breast cancer Paternal Grandmother 39  . Breast cancer Maternal Aunt 67  . Prostate cancer Maternal Uncle 68  . Stomach cancer Paternal Grandfather 19  . Heart Problems Maternal Aunt     Social History   Socioeconomic History  . Marital status: Married    Spouse name: Not on file  . Number  of children: Not on file  . Years of education: Not on file  . Highest education level: Not on file  Occupational History  . Not on file  Tobacco Use  . Smoking status: Never Smoker  . Smokeless tobacco: Never Used  Substance and Sexual Activity  . Alcohol use: Yes    Comment: Socially  . Drug use: Never  . Sexual activity: Not on file  Other Topics Concern  . Not on file  Social History Narrative  . Not on file   Social Determinants of Health   Financial Resource Strain: Not on file  Food Insecurity: Not on file   Transportation Needs: Not on file  Physical Activity: Not on file  Stress: Not on file  Social Connections: Not on file  Intimate Partner Violence: Not on file    Past Medical History, Surgical history, Social history, and Family history were reviewed and updated as appropriate.   Please see review of systems for further details on the patient's review from today.   Review of Systems:  Review of Systems  Constitutional: Positive for appetite change and fatigue. Negative for chills, diaphoresis and fever.  HENT: Positive for sore throat. Negative for trouble swallowing and voice change.        Metallic taste  Respiratory: Negative for cough, chest tightness, shortness of breath and wheezing.   Cardiovascular: Negative for chest pain and palpitations.  Gastrointestinal: Positive for constipation, diarrhea and nausea. Negative for abdominal pain and vomiting.  Musculoskeletal: Negative for back pain and myalgias.  Neurological: Negative for dizziness, light-headedness and headaches.    Objective:   Physical Exam:  BP 132/86 (BP Location: Left Arm, Patient Position: Sitting)   Pulse 93   Temp 98 F (36.7 C) (Tympanic)   Resp 18   Ht 5' 7.5" (1.715 m)   Wt 163 lb 9.6 oz (74.2 kg)   SpO2 100%   BMI 25.25 kg/m  ECOG: 1  Physical Exam Constitutional:      General: She is not in acute distress.    Appearance: She is not diaphoretic.  HENT:     Head: Normocephalic and atraumatic.  Eyes:     General: No scleral icterus.       Right eye: No discharge.        Left eye: No discharge.     Conjunctiva/sclera: Conjunctivae normal.  Cardiovascular:     Rate and Rhythm: Normal rate and regular rhythm.     Heart sounds: Normal heart sounds. No murmur heard. No friction rub. No gallop.   Pulmonary:     Effort: Pulmonary effort is normal. No respiratory distress.     Breath sounds: Normal breath sounds. No wheezing or rales.  Skin:    General: Skin is warm and dry.     Findings:  No erythema or rash.  Neurological:     Mental Status: She is alert.     Coordination: Coordination normal.     Gait: Gait normal.  Psychiatric:        Mood and Affect: Mood normal.        Behavior: Behavior normal.        Thought Content: Thought content normal.        Judgment: Judgment normal.     Lab Review:     Component Value Date/Time   NA 136 12/03/2020 1409   K 4.5 12/03/2020 1409   CL 101 12/03/2020 1409   CO2 26 12/03/2020 1409   GLUCOSE 112 (H) 12/03/2020 1409  BUN 10 12/03/2020 1409   CREATININE 0.73 12/03/2020 1409   CREATININE 0.72 11/11/2020 1242   CALCIUM 9.1 12/03/2020 1409   PROT 7.1 12/03/2020 1409   ALBUMIN 3.7 12/03/2020 1409   AST 18 12/03/2020 1409   AST 14 (L) 11/11/2020 1242   ALT 17 12/03/2020 1409   ALT 14 11/11/2020 1242   ALKPHOS 67 12/03/2020 1409   BILITOT 0.5 12/03/2020 1409   BILITOT 0.4 11/11/2020 1242   GFRNONAA >60 12/03/2020 1409   GFRNONAA >60 11/11/2020 1242   GFRAA >60 06/30/2019 1719       Component Value Date/Time   WBC 2.9 (L) 12/03/2020 1409   RBC 4.26 12/03/2020 1409   HGB 13.6 12/03/2020 1409   HGB 13.1 11/11/2020 1242   HCT 41.0 12/03/2020 1409   PLT 204 12/03/2020 1409   PLT 264 11/11/2020 1242   MCV 96.2 12/03/2020 1409   MCH 31.9 12/03/2020 1409   MCHC 33.2 12/03/2020 1409   RDW 11.7 12/03/2020 1409   LYMPHSABS 1.2 12/03/2020 1409   MONOABS 0.4 12/03/2020 1409   EOSABS 0.0 12/03/2020 1409   BASOSABS 0.1 12/03/2020 1409   -------------------------------  Imaging from last 24 hours (if applicable):  Radiology interpretation: MR BREAST BILATERAL W WO CONTRAST INC CAD  Result Date: 11/09/2020 CLINICAL DATA:  Biopsy proven invasive ductal carcinoma far posteriorly in the 3:30 region of the left breast and metastatic 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 LABS:  None obtained at the time of imaging. EXAM: BILATERAL BREAST MRI WITH AND WITHOUT CONTRAST TECHNIQUE: Multiplanar, multisequence MR images of both breasts  were obtained prior to and following the intravenous administration of 8 ml of Gadavist Three-dimensional MR images were rendered by post-processing of the original MR data on an independent workstation. The three-dimensional MR images were interpreted, and findings are reported in the following complete MRI report for this study. Three dimensional images were evaluated at the independent interpreting workstation using the DynaCAD thin client. COMPARISON:  Previous exam(s). FINDINGS: Breast composition: c. Heterogeneous fibroglandular tissue. Background parenchymal enhancement: Moderate. Right breast: No mass or abnormal enhancement. Left breast: Far posteriorly in the lower outer quadrant of the left breast is an irregular enhancing mass measuring 1.5 x 1.0 x 1.3 cm in anterior-posterior, transverse and craniocaudal dimensions (series 14, image 222). Signal void artifact is seen in the mass from the biopsy clip. Lymph nodes: There are 2 enhancing, enlarged abnormal lymph nodes in the left axilla measuring 1.6 and 2.1 cm (series 8, image 124). The more anterior lymph node has a signal void artifact along the posterior margin from the biopsy clip. Ancillary findings:  None. IMPRESSION: 1.5 cm irregular enhancing mass in the lower outer quadrant of the left breast corresponding with the biopsy proven invasive ductal carcinoma. Two enlarged abnormal left axillary lymph nodes corresponding with the axillary metastasis. RECOMMENDATION: Treatment planning of the known left breast invasive ductal carcinoma with metastatic axillary adenopathy is recommended. No abnormal enhancement in the right breast. BI-RADS CATEGORY  6: Known biopsy-proven malignancy. Electronically Signed   By: Lillia Mountain M.D.   On: 11/09/2020 13:07   ECHOCARDIOGRAM COMPLETE  Result Date: 11/27/2020    ECHOCARDIOGRAM REPORT   Patient Name:   SUKI MARCELLUS Date of Exam: 11/25/2020 Medical Rec #:  [id number]     Height:       67.5 in Accession #:     [id number]    Weight:       174.0 lb Date of Birth:  07/31/1966  BSA:          1.917 m Patient Age:    88 years      BP:           131/78 mmHg Patient Gender: F             HR:           75 bpm. Exam Location:  Outpatient Procedure: 2D Echo, 3D Echo, Cardiac Doppler, Color Doppler and Strain Analysis Indications:    Z51.11 Encounter for antineoplastic chemotheraphy  History:        Patient has no prior history of Echocardiogram examinations.                 Abnormal ECG, Arrythmias:Atrial Fibrillation; Risk                 Factors:Hypertension. Breast cancer.  Sonographer:    Roseanna Rainbow Referring Phys: [id number] Nicholas Lose  Sonographer Comments: Technically difficult study due to poor echo windows. IMPRESSIONS  1. Left ventricular ejection fraction by 3D volume is 58 %. The left ventricle has normal function. The left ventricle has no regional wall motion abnormalities. Left ventricular diastolic parameters were normal. The average left ventricular global longitudinal strain is -20.3 %. The global longitudinal strain is normal.  2. Right ventricular systolic function is normal. The right ventricular size is normal. Tricuspid regurgitation signal is inadequate for assessing PA pressure.  3. The mitral valve is grossly normal. Trivial mitral valve regurgitation. No evidence of mitral stenosis.  4. The aortic valve is tricuspid. There is mild calcification of the aortic valve. Aortic valve regurgitation is trivial. No aortic stenosis is present.  5. The inferior vena cava is normal in size with greater than 50% respiratory variability, suggesting right atrial pressure of 3 mmHg. FINDINGS  Left Ventricle: Left ventricular ejection fraction by 3D volume is 58 %. The left ventricle has normal function. The left ventricle has no regional wall motion abnormalities. The average left ventricular global longitudinal strain is -20.3 %. The global  longitudinal strain is normal. The left ventricular internal cavity size was  normal in size. There is no left ventricular hypertrophy. Left ventricular diastolic parameters were normal. Right Ventricle: The right ventricular size is normal. No increase in right ventricular wall thickness. Right ventricular systolic function is normal. Tricuspid regurgitation signal is inadequate for assessing PA pressure. Left Atrium: Left atrial size was normal in size. Right Atrium: Right atrial size was normal in size. Pericardium: There is no evidence of pericardial effusion. Mitral Valve: The mitral valve is grossly normal. Trivial mitral valve regurgitation. No evidence of mitral valve stenosis. Tricuspid Valve: The tricuspid valve is normal in structure. Tricuspid valve regurgitation is trivial. No evidence of tricuspid stenosis. Aortic Valve: The aortic valve is tricuspid. There is mild calcification of the aortic valve. Aortic valve regurgitation is trivial. No aortic stenosis is present. Pulmonic Valve: The pulmonic valve was normal in structure. Pulmonic valve regurgitation is not visualized. No evidence of pulmonic stenosis. Aorta: The aortic root is normal in size and structure. Venous: The inferior vena cava is normal in size with greater than 50% respiratory variability, suggesting right atrial pressure of 3 mmHg. IAS/Shunts: No atrial level shunt detected by color flow Doppler.  LEFT VENTRICLE PLAX 2D LVIDd:         4.40 cm         Diastology LVIDs:         2.60 cm  LV e' medial:    8.38 cm/s LV PW:         0.70 cm         LV E/e' medial:  9.9 LV IVS:        0.80 cm         LV e' lateral:   9.36 cm/s LVOT diam:     1.90 cm         LV E/e' lateral: 8.9 LV SV:         64 LV SV Index:   33              2D LVOT Area:     2.84 cm        Longitudinal                                Strain                                2D Strain GLS  -20.3 % LV Volumes (MOD)               Avg: LV vol d, MOD    66.3 ml A2C:                           3D Volume EF LV vol d, MOD    64.7 ml       LV 3D EF:     Left A4C:                                        ventricular LV vol s, MOD    27.9 ml                    ejection A2C:                                        fraction by LV vol s, MOD    27.9 ml                    3D volume A4C:                                        is 58 %. LV SV MOD A2C:   38.4 ml LV SV MOD A4C:   64.7 ml LV SV MOD BP:    39.9 ml       3D Volume EF:                                3D EF:        58 % RIGHT VENTRICLE             IVC RV S prime:     10.90 cm/s  IVC diam: 1.50 cm TAPSE (M-mode): 2.4 cm LEFT ATRIUM           Index       RIGHT ATRIUM          Index  LA diam:      3.20 cm 1.67 cm/m  RA Area:     8.44 cm LA Vol (A2C): 45.4 ml 23.69 ml/m RA Volume:   15.00 ml 7.83 ml/m LA Vol (A4C): 20.6 ml 10.75 ml/m  AORTIC VALVE LVOT Vmax:   127.00 cm/s LVOT Vmean:  78.800 cm/s LVOT VTI:    0.226 m  AORTA Ao Root diam: 3.30 cm Ao Asc diam:  2.90 cm MITRAL VALVE MV Area (PHT): 2.91 cm    SHUNTS MV Decel Time: 261 msec    Systemic VTI:  0.23 m MV E velocity: 83.20 cm/s  Systemic Diam: 1.90 cm MV A velocity: 96.10 cm/s MV E/A ratio:  0.87 Cherlynn Kaiser MD Electronically signed by Cherlynn Kaiser MD Signature Date/Time: 11/27/2020/7:52:43 AM    Final    IR IMAGING GUIDED PORT INSERTION  Result Date: 11/24/2020 CLINICAL DATA:  Left breast carcinoma, needs durable venous access for planned treatment regimen EXAM: TUNNELED PORT CATHETER PLACEMENT WITH ULTRASOUND AND FLUOROSCOPIC GUIDANCE FLUOROSCOPY TIME:  6 seconds; less than 1 mGy ANESTHESIA/SEDATION: Intravenous Fentanyl 52mcg and Versed 1mg  were administered as conscious sedation during continuous monitoring of the patient's level of consciousness and physiological / cardiorespiratory status by the radiology RN, with a total moderate sedation time of 13 minutes. TECHNIQUE: The procedure, risks, benefits, and alternatives were explained to the patient. Questions regarding the procedure were encouraged and answered. The patient understands and  consents to the procedure. Patency of the right IJ vein was confirmed with ultrasound with image documentation. An appropriate skin site was determined. Skin site was marked. Region was prepped using maximum barrier technique including cap and mask, sterile gown, sterile gloves, large sterile sheet, and Chlorhexidine as cutaneous antisepsis. The region was infiltrated locally with 1% lidocaine. Under real-time ultrasound guidance, the right IJ vein was accessed with a 21 gauge micropuncture needle; the needle tip within the vein was confirmed with ultrasound image documentation. Needle was exchanged over a 018 guidewire for transitional dilator, and vascular measurement was performed. A small incision was made on the right anterior chest wall and a subcutaneous pocket fashioned. The power-injectable port was positioned and its catheter tunneled to the right IJ dermatotomy site. The transitional dilator was exchanged over an Amplatz wire for a peel-away sheath, through which the port catheter, which had been trimmed to the appropriate length, was advanced and positioned under fluoroscopy with its tip at the cavoatrial junction. Spot chest radiograph confirms good catheter position and no pneumothorax. The port was flushed per protocol. The pocket was closed with deep interrupted and subcuticular continuous 3-0 Monocryl sutures. The incisions were covered with Dermabond then covered with a sterile dressing. The patient tolerated the procedure well. COMPLICATIONS: COMPLICATIONS None immediate IMPRESSION: Technically successful right IJ power-injectable port catheter placement. Ready for routine use. Electronically Signed   By: Lucrezia Europe M.D.   On: 11/24/2020 15:30

## 2020-12-09 ENCOUNTER — Telehealth: Payer: Self-pay | Admitting: *Deleted

## 2020-12-09 ENCOUNTER — Encounter: Payer: Self-pay | Admitting: *Deleted

## 2020-12-09 NOTE — Progress Notes (Signed)
Received fax from [facility] requesting recent office notes for pt.  RN successfully faxed office notes including Ziextenzo information to [facility].    The contact number to reach representative in the future:[phone]

## 2020-12-09 NOTE — Telephone Encounter (Signed)
Received PA for home Ziextenzo injection.  RN placed call to [facility] mail order pharmacy to ensure PA was received on their end.  Confirmed PA was accepted and states injection is scheduled to be delivered on 12/16/20.  RN placed call to pt to inform her on injection status.  No answer, LVM to return call to the office.   Total time spent on phone with pharmacy: 12 minutes

## 2020-12-15 ENCOUNTER — Other Ambulatory Visit: Payer: Self-pay

## 2020-12-15 ENCOUNTER — Telehealth: Payer: Self-pay | Admitting: *Deleted

## 2020-12-15 DIAGNOSIS — C50512 Malignant neoplasm of lower-outer quadrant of left female breast: Secondary | ICD-10-CM

## 2020-12-15 DIAGNOSIS — Z171 Estrogen receptor negative status [ER-]: Secondary | ICD-10-CM

## 2020-12-15 DIAGNOSIS — Z95828 Presence of other vascular implants and grafts: Secondary | ICD-10-CM

## 2020-12-15 NOTE — Progress Notes (Signed)
Patient called to report pain to port.  Patient denies any drainage.  Patient reports using her right arm at work excessively yesterday.    MD recommendations to obtain CXR for placement prior to next infusion.  Pt aware, verbalized understanding and will come for CXR on 5/4.

## 2020-12-16 ENCOUNTER — Ambulatory Visit (HOSPITAL_COMMUNITY)
Admission: RE | Admit: 2020-12-16 | Discharge: 2020-12-16 | Disposition: A | Discharge status: Home / Self Care | Payer: Managed Care, Other (non HMO) | Source: Ambulatory Visit | Attending: Hematology and Oncology | Admitting: Hematology and Oncology

## 2020-12-16 ENCOUNTER — Other Ambulatory Visit: Payer: Self-pay

## 2020-12-16 DIAGNOSIS — C50512 Malignant neoplasm of lower-outer quadrant of left female breast: Secondary | ICD-10-CM | POA: Diagnosis present

## 2020-12-16 DIAGNOSIS — Z171 Estrogen receptor negative status [ER-]: Secondary | ICD-10-CM | POA: Insufficient documentation

## 2020-12-16 DIAGNOSIS — Z95828 Presence of other vascular implants and grafts: Secondary | ICD-10-CM | POA: Insufficient documentation

## 2020-12-17 NOTE — Progress Notes (Signed)
Patient Care Team: Alroy Dust, L.Marlou Sa, MD as PCP - General (Family Medicine) Jovita Kussmaul, MD as Consulting Physician (General Surgery) Nicholas Lose, MD as Consulting Physician (Hematology and Oncology) Gery Pray, MD as Consulting Physician (Radiation Oncology) Mauro Kaufmann, RN as Oncology Nurse Navigator Rockwell Germany, RN as Oncology Nurse Navigator  DIAGNOSIS:    ICD-10-CM   1. Malignant neoplasm of lower-outer quadrant of left breast of female, estrogen receptor negative ([place])  C50.512    Z17.1     SUMMARY OF ONCOLOGIC HISTORY: Oncology History  Malignant neoplasm of lower-outer quadrant of left breast of female, estrogen receptor negative ([place])  11/04/2020 Initial Diagnosis   Screening mammogram showed a possible left breast mass and enlarged axillary lymph nodes. Diagnostic mammogram and US showed a 1.4cm mass at the 3:30 position in the left breast and 2 abnormal lymph nodes in the left axilla. 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   11/11/2020 Cancer Staging   Staging form: Breast, AJCC 8th Edition - Clinical stage from 11/11/2020: Stage IIA (cT1c, cN1, cM0, G3, ER-, PR-, HER2+) - Signed by Nicholas Lose, MD on 11/11/2020 Stage prefix: Initial diagnosis Histologic grading system: 3 grade system Laterality: Left Staged by: Pathologist and managing physician Stage used in treatment planning: Yes National guidelines used in treatment planning: Yes Type of national guideline used in treatment planning: NCCN   11/27/2020 -  Chemotherapy    Patient is on Treatment Plan: BREAST  DOCETAXEL + CARBOPLATIN + TRASTUZUMAB + PERTUZUMAB  (TCHP) Q21D         CHIEF COMPLIANT: Cycle 2 TCH Perjeta  INTERVAL HISTORY: Kristina Harvey is a 55 y.o. with above-mentioned history of left breast cancer currently on neoadjuvant chemotherapy with [place]. She presents to the clinic today for  cycle 2.   Her major complaint is related to loose stools 7-19 times per day.  Previously when she took Imodium its topped her bowel movements for several days and therefore she is reluctant to take any antidiarrheal medications.  She started Metamucil which appears to be helping her.  She wants to increase the Metamucil further to see if it completely stabilizes her bowel movements.  She had problems with loose stools even prior to starting chemo and after starting with chemo her symptoms have gotten markedly worse.  Did not have any nausea issues.  Metallic taste and decreased appetite.  ALLERGIES:  is allergic to caffeine.  MEDICATIONS:  Current Outpatient Medications  Medication Sig Dispense Refill  . ALPRAZolam (XANAX) 0.25 MG tablet Take 0.25 mg by mouth at bedtime as needed for sleep.    . colestipol (COLESTID) 1 g tablet Take 2 g by mouth at bedtime.    Marland Kitchen dexamethasone (DECADRON) 4 MG tablet Take 1 tablet (4 mg total) by mouth 2 (two) times daily. Take 1 tablet day before chemo and 1 tablet day after chemo with food 12 tablet 0  . levothyroxine (SYNTHROID) 75 MCG tablet Take 75 mcg by mouth daily before breakfast.    . lidocaine-prilocaine (EMLA) cream Apply to affected area once 30 g 3  . metoprolol succinate (TOPROL-XL) 25 MG 24 hr tablet TAKE ONE TABLET BY MOUTH DAILY WITH OR IMMEDIATELY FOLLOWING A MEAL (Patient taking differently: Take 25 mg by mouth daily.) 90 tablet 1  . ondansetron (ZOFRAN) 8 MG tablet Take 1 tablet (8 mg total) by mouth 2 (two) times daily as needed (Nausea  or vomiting). Start on the third day after chemotherapy. 30 tablet 1  . [START ON 12/28/2020] pegfilgrastim-bmez (ZIEXTENZO) 6 MG/0.6ML injection Inject 0.6 mLs (6 mg total) into the skin once for 1 dose. Administer subcutaneously 24 hours after the completion of chemotherapy. 0.6 mL 4  . prochlorperazine (COMPAZINE) 10 MG tablet Take 1 tablet (10 mg total) by mouth every 6 (six) hours as needed (Nausea or  vomiting). 30 tablet 1   No current facility-administered medications for this visit.    PHYSICAL EXAMINATION: ECOG PERFORMANCE STATUS: 1 - Symptomatic but completely ambulatory  Vitals:   12/18/20 0813  BP: 137/66  Pulse: 85  Resp: 16  Temp: (!) 97.3 F (36.3 C)  SpO2: 100%   Filed Weights   12/18/20 0813  Weight: 165 lb 1.6 oz (74.9 kg)     LABORATORY DATA:  I have reviewed the data as listed CMP Latest Ref Rng & Units 12/18/2020 12/03/2020 11/27/2020  Glucose 70 - 99 mg/dL 105(H) 112(H) 115(H)  BUN 6 - 20 mg/dL _0 Creatinine 0.44 - 1.00 mg/dL 0.67 0.73 0.71  Sodium 135 - 145 mmol/L 140 136 140  Potassium 3.5 - 5.1 mmol/L 3.6 4.5 3.8  Chloride 98 - 111 mmol/L 107 101 104  CO2 22 - 32 mmol/L _1 Calcium 8.9 - 10.3 mg/dL 9.3 9.1 9.8  Total Protein 6.5 - 8.1 g/dL 6.9 7.1 7.5  Total Bilirubin 0.3 - 1.2 mg/dL 0.3 0.5 0.3  Alkaline Phos 38 - 126 U/L 65 67 68  AST 15 - 41 U/L _2 ALT 0 - 44 U/L _3 Lab Results  Component Value Date   WBC 11.3 (H) 12/18/2020   HGB 11.7 (L) 12/18/2020   HCT 36.3 12/18/2020   MCV 97.6 12/18/2020   PLT 346 12/18/2020   NEUTROABS 7.8 (H) 12/18/2020    ASSESSMENT & PLAN:  Malignant neoplasm of lower-outer quadrant of left breast of female, estrogen receptor negative ([place]) 11/04/2020:Screening mammogram showed a possible left breast mass and enlarged axillary lymph nodes. Diagnostic mammogram and US showed a 1.4cm mass at the 3:30 position in the left breast and 2 abnormal lymph nodes in the left axilla. 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  Treatment Plan: 1. Neoadjuvant chemotherapy with TCH Perjeta 6 cycles followed by Herceptin Perjeta maintenance versus Kadcyla maintenance (based on response to neoadjuvant chemo) for 1 year 2. Followed by breast conserving surgery if possible withtargeted node dissection versus  ALND 3. Followed by adjuvant radiation therapy 4.Consideration for neratinib ------------------------------------------------------------------------------------------------------------------------------- Current Treatment: Cycle 2 TCHP  (started 11/27/20) Chemo Toxicities: 1.  Severe diarrhea: She does not want to take antidiarrheal medication because she gets severely constipated from them.  She is taking Metamucil and wants to increase it to see if she can handle it.  If she continues to have profound diarrhea then we have to stop Perjeta.  In spite of the diarrhea, she is able to maintain her electrolytes very well. 2. anemia: Monitoring her hemoglobin 3.  Alopecia  RTC in 3 weeks for cycle 3.    No orders of the defined types were placed in this encounter.  The patient has a good understanding of the overall plan. she agrees with it. she will call with any problems that may develop before the next visit here.  Total time spent: 30 mins including face to face  time and time spent for planning, charting and coordination of care  Rulon Eisenmenger, MD, MPH 12/18/2020  I, Molly Dorshimer, am acting as scribe for Dr. Nicholas Lose.  I have reviewed the above documentation for accuracy and completeness, and I agree with the above.

## 2020-12-17 NOTE — Assessment & Plan Note (Signed)
11/04/2020:Screening mammogram showed a possible left breast mass and enlarged axillary lymph nodes. Diagnostic mammogram and US showed a 1.4cm mass at the 3:30 position in the left breast and 2 abnormal lymph nodes in the left axilla. 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  Treatment Plan: 1. Neoadjuvant chemotherapy with TCH Perjeta 6 cycles followed by Herceptin Perjeta maintenance versus Kadcyla maintenance (based on response to neoadjuvant chemo) for 1 year 2. Followed by breast conserving surgery if possible withtargeted node dissection versus ALND 3. Followed by adjuvant radiation therapy 4.Consideration for neratinib ------------------------------------------------------------------------------------------------------------------------------- Current Treatment: Cycle 2 TCHP  (started 11/27/20) Chemo Toxicities:  RTC in 3 weeks for cycle 3.

## 2020-12-18 ENCOUNTER — Inpatient Hospital Stay (HOSPITAL_BASED_OUTPATIENT_CLINIC_OR_DEPARTMENT_OTHER): Payer: Managed Care, Other (non HMO) | Admitting: Hematology and Oncology

## 2020-12-18 ENCOUNTER — Inpatient Hospital Stay: Payer: Managed Care, Other (non HMO) | Attending: Hematology and Oncology

## 2020-12-18 ENCOUNTER — Inpatient Hospital Stay: Payer: Managed Care, Other (non HMO)

## 2020-12-18 ENCOUNTER — Other Ambulatory Visit: Payer: Self-pay

## 2020-12-18 DIAGNOSIS — D649 Anemia, unspecified: Secondary | ICD-10-CM | POA: Diagnosis not present

## 2020-12-18 DIAGNOSIS — Z79899 Other long term (current) drug therapy: Secondary | ICD-10-CM | POA: Insufficient documentation

## 2020-12-18 DIAGNOSIS — C50512 Malignant neoplasm of lower-outer quadrant of left female breast: Secondary | ICD-10-CM | POA: Diagnosis present

## 2020-12-18 DIAGNOSIS — Z171 Estrogen receptor negative status [ER-]: Secondary | ICD-10-CM | POA: Insufficient documentation

## 2020-12-18 DIAGNOSIS — Z5112 Encounter for antineoplastic immunotherapy: Secondary | ICD-10-CM | POA: Insufficient documentation

## 2020-12-18 DIAGNOSIS — R197 Diarrhea, unspecified: Secondary | ICD-10-CM | POA: Diagnosis not present

## 2020-12-18 DIAGNOSIS — Z95828 Presence of other vascular implants and grafts: Secondary | ICD-10-CM

## 2020-12-18 DIAGNOSIS — Z5111 Encounter for antineoplastic chemotherapy: Secondary | ICD-10-CM | POA: Insufficient documentation

## 2020-12-18 DIAGNOSIS — Z7952 Long term (current) use of systemic steroids: Secondary | ICD-10-CM | POA: Insufficient documentation

## 2020-12-18 LAB — CBC WITH DIFFERENTIAL/PLATELET
Abs Immature Granulocytes: 0.03 10*3/uL (ref 0.00–0.07)
Basophils Absolute: 0 10*3/uL (ref 0.0–0.1)
Basophils Relative: 0 %
Eosinophils Absolute: 0 10*3/uL (ref 0.0–0.5)
Eosinophils Relative: 0 %
HCT: 36.3 % (ref 36.0–46.0)
Hemoglobin: 11.7 g/dL — ABNORMAL LOW (ref 12.0–15.0)
Immature Granulocytes: 0 %
Lymphocytes Relative: 20 %
Lymphs Abs: 2.3 10*3/uL (ref 0.7–4.0)
MCH: 31.5 pg (ref 26.0–34.0)
MCHC: 32.2 g/dL (ref 30.0–36.0)
MCV: 97.6 fL (ref 80.0–100.0)
Monocytes Absolute: 1.1 10*3/uL — ABNORMAL HIGH (ref 0.1–1.0)
Monocytes Relative: 10 %
Neutro Abs: 7.8 10*3/uL — ABNORMAL HIGH (ref 1.7–7.7)
Neutrophils Relative %: 70 %
Platelets: 346 10*3/uL (ref 150–400)
RBC: 3.72 MIL/uL — ABNORMAL LOW (ref 3.87–5.11)
RDW: 12.5 % (ref 11.5–15.5)
WBC: 11.3 10*3/uL — ABNORMAL HIGH (ref 4.0–10.5)
nRBC: 0 % (ref 0.0–0.2)

## 2020-12-18 LAB — COMPREHENSIVE METABOLIC PANEL
ALT: 16 U/L (ref 0–44)
AST: 16 U/L (ref 15–41)
Albumin: 3.7 g/dL (ref 3.5–5.0)
Alkaline Phosphatase: 65 U/L (ref 38–126)
Anion gap: 10 (ref 5–15)
BUN: 13 mg/dL (ref 6–20)
CO2: 23 mmol/L (ref 22–32)
Calcium: 9.3 mg/dL (ref 8.9–10.3)
Chloride: 107 mmol/L (ref 98–111)
Creatinine, Ser: 0.67 mg/dL (ref 0.44–1.00)
GFR, Estimated: >60 mL/min (ref >60)
Glucose, Bld: 105 mg/dL — ABNORMAL HIGH (ref 70–99)
Potassium: 3.6 mmol/L (ref 3.5–5.1)
Sodium: 140 mmol/L (ref 135–145)
Total Bilirubin: 0.3 mg/dL (ref 0.3–1.2)
Total Protein: 6.9 g/dL (ref 6.5–8.1)

## 2020-12-18 MED ORDER — SODIUM CHLORIDE 0.9 % IV SOLN
10.0000 mg | Freq: Once | INTRAVENOUS | Status: AC
  Administered 2020-12-18: 10 mg via INTRAVENOUS
  Filled 2020-12-18: qty 10

## 2020-12-18 MED ORDER — ACETAMINOPHEN 325 MG PO TABS
ORAL_TABLET | ORAL | Status: AC
  Filled 2020-12-18: qty 2

## 2020-12-18 MED ORDER — ACETAMINOPHEN 325 MG PO TABS
650.0000 mg | ORAL_TABLET | Freq: Once | ORAL | Status: AC
  Administered 2020-12-18: 650 mg via ORAL

## 2020-12-18 MED ORDER — TRASTUZUMAB-ANNS CHEMO 150 MG IV SOLR
450.0000 mg | Freq: Once | INTRAVENOUS | Status: AC
  Administered 2020-12-18: 450 mg via INTRAVENOUS
  Filled 2020-12-18: qty 21.43

## 2020-12-18 MED ORDER — METHYLPREDNISOLONE SODIUM SUCC 125 MG IJ SOLR
INTRAMUSCULAR | Status: AC
  Filled 2020-12-18: qty 2

## 2020-12-18 MED ORDER — DIPHENHYDRAMINE HCL 25 MG PO CAPS
ORAL_CAPSULE | ORAL | Status: AC
  Filled 2020-12-18: qty 1

## 2020-12-18 MED ORDER — PALONOSETRON HCL INJECTION 0.25 MG/5ML
0.2500 mg | Freq: Once | INTRAVENOUS | Status: AC
  Administered 2020-12-18: 0.25 mg via INTRAVENOUS

## 2020-12-18 MED ORDER — SODIUM CHLORIDE 0.9% FLUSH
10.0000 mL | Freq: Once | INTRAVENOUS | Status: AC
Start: 2020-12-18 — End: 2020-12-18
  Administered 2020-12-18: 10 mL via INTRAVENOUS
  Filled 2020-12-18: qty 10

## 2020-12-18 MED ORDER — SODIUM CHLORIDE 0.9 % IV SOLN
Freq: Once | INTRAVENOUS | Status: AC
  Filled 2020-12-18: qty 250

## 2020-12-18 MED ORDER — SODIUM CHLORIDE 0.9 % IV SOLN
75.0000 mg/m2 | Freq: Once | INTRAVENOUS | Status: AC
  Administered 2020-12-18: 140 mg via INTRAVENOUS
  Filled 2020-12-18: qty 14

## 2020-12-18 MED ORDER — HEPARIN SOD (PORK) LOCK FLUSH 100 UNIT/ML IV SOLN
500.0000 [IU] | Freq: Once | INTRAVENOUS | Status: AC | PRN
  Administered 2020-12-18: 500 [IU]
  Filled 2020-12-18: qty 5

## 2020-12-18 MED ORDER — SODIUM CHLORIDE 0.9 % IV SOLN
724.2000 mg | Freq: Once | INTRAVENOUS | Status: AC
  Administered 2020-12-18: 720 mg via INTRAVENOUS
  Filled 2020-12-18: qty 72

## 2020-12-18 MED ORDER — SODIUM CHLORIDE 0.9% FLUSH
10.0000 mL | INTRAVENOUS | Status: DC | PRN
  Administered 2020-12-18: 10 mL
  Filled 2020-12-18: qty 10

## 2020-12-18 MED ORDER — DIPHENHYDRAMINE HCL 25 MG PO CAPS
25.0000 mg | ORAL_CAPSULE | Freq: Once | ORAL | Status: AC
  Administered 2020-12-18: 25 mg via ORAL

## 2020-12-18 MED ORDER — SODIUM CHLORIDE 0.9 % IV SOLN
420.0000 mg | Freq: Once | INTRAVENOUS | Status: AC
  Administered 2020-12-18: 420 mg via INTRAVENOUS
  Filled 2020-12-18: qty 14

## 2020-12-18 MED ORDER — SODIUM CHLORIDE 0.9 % IV SOLN
150.0000 mg | Freq: Once | INTRAVENOUS | Status: AC
  Administered 2020-12-18: 150 mg via INTRAVENOUS
  Filled 2020-12-18: qty 150

## 2020-12-18 MED ORDER — PALONOSETRON HCL INJECTION 0.25 MG/5ML
INTRAVENOUS | Status: AC
  Filled 2020-12-18: qty 5

## 2020-12-18 NOTE — Patient Instructions (Signed)
[facility]COL[facility]  Discharge Instructions: °Thank you for choosing [facility] to provide your oncology and hematology care.  ° °If you have a lab appointment with the Cancer Center, please go directly to the Cancer Center and check in at the registration area. °  °Wear comfortable clothing and clothing appropriate for easy access to any Portacath or PICC line.  ° °We strive to give you quality time with your provider. You may need to reschedule your appointment if you arrive late (15 or more minutes).  Arriving late affects you and other patients whose appointments are after yours.  Also, if you miss three or more appointments without notifying the office, you may be dismissed from the clinic at the provider’s discretion.    °  °For prescription refill requests, have your pharmacy contact our office and allow 72 hours for refills to be completed.   ° °Today you received the following chemotherapy and/or immunotherapy agents: trastuzumab, pertuzumab, docetaxel, and carboplatin    °  °To help prevent nausea and vomiting after your treatment, we encourage you to take your nausea medication as directed. ° °BELOW ARE SYMPTOMS THAT SHOULD BE REPORTED IMMEDIATELY: °*FEVER GREATER THAN 100.4 F (38 °C) OR HIGHER °*CHILLS OR SWEATING °*NAUSEA AND VOMITING THAT IS NOT CONTROLLED WITH YOUR NAUSEA MEDICATION °*UNUSUAL SHORTNESS OF BREATH °*UNUSUAL BRUISING OR BLEEDING °*URINARY PROBLEMS (pain or burning when urinating, or frequent urination) °*BOWEL PROBLEMS (unusual diarrhea, constipation, pain near the anus) °TENDERNESS IN MOUTH AND THROAT WITH OR WITHOUT PRESENCE OF ULCERS (sore throat, sores in mouth, or a toothache) °UNUSUAL RASH, SWELLING OR PAIN  °UNUSUAL VAGINAL DISCHARGE OR ITCHING  ° °Items with * indicate a potential emergency and should be followed up as soon as possible or go to the Emergency Department if any problems should occur. ° °Please show the CHEMOTHERAPY ALERT CARD  or IMMUNOTHERAPY ALERT CARD at check-in to the Emergency Department and triage nurse. ° °Should you have questions after your visit or need to cancel or reschedule your appointment, please contact [facility]  Dept: [phone]  and follow the prompts.  Office hours are 8:00 a.m. to 4:30 p.m. Monday - Friday. Please note that voicemails left after 4:00 p.m. may not be returned until the following business day.  We are closed weekends and major holidays. You have access to a nurse at all times for urgent questions. Please call the main number to the clinic Dept: [phone] and follow the prompts. ° ° °For any non-urgent questions, you may also contact your provider using MyChart. We now offer e-Visits for anyone 18 and older to request care online for non-urgent symptoms. For details visit [name]. °  °Also download the MyChart app! Go to the app store, search "MyChart", open the app, select [facility], and log in with your MyChart username and password. ° °Due to Covid, a mask is required upon entering the hospital/clinic. If you do not have a mask, one will be given to you upon arrival. For doctor visits, patients may have 1 support person aged 18 or older with them. For treatment visits, patients cannot have anyone with them due to current Covid guidelines and our immunocompromised population.  ° °

## 2020-12-21 ENCOUNTER — Ambulatory Visit: Payer: Self-pay

## 2021-01-06 NOTE — Assessment & Plan Note (Signed)
11/04/2020:Screening mammogram showed a possible left breast mass and enlarged axillary lymph nodes. Diagnostic mammogram and US showed a 1.4cm mass at the 3:30 position in the left breast and 2 abnormal lymph nodes in the left axilla. 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  Treatment Plan: 1.Neoadjuvant chemotherapy with TCH Perjeta 6 cycles followed by Herceptin Perjeta maintenance versus Kadcyla maintenance (based on response to neoadjuvant chemo) for 1 year 2. Followed by breast conserving surgery if possible withtargeted node dissection versus ALND 3. Followed by adjuvant radiation therapy 4.Consideration for neratinib ------------------------------------------------------------------------------------------------------------------------------- Current Treatment: Cycle 3 TCHP  (started 11/27/20) Chemo Toxicities: 1.  Severe diarrhea: She does not want to take antidiarrheal medication because she gets severely constipated from them.  She is taking Metamucil and wants to increase it to see if she can handle it.  If she continues to have profound diarrhea then we have to stop Perjeta.  In spite of the diarrhea, she is able to maintain her electrolytes very well. 2. anemia: Monitoring her hemoglobin 3.  Alopecia  RTC in 3 weeks for cycle 4

## 2021-01-06 NOTE — Progress Notes (Signed)
Patient Care Team: Alroy Dust, L.Marlou Sa, MD as PCP - General (Family Medicine) Jovita Kussmaul, MD as Consulting Physician (General Surgery) Nicholas Lose, MD as Consulting Physician (Hematology and Oncology) Gery Pray, MD as Consulting Physician (Radiation Oncology) Mauro Kaufmann, RN as Oncology Nurse Navigator Rockwell Germany, RN as Oncology Nurse Navigator  DIAGNOSIS:    ICD-10-CM   1. Malignant neoplasm of lower-outer quadrant of left breast of female, estrogen receptor negative ([place])  C50.512    Z17.1     SUMMARY OF ONCOLOGIC HISTORY: Oncology History  Malignant neoplasm of lower-outer quadrant of left breast of female, estrogen receptor negative ([place])  11/04/2020 Initial Diagnosis   Screening mammogram showed a possible left breast mass and enlarged axillary lymph nodes. Diagnostic mammogram and US showed a 1.4cm mass at the 3:30 position in the left breast and 2 abnormal lymph nodes in the left axilla. 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   11/11/2020 Cancer Staging   Staging form: Breast, AJCC 8th Edition - Clinical stage from 11/11/2020: Stage IIA (cT1c, cN1, cM0, G3, ER-, PR-, HER2+) - Signed by Nicholas Lose, MD on 11/11/2020 Stage prefix: Initial diagnosis Histologic grading system: 3 grade system Laterality: Left Staged by: Pathologist and managing physician Stage used in treatment planning: Yes National guidelines used in treatment planning: Yes Type of national guideline used in treatment planning: NCCN   11/27/2020 -  Chemotherapy    Patient is on Treatment Plan: BREAST  DOCETAXEL + CARBOPLATIN + TRASTUZUMAB + PERTUZUMAB  (TCHP) Q21D         CHIEF COMPLIANT: Cycle 3TCH Perjeta  INTERVAL HISTORY: Kristina Harvey is a 55 y.o. with above-mentioned history of left breast cancer currently on neoadjuvant chemotherapy with [place]. She presents to the clinic todayfor  cycle 3.  ALLERGIES:  is allergic to caffeine.  MEDICATIONS:  Current Outpatient Medications  Medication Sig Dispense Refill  . ALPRAZolam (XANAX) 0.25 MG tablet Take 0.25 mg by mouth at bedtime as needed for sleep.    . colestipol (COLESTID) 1 g tablet Take 2 g by mouth at bedtime.    Marland Kitchen dexamethasone (DECADRON) 4 MG tablet Take 1 tablet (4 mg total) by mouth 2 (two) times daily. Take 1 tablet day before chemo and 1 tablet day after chemo with food 12 tablet 0  . levothyroxine (SYNTHROID) 75 MCG tablet Take 75 mcg by mouth daily before breakfast.    . lidocaine-prilocaine (EMLA) cream Apply to affected area once 30 g 3  . metoprolol succinate (TOPROL-XL) 25 MG 24 hr tablet TAKE ONE TABLET BY MOUTH DAILY WITH OR IMMEDIATELY FOLLOWING A MEAL (Patient taking differently: Take 25 mg by mouth daily.) 90 tablet 1  . ondansetron (ZOFRAN) 8 MG tablet Take 1 tablet (8 mg total) by mouth 2 (two) times daily as needed (Nausea or vomiting). Start on the third day after chemotherapy. 30 tablet 1  . prochlorperazine (COMPAZINE) 10 MG tablet Take 1 tablet (10 mg total) by mouth every 6 (six) hours as needed (Nausea or vomiting). 30 tablet 1   No current facility-administered medications for this visit.    PHYSICAL EXAMINATION: ECOG PERFORMANCE STATUS: 1 - Symptomatic but completely ambulatory  Vitals:   01/07/21 1029  BP: 140/71  Pulse: 88  Resp: 18  Temp: 97.7 F (36.5 C)  SpO2: 100%   Filed Weights   01/07/21 1029  Weight: 165 lb (74.8 kg)  LABORATORY DATA:  I have reviewed the data as listed CMP Latest Ref Rng & Units 12/18/2020 12/03/2020 11/27/2020  Glucose 70 - 99 mg/dL 105(H) 112(H) 115(H)  BUN 6 - 20 mg/dL 13 10 14   Creatinine 0.44 - 1.00 mg/dL 0.67 0.73 0.71  Sodium 135 - 145 mmol/L 140 136 140  Potassium 3.5 - 5.1 mmol/L 3.6 4.5 3.8  Chloride 98 - 111 mmol/L 107 101 104  CO2 22 - 32 mmol/L 23 26 23   Calcium 8.9 - 10.3 mg/dL 9.3 9.1 9.8  Total Protein 6.5 - 8.1 g/dL 6.9 7.1 7.5   Total Bilirubin 0.3 - 1.2 mg/dL 0.3 0.5 0.3  Alkaline Phos 38 - 126 U/L 65 67 68  AST 15 - 41 U/L 16 18 16   ALT 0 - 44 U/L 16 17 12     Lab Results  Component Value Date   WBC 11.0 (H) 01/07/2021   HGB 10.7 (L) 01/07/2021   HCT 32.6 (L) 01/07/2021   MCV 99.7 01/07/2021   PLT 207 01/07/2021   NEUTROABS 7.5 01/07/2021    ASSESSMENT & PLAN:  Malignant neoplasm of lower-outer quadrant of left breast of female, estrogen receptor negative ([place]) 11/04/2020:Screening mammogram showed a possible left breast mass and enlarged axillary lymph nodes. Diagnostic mammogram and US showed a 1.4cm mass at the 3:30 position in the left breast and 2 abnormal lymph nodes in the left axilla. 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  Treatment Plan: 1.Neoadjuvant chemotherapy with TCH Perjeta 6 cycles followed by Herceptin Perjeta maintenance versus Kadcyla maintenance (based on response to neoadjuvant chemo) for 1 year 2. Followed by breast conserving surgery if possible withtargeted node dissection versus ALND 3. Followed by adjuvant radiation therapy 4.Consideration for neratinib ------------------------------------------------------------------------------------------------------------------------------- Current Treatment: Cycle 3 TCHP  (started 11/27/20) Chemo Toxicities: 1.  Severe diarrhea: Diarrhea is now more manageable with probiotics and colestipol along with Metamucil. 2. anemia: Monitoring her hemoglobin today is 10.7 3.  Alopecia  She did not have any nausea or fatigue issues after last chemo. RTC in 3 weeks for cycle 4     No orders of the defined types were placed in this encounter.  The patient has a good understanding of the overall plan. she agrees with it. she will call with any problems that may develop before the next visit here.  Total time spent: 30 mins including face to face  time and time spent for planning, charting and coordination of care  Rulon Eisenmenger, MD, MPH 01/07/2021  I, Cloyde Reams Dorshimer, am acting as scribe for Dr. Nicholas Lose.  I have reviewed the above documentation for accuracy and completeness, and I agree with the above.

## 2021-01-07 ENCOUNTER — Inpatient Hospital Stay (HOSPITAL_BASED_OUTPATIENT_CLINIC_OR_DEPARTMENT_OTHER): Payer: Managed Care, Other (non HMO) | Admitting: Hematology and Oncology

## 2021-01-07 ENCOUNTER — Other Ambulatory Visit: Payer: Self-pay

## 2021-01-07 ENCOUNTER — Inpatient Hospital Stay: Payer: Managed Care, Other (non HMO)

## 2021-01-07 VITALS — BP 108/64 | HR 76 | Resp 16

## 2021-01-07 DIAGNOSIS — Z95828 Presence of other vascular implants and grafts: Secondary | ICD-10-CM

## 2021-01-07 DIAGNOSIS — Z171 Estrogen receptor negative status [ER-]: Secondary | ICD-10-CM

## 2021-01-07 DIAGNOSIS — Z5112 Encounter for antineoplastic immunotherapy: Secondary | ICD-10-CM | POA: Diagnosis not present

## 2021-01-07 DIAGNOSIS — C50512 Malignant neoplasm of lower-outer quadrant of left female breast: Secondary | ICD-10-CM

## 2021-01-07 LAB — COMPREHENSIVE METABOLIC PANEL
ALT: 18 U/L (ref 0–44)
AST: 16 U/L (ref 15–41)
Albumin: 3.5 g/dL (ref 3.5–5.0)
Alkaline Phosphatase: 67 U/L (ref 38–126)
Anion gap: 8 (ref 5–15)
BUN: 10 mg/dL (ref 6–20)
CO2: 25 mmol/L (ref 22–32)
Calcium: 9.4 mg/dL (ref 8.9–10.3)
Chloride: 106 mmol/L (ref 98–111)
Creatinine, Ser: 0.6 mg/dL (ref 0.44–1.00)
GFR, Estimated: >60 mL/min (ref >60)
Glucose, Bld: 94 mg/dL (ref 70–99)
Potassium: 3.6 mmol/L (ref 3.5–5.1)
Sodium: 139 mmol/L (ref 135–145)
Total Bilirubin: 0.6 mg/dL (ref 0.3–1.2)
Total Protein: 6.6 g/dL (ref 6.5–8.1)

## 2021-01-07 LAB — CBC WITH DIFFERENTIAL/PLATELET
Abs Immature Granulocytes: 0.05 10*3/uL (ref 0.00–0.07)
Basophils Absolute: 0 10*3/uL (ref 0.0–0.1)
Basophils Relative: 0 %
Eosinophils Absolute: 0 10*3/uL (ref 0.0–0.5)
Eosinophils Relative: 0 %
HCT: 32.6 % — ABNORMAL LOW (ref 36.0–46.0)
Hemoglobin: 10.7 g/dL — ABNORMAL LOW (ref 12.0–15.0)
Immature Granulocytes: 1 %
Lymphocytes Relative: 19 %
Lymphs Abs: 2.1 10*3/uL (ref 0.7–4.0)
MCH: 32.7 pg (ref 26.0–34.0)
MCHC: 32.8 g/dL (ref 30.0–36.0)
MCV: 99.7 fL (ref 80.0–100.0)
Monocytes Absolute: 1.3 10*3/uL — ABNORMAL HIGH (ref 0.1–1.0)
Monocytes Relative: 12 %
Neutro Abs: 7.5 10*3/uL (ref 1.7–7.7)
Neutrophils Relative %: 68 %
Platelets: 207 10*3/uL (ref 150–400)
RBC: 3.27 MIL/uL — ABNORMAL LOW (ref 3.87–5.11)
RDW: 14.7 % (ref 11.5–15.5)
WBC: 11 10*3/uL — ABNORMAL HIGH (ref 4.0–10.5)
nRBC: 0 % (ref 0.0–0.2)

## 2021-01-07 MED ORDER — SODIUM CHLORIDE 0.9 % IV SOLN
420.0000 mg | Freq: Once | INTRAVENOUS | Status: AC
  Administered 2021-01-07: 420 mg via INTRAVENOUS
  Filled 2021-01-07: qty 14

## 2021-01-07 MED ORDER — SODIUM CHLORIDE 0.9 % IV SOLN
75.0000 mg/m2 | Freq: Once | INTRAVENOUS | Status: AC
  Administered 2021-01-07: 140 mg via INTRAVENOUS
  Filled 2021-01-07: qty 14

## 2021-01-07 MED ORDER — SODIUM CHLORIDE 0.9 % IV SOLN
724.2000 mg | Freq: Once | INTRAVENOUS | Status: AC
  Administered 2021-01-07: 720 mg via INTRAVENOUS
  Filled 2021-01-07: qty 72

## 2021-01-07 MED ORDER — SODIUM CHLORIDE 0.9 % IV SOLN
150.0000 mg | Freq: Once | INTRAVENOUS | Status: AC
  Administered 2021-01-07: 150 mg via INTRAVENOUS
  Filled 2021-01-07: qty 150

## 2021-01-07 MED ORDER — SODIUM CHLORIDE 0.9% FLUSH
10.0000 mL | Freq: Once | INTRAVENOUS | Status: AC
  Administered 2021-01-07: 10 mL
  Filled 2021-01-07: qty 10

## 2021-01-07 MED ORDER — PALONOSETRON HCL INJECTION 0.25 MG/5ML
INTRAVENOUS | Status: AC
  Filled 2021-01-07: qty 5

## 2021-01-07 MED ORDER — HEPARIN SOD (PORK) LOCK FLUSH 100 UNIT/ML IV SOLN
500.0000 [IU] | Freq: Once | INTRAVENOUS | Status: AC | PRN
  Administered 2021-01-07: 500 [IU]
  Filled 2021-01-07: qty 5

## 2021-01-07 MED ORDER — PALONOSETRON HCL INJECTION 0.25 MG/5ML
0.2500 mg | Freq: Once | INTRAVENOUS | Status: AC
  Administered 2021-01-07: 0.25 mg via INTRAVENOUS

## 2021-01-07 MED ORDER — TRASTUZUMAB-ANNS CHEMO 150 MG IV SOLR
450.0000 mg | Freq: Once | INTRAVENOUS | Status: AC
  Administered 2021-01-07: 450 mg via INTRAVENOUS
  Filled 2021-01-07: qty 21.43

## 2021-01-07 MED ORDER — ACETAMINOPHEN 325 MG PO TABS
650.0000 mg | ORAL_TABLET | Freq: Once | ORAL | Status: AC
Start: 2021-01-07 — End: 2021-01-07
  Administered 2021-01-07: 650 mg via ORAL

## 2021-01-07 MED ORDER — DIPHENHYDRAMINE HCL 25 MG PO CAPS
25.0000 mg | ORAL_CAPSULE | Freq: Once | ORAL | Status: AC
  Administered 2021-01-07: 25 mg via ORAL

## 2021-01-07 MED ORDER — DIPHENHYDRAMINE HCL 25 MG PO CAPS
ORAL_CAPSULE | ORAL | Status: AC
  Filled 2021-01-07: qty 1

## 2021-01-07 MED ORDER — SODIUM CHLORIDE 0.9% FLUSH
10.0000 mL | INTRAVENOUS | Status: DC | PRN
  Administered 2021-01-07: 10 mL
  Filled 2021-01-07: qty 10

## 2021-01-07 MED ORDER — SODIUM CHLORIDE 0.9 % IV SOLN
10.0000 mg | Freq: Once | INTRAVENOUS | Status: AC
  Administered 2021-01-07: 10 mg via INTRAVENOUS
  Filled 2021-01-07: qty 10

## 2021-01-07 MED ORDER — SODIUM CHLORIDE 0.9 % IV SOLN
Freq: Once | INTRAVENOUS | Status: AC
  Filled 2021-01-07: qty 250

## 2021-01-07 MED ORDER — ACETAMINOPHEN 325 MG PO TABS
ORAL_TABLET | ORAL | Status: AC
  Filled 2021-01-07: qty 2

## 2021-01-07 NOTE — Patient Instructions (Addendum)
[facility]  Discharge Instructions: Thank you for choosing [facility] to provide your oncology and hematology care.   If you have a lab appointment with the Cancer Center, please go directly to the Cancer Center and check in at the registration area.   Wear comfortable clothing and clothing appropriate for easy access to any Portacath or PICC line.   We strive to give you quality time with your provider. You may need to reschedule your appointment if you arrive late (15 or more minutes).  Arriving late affects you and other patients whose appointments are after yours.  Also, if you miss three or more appointments without notifying the office, you may be dismissed from the clinic at the provider's discretion.      For prescription refill requests, have your pharmacy contact our office and allow 72 hours for refills to be completed.    Today you received the following chemotherapy and/or immunotherapy agents: Trastuzumab, Pertuzumab (Perjeta), Docetaxel (Taxotere), and Carboplatin.   To help prevent nausea and vomiting after your treatment, we encourage you to take your nausea medication as directed.  BELOW ARE SYMPTOMS THAT SHOULD BE REPORTED IMMEDIATELY: *FEVER GREATER THAN 100.4 F (38 C) OR HIGHER *CHILLS OR SWEATING *NAUSEA AND VOMITING THAT IS NOT CONTROLLED WITH YOUR NAUSEA MEDICATION *UNUSUAL SHORTNESS OF BREATH *UNUSUAL BRUISING OR BLEEDING *URINARY PROBLEMS (pain or burning when urinating, or frequent urination) *BOWEL PROBLEMS (unusual diarrhea, constipation, pain near the anus) TENDERNESS IN MOUTH AND THROAT WITH OR WITHOUT PRESENCE OF ULCERS (sore throat, sores in mouth, or a toothache) UNUSUAL RASH, SWELLING OR PAIN  UNUSUAL VAGINAL DISCHARGE OR ITCHING   Items with * indicate a potential emergency and should be followed up as soon as possible or go to the Emergency Department if any problems should occur.  Please show the  CHEMOTHERAPY ALERT CARD or IMMUNOTHERAPY ALERT CARD at check-in to the Emergency Department and triage nurse.  Should you have questions after your visit or need to cancel or reschedule your appointment, please contact [facility]  Dept: [phone]  and follow the prompts.  Office hours are 8:00 a.m. to 4:30 p.m. Monday - Friday. Please note that voicemails left after 4:00 p.m. may not be returned until the following business day.  We are closed weekends and major holidays. You have access to a nurse at all times for urgent questions. Please call the main number to the clinic Dept: [phone] and follow the prompts.   For any non-urgent questions, you may also contact your provider using MyChart. We now offer e-Visits for anyone 18 and older to request care online for non-urgent symptoms. For details visit [email or web address].   Also download the MyChart app! Go to the app store, search "MyChart", open the app, select [facility], and log in with your MyChart username and password.  Due to Covid, a mask is required upon entering the hospital/clinic. If you do not have a mask, one will be given to you upon arrival. For doctor visits, patients may have 1 support person aged 18 or older with them. For treatment visits, patients cannot have anyone with them due to current Covid guidelines and our immunocompromised population.   

## 2021-01-08 ENCOUNTER — Other Ambulatory Visit: Payer: Self-pay

## 2021-01-08 ENCOUNTER — Ambulatory Visit: Payer: Self-pay | Admitting: Hematology and Oncology

## 2021-01-08 ENCOUNTER — Telehealth: Payer: Self-pay | Admitting: Hematology and Oncology

## 2021-01-08 ENCOUNTER — Ambulatory Visit: Payer: Self-pay

## 2021-01-08 NOTE — Telephone Encounter (Signed)
Scheduled appointment per 05/26 los. Patient will receive updated calender.

## 2021-01-09 ENCOUNTER — Ambulatory Visit: Payer: Self-pay

## 2021-01-13 ENCOUNTER — Telehealth: Payer: Self-pay | Admitting: *Deleted

## 2021-01-13 NOTE — Telephone Encounter (Signed)
Received vm call from pt stating that she had an issue over w/e with ? Blood in her urine.  She reports drinking some organic red wine & noted several times that her urine looked a little pink.  She reports that there are no other symptoms of UTI.  Returned call & pt is not having a period.  Informed to continue to observe & especially if she has UTI symptoms to call back.  She may need U/A.  Could have been from the wine.  She expressed understanding. Reported to Dr Geralyn Flash RN/[place].

## 2021-01-28 ENCOUNTER — Encounter: Payer: Self-pay | Admitting: *Deleted

## 2021-01-28 NOTE — Assessment & Plan Note (Signed)
11/04/2020:Screening mammogram showed a possible left breast mass and enlarged axillary lymph nodes. Diagnostic mammogram and US showed a 1.4cm mass at the 3:30 position in the left breast and 2 abnormal lymph nodes in the left axilla. 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  Treatment Plan: 1.Neoadjuvant chemotherapy with TCH Perjeta 6 cycles followed by Herceptin Perjeta maintenance versus Kadcyla maintenance (based on response to neoadjuvant chemo) for 1 year 2. Followed by breast conserving surgery if possible withtargeted node dissection versus ALND 3. Followed by adjuvant radiation therapy 4.Consideration for neratinib ------------------------------------------------------------------------------------------------------------------------------- Current Treatment: Cycle4TCHP(started4/15/22) Chemo Toxicities: 1.Severe diarrhea: Diarrhea is now more manageable with probiotics and colestipol along with Metamucil. 2.anemia: Monitoring her hemoglobin today is 10.7 3.Alopecia  She did not have any nausea or fatigue issues after last chemo. RTC in 3 weeks for cycle 5 

## 2021-01-28 NOTE — Progress Notes (Signed)
We will  Patient Care Team: Alroy Dust, L.Marlou Sa, MD as PCP - General (Family Medicine) Jovita Kussmaul, MD as Consulting Physician (General Surgery) Nicholas Lose, MD as Consulting Physician (Hematology and Oncology) Gery Pray, MD as Consulting Physician (Radiation Oncology) Mauro Kaufmann, RN as Oncology Nurse Navigator Rockwell Germany, RN as Oncology Nurse Navigator  DIAGNOSIS:    ICD-10-CM   1. Malignant neoplasm of lower-outer quadrant of left breast of female, estrogen receptor negative ([place])  C50.512    Z17.1       SUMMARY OF ONCOLOGIC HISTORY: Oncology History  Malignant neoplasm of lower-outer quadrant of left breast of female, estrogen receptor negative ([place])  11/04/2020 Initial Diagnosis   Screening mammogram showed a possible left breast mass and enlarged axillary lymph nodes. Diagnostic mammogram and US showed a 1.4cm mass at the 3:30 position in the left breast and 2 abnormal lymph nodes in the left axilla. 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   11/11/2020 Cancer Staging   Staging form: Breast, AJCC 8th Edition - Clinical stage from 11/11/2020: Stage IIA (cT1c, cN1, cM0, G3, ER-, PR-, HER2+) - Signed by Nicholas Lose, MD on 11/11/2020  Stage prefix: Initial diagnosis  Histologic grading system: 3 grade system  Laterality: Left  Staged by: Pathologist and managing physician  Stage used in treatment planning: Yes  National guidelines used in treatment planning: Yes  Type of national guideline used in treatment planning: NCCN    11/27/2020 -  Chemotherapy    Patient is on Treatment Plan: BREAST  DOCETAXEL + CARBOPLATIN + TRASTUZUMAB + PERTUZUMAB  (TCHP) Q21D          CHIEF COMPLIANT: Cycle 4 TCH Perjeta  INTERVAL HISTORY: Kristina Harvey is a 55 y.o. with above-mentioned history of breast cancer currently on neoadjuvant chemotherapy with [facility]. She presents to  the clinic today for cycle 4.   ALLERGIES:  is allergic to caffeine.  MEDICATIONS:  Current Outpatient Medications  Medication Sig Dispense Refill   ALPRAZolam (XANAX) 0.25 MG tablet Take 0.25 mg by mouth at bedtime as needed for sleep.     colestipol (COLESTID) 1 g tablet Take 2 g by mouth at bedtime.     dexamethasone (DECADRON) 4 MG tablet Take 1 tablet (4 mg total) by mouth 2 (two) times daily. Take 1 tablet day before chemo and 1 tablet day after chemo with food 12 tablet 0   levothyroxine (SYNTHROID) 75 MCG tablet Take 75 mcg by mouth daily before breakfast.     lidocaine-prilocaine (EMLA) cream Apply to affected area once 30 g 3   metoprolol succinate (TOPROL-XL) 25 MG 24 hr tablet TAKE ONE TABLET BY MOUTH DAILY WITH OR IMMEDIATELY FOLLOWING A MEAL (Patient taking differently: Take 25 mg by mouth daily.) 90 tablet 1   ondansetron (ZOFRAN) 8 MG tablet Take 1 tablet (8 mg total) by mouth 2 (two) times daily as needed (Nausea or vomiting). Start on the third day after chemotherapy. 30 tablet 1   prochlorperazine (COMPAZINE) 10 MG tablet Take 1 tablet (10 mg total) by mouth every 6 (six) hours as needed (Nausea or vomiting). 30 tablet 1   No current facility-administered medications for this visit.    PHYSICAL EXAMINATION: ECOG PERFORMANCE STATUS: 1 - Symptomatic but completely ambulatory  Vitals:   01/29/21 0954  BP: 134/67  Pulse: 93  Resp: 18  Temp: 97.6 F (36.4 C)  SpO2: 99%  Filed Weights   01/29/21 0954  Weight: 161 lb 12.8 oz (73.4 kg)     LABORATORY DATA:  I have reviewed the data as listed CMP Latest Ref Rng & Units 01/07/2021 12/18/2020 12/03/2020  Glucose 70 - 99 mg/dL 94 105(H) 112(H)  BUN 6 - 20 mg/dL 10 13 10   Creatinine 0.44 - 1.00 mg/dL 0.60 0.67 0.73  Sodium 135 - 145 mmol/L 139 140 136  Potassium 3.5 - 5.1 mmol/L 3.6 3.6 4.5  Chloride 98 - 111 mmol/L 106 107 101  CO2 22 - 32 mmol/L 25 23 26   Calcium 8.9 - 10.3 mg/dL 9.4 9.3 9.1  Total Protein 6.5 -  8.1 g/dL 6.6 6.9 7.1  Total Bilirubin 0.3 - 1.2 mg/dL 0.6 0.3 0.5  Alkaline Phos 38 - 126 U/L 67 65 67  AST 15 - 41 U/L 16 16 18   ALT 0 - 44 U/L 18 16 17     Lab Results  Component Value Date   WBC 6.9 01/29/2021   HGB 11.3 (L) 01/29/2021   HCT 33.8 (L) 01/29/2021   MCV 102.1 (H) 01/29/2021   PLT 221 01/29/2021   NEUTROABS 5.7 01/29/2021    ASSESSMENT & PLAN:  Malignant neoplasm of lower-outer quadrant of left breast of female, estrogen receptor negative ([place]) 11/04/2020:Screening mammogram showed a possible left breast mass and enlarged axillary lymph nodes. Diagnostic mammogram and US showed a 1.4cm mass at the 3:30 position in the left breast and 2 abnormal lymph nodes in the left axilla. 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   Treatment Plan: 1. Neoadjuvant chemotherapy with TCH Perjeta 6 cycles followed by Herceptin Perjeta maintenance versus Kadcyla maintenance (based on response to neoadjuvant chemo) for 1 year 2. Followed by breast conserving surgery if possible with targeted node dissection versus ALND 3. Followed by adjuvant radiation therapy 4.  Consideration for neratinib ------------------------------------------------------------------------------------------------------------------------------- Current Treatment: Cycle 4 TCHP  (started 11/27/20) Chemo Toxicities: 1.  Severe diarrhea: Diarrhea is now more manageable with probiotics and colestipol along with Metamucil. 2. anemia: Monitoring her hemoglobin today is 11.3 3.  Alopecia   She is planning to take a beach vacation.  She will be July 4 at the beach at the beach till [place] 4th She did not have any nausea or fatigue issues after last chemo. RTC in 3 weeks for cycle 5    Orders Placed This Encounter  Procedures   CMP ([facility] only)   The patient has a good understanding of the overall plan. she agrees with it.  she will call with any problems that may develop before the next visit here.  Total time spent: 30 mins including face to face time and time spent for planning, charting and coordination of care  Rulon Eisenmenger, MD, MPH 01/29/2021  I, Thana Ates, am acting as scribe for Dr. Nicholas Lose.  I have reviewed the above documentation for accuracy and completeness, and I agree with the above.

## 2021-01-29 ENCOUNTER — Other Ambulatory Visit: Payer: Self-pay

## 2021-01-29 ENCOUNTER — Inpatient Hospital Stay (HOSPITAL_BASED_OUTPATIENT_CLINIC_OR_DEPARTMENT_OTHER): Payer: Managed Care, Other (non HMO) | Admitting: Hematology and Oncology

## 2021-01-29 ENCOUNTER — Inpatient Hospital Stay: Payer: Managed Care, Other (non HMO) | Attending: Hematology and Oncology

## 2021-01-29 ENCOUNTER — Inpatient Hospital Stay: Payer: Managed Care, Other (non HMO)

## 2021-01-29 DIAGNOSIS — Z5111 Encounter for antineoplastic chemotherapy: Secondary | ICD-10-CM | POA: Diagnosis present

## 2021-01-29 DIAGNOSIS — C50512 Malignant neoplasm of lower-outer quadrant of left female breast: Secondary | ICD-10-CM | POA: Insufficient documentation

## 2021-01-29 DIAGNOSIS — D649 Anemia, unspecified: Secondary | ICD-10-CM | POA: Insufficient documentation

## 2021-01-29 DIAGNOSIS — Z171 Estrogen receptor negative status [ER-]: Secondary | ICD-10-CM | POA: Diagnosis not present

## 2021-01-29 DIAGNOSIS — Z5112 Encounter for antineoplastic immunotherapy: Secondary | ICD-10-CM | POA: Diagnosis present

## 2021-01-29 DIAGNOSIS — Z95828 Presence of other vascular implants and grafts: Secondary | ICD-10-CM

## 2021-01-29 LAB — CBC WITH DIFFERENTIAL/PLATELET
Abs Immature Granulocytes: 0.03 10*3/uL (ref 0.00–0.07)
Basophils Absolute: 0 10*3/uL (ref 0.0–0.1)
Basophils Relative: 0 %
Eosinophils Absolute: 0 10*3/uL (ref 0.0–0.5)
Eosinophils Relative: 0 %
HCT: 33.8 % — ABNORMAL LOW (ref 36.0–46.0)
Hemoglobin: 11.3 g/dL — ABNORMAL LOW (ref 12.0–15.0)
Immature Granulocytes: 0 %
Lymphocytes Relative: 10 %
Lymphs Abs: 0.7 10*3/uL (ref 0.7–4.0)
MCH: 34.1 pg — ABNORMAL HIGH (ref 26.0–34.0)
MCHC: 33.4 g/dL (ref 30.0–36.0)
MCV: 102.1 fL — ABNORMAL HIGH (ref 80.0–100.0)
Monocytes Absolute: 0.5 10*3/uL (ref 0.1–1.0)
Monocytes Relative: 7 %
Neutro Abs: 5.7 10*3/uL (ref 1.7–7.7)
Neutrophils Relative %: 83 %
Platelets: 221 10*3/uL (ref 150–400)
RBC: 3.31 MIL/uL — ABNORMAL LOW (ref 3.87–5.11)
RDW: 15.4 % (ref 11.5–15.5)
WBC: 6.9 10*3/uL (ref 4.0–10.5)
nRBC: 0 % (ref 0.0–0.2)

## 2021-01-29 LAB — CMP (CANCER CENTER ONLY)
ALT: 19 U/L (ref 0–44)
AST: 20 U/L (ref 15–41)
Albumin: 3.9 g/dL (ref 3.5–5.0)
Alkaline Phosphatase: 63 U/L (ref 38–126)
Anion gap: 8 (ref 5–15)
BUN: 12 mg/dL (ref 6–20)
CO2: 25 mmol/L (ref 22–32)
Calcium: 9.5 mg/dL (ref 8.9–10.3)
Chloride: 104 mmol/L (ref 98–111)
Creatinine: 0.6 mg/dL (ref 0.44–1.00)
GFR, Estimated: >60 mL/min (ref >60)
Glucose, Bld: 139 mg/dL — ABNORMAL HIGH (ref 70–99)
Potassium: 4 mmol/L (ref 3.5–5.1)
Sodium: 137 mmol/L (ref 135–145)
Total Bilirubin: 0.4 mg/dL (ref 0.3–1.2)
Total Protein: 7 g/dL (ref 6.5–8.1)

## 2021-01-29 MED ORDER — SODIUM CHLORIDE 0.9 % IV SOLN
75.0000 mg/m2 | Freq: Once | INTRAVENOUS | Status: AC
  Administered 2021-01-29: 140 mg via INTRAVENOUS
  Filled 2021-01-29: qty 14

## 2021-01-29 MED ORDER — DIPHENHYDRAMINE HCL 25 MG PO CAPS
ORAL_CAPSULE | ORAL | Status: AC
  Filled 2021-01-29: qty 1

## 2021-01-29 MED ORDER — SODIUM CHLORIDE 0.9 % IV SOLN
10.0000 mg | Freq: Once | INTRAVENOUS | Status: AC
  Administered 2021-01-29: 10 mg via INTRAVENOUS
  Filled 2021-01-29: qty 10

## 2021-01-29 MED ORDER — ACETAMINOPHEN 325 MG PO TABS
650.0000 mg | ORAL_TABLET | Freq: Once | ORAL | Status: AC
  Administered 2021-01-29: 650 mg via ORAL

## 2021-01-29 MED ORDER — SODIUM CHLORIDE 0.9 % IV SOLN
420.0000 mg | Freq: Once | INTRAVENOUS | Status: AC
  Administered 2021-01-29: 420 mg via INTRAVENOUS
  Filled 2021-01-29: qty 14

## 2021-01-29 MED ORDER — DIPHENHYDRAMINE HCL 25 MG PO CAPS
25.0000 mg | ORAL_CAPSULE | Freq: Once | ORAL | Status: AC
  Administered 2021-01-29: 25 mg via ORAL

## 2021-01-29 MED ORDER — TRASTUZUMAB-ANNS CHEMO 150 MG IV SOLR
450.0000 mg | Freq: Once | INTRAVENOUS | Status: AC
  Administered 2021-01-29: 450 mg via INTRAVENOUS
  Filled 2021-01-29: qty 21.43

## 2021-01-29 MED ORDER — SODIUM CHLORIDE 0.9% FLUSH
10.0000 mL | INTRAVENOUS | Status: DC | PRN
  Administered 2021-01-29: 10 mL
  Filled 2021-01-29: qty 10

## 2021-01-29 MED ORDER — PALONOSETRON HCL INJECTION 0.25 MG/5ML
INTRAVENOUS | Status: AC
  Filled 2021-01-29: qty 5

## 2021-01-29 MED ORDER — SODIUM CHLORIDE 0.9 % IV SOLN
150.0000 mg | Freq: Once | INTRAVENOUS | Status: AC
  Administered 2021-01-29: 150 mg via INTRAVENOUS
  Filled 2021-01-29: qty 150

## 2021-01-29 MED ORDER — SODIUM CHLORIDE 0.9% FLUSH
10.0000 mL | Freq: Once | INTRAVENOUS | Status: AC
  Administered 2021-01-29: 10 mL
  Filled 2021-01-29: qty 10

## 2021-01-29 MED ORDER — PALONOSETRON HCL INJECTION 0.25 MG/5ML
0.2500 mg | Freq: Once | INTRAVENOUS | Status: AC
  Administered 2021-01-29: 0.25 mg via INTRAVENOUS

## 2021-01-29 MED ORDER — HEPARIN SOD (PORK) LOCK FLUSH 100 UNIT/ML IV SOLN
500.0000 [IU] | Freq: Once | INTRAVENOUS | Status: AC | PRN
Start: 2021-01-29 — End: 2021-01-29
  Administered 2021-01-29: 500 [IU]
  Filled 2021-01-29: qty 5

## 2021-01-29 MED ORDER — SODIUM CHLORIDE 0.9 % IV SOLN
Freq: Once | INTRAVENOUS | Status: AC
  Filled 2021-01-29: qty 250

## 2021-01-29 MED ORDER — SODIUM CHLORIDE 0.9 % IV SOLN
724.2000 mg | Freq: Once | INTRAVENOUS | Status: AC
  Administered 2021-01-29: 720 mg via INTRAVENOUS
  Filled 2021-01-29: qty 72

## 2021-01-29 MED ORDER — ACETAMINOPHEN 325 MG PO TABS
ORAL_TABLET | ORAL | Status: AC
  Filled 2021-01-29: qty 2

## 2021-01-29 NOTE — Patient Instructions (Signed)
[facility]   Discharge Instructions: Thank you for choosing [facility] to provide your oncology and hematology care.   If you have a lab appointment with the [facility], please go directly to the [facility] and check in at the registration area.   Wear comfortable clothing and clothing appropriate for easy access to any Portacath or PICC line.   We strive to give you quality time with your provider. You may need to reschedule your appointment if you arrive late (15 or more minutes).  Arriving late affects you and other patients whose appointments are after yours.  Also, if you miss three or more appointments without notifying the office, you may be dismissed from the clinic at the provider's discretion.      For prescription refill requests, have your pharmacy contact our office and allow 72 hours for refills to be completed.    Today you received the following chemotherapy and/or immunotherapy agents: trastuzumab, pertuzumab, docetaxel, and carboplatin.      To help prevent nausea and vomiting after your treatment, we encourage you to take your nausea medication as directed.  BELOW ARE SYMPTOMS THAT SHOULD BE REPORTED IMMEDIATELY: *FEVER GREATER THAN 100.4 F (38 C) OR HIGHER *CHILLS OR SWEATING *NAUSEA AND VOMITING THAT IS NOT CONTROLLED WITH YOUR NAUSEA MEDICATION *UNUSUAL SHORTNESS OF BREATH *UNUSUAL BRUISING OR BLEEDING *URINARY PROBLEMS (pain or burning when urinating, or frequent urination) *BOWEL PROBLEMS (unusual diarrhea, constipation, pain near the anus) TENDERNESS IN MOUTH AND THROAT WITH OR WITHOUT PRESENCE OF ULCERS (sore throat, sores in mouth, or a toothache) UNUSUAL RASH, SWELLING OR PAIN  UNUSUAL VAGINAL DISCHARGE OR ITCHING   Items with * indicate a potential emergency and should be followed up as soon as possible or go to the Emergency Department if any problems should occur.  Please show the CHEMOTHERAPY ALERT  CARD or IMMUNOTHERAPY ALERT CARD at check-in to the Emergency Department and triage nurse.  Should you have questions after your visit or need to cancel or reschedule your appointment, please contact [facility]  Dept: [phone]  and follow the prompts.  Office hours are 8:00 a.m. to 4:30 p.m. Monday - Friday. Please note that voicemails left after 4:00 p.m. may not be returned until the following business day.  We are closed weekends and major holidays. You have access to a nurse at all times for urgent questions. Please call the main number to the clinic Dept: [phone] and follow the prompts.   For any non-urgent questions, you may also contact your provider using MyChart. We now offer e-Visits for anyone 66 and older to request care online for non-urgent symptoms. For details visit [email or web address].   Also download the MyChart app! Go to the app store, search "MyChart", open the app, select [facility], and log in with your MyChart username and password.  Due to Covid, a mask is required upon entering the hospital/clinic. If you do not have a mask, one will be given to you upon arrival. For doctor visits, patients may have 1 support person aged 65 or older with them. For treatment visits, patients cannot have anyone with them due to current Covid guidelines and our immunocompromised population.

## 2021-02-01 ENCOUNTER — Ambulatory Visit: Payer: Self-pay

## 2021-02-09 ENCOUNTER — Institutional Professional Consult (permissible substitution): Payer: Managed Care, Other (non HMO) | Admitting: Plastic Surgery

## 2021-02-18 ENCOUNTER — Encounter: Payer: Self-pay | Admitting: Pharmacist

## 2021-02-18 NOTE — Progress Notes (Signed)
Patient Care Team: Alroy Dust, L.Marlou Sa, MD as PCP - General (Family Medicine) Jovita Kussmaul, MD as Consulting Physician (General Surgery) Nicholas Lose, MD as Consulting Physician (Hematology and Oncology) Gery Pray, MD as Consulting Physician (Radiation Oncology) Mauro Kaufmann, RN as Oncology Nurse Navigator Rockwell Germany, RN as Oncology Nurse Navigator  DIAGNOSIS:    ICD-10-CM   1. Malignant neoplasm of lower-outer quadrant of left breast of female, estrogen receptor negative ([place])  C50.512 ECHOCARDIOGRAM COMPLETE   Z17.1       SUMMARY OF ONCOLOGIC HISTORY: Oncology History  Malignant neoplasm of lower-outer quadrant of left breast of female, estrogen receptor negative ([place])  11/04/2020 Initial Diagnosis   Screening mammogram showed a possible left breast mass and enlarged axillary lymph nodes. Diagnostic mammogram and US showed a 1.4cm mass at the 3:30 position in the left breast and 2 abnormal lymph nodes in the left axilla. 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   11/11/2020 Cancer Staging   Staging form: Breast, AJCC 8th Edition - Clinical stage from 11/11/2020: Stage IIA (cT1c, cN1, cM0, G3, ER-, PR-, HER2+) - Signed by Nicholas Lose, MD on 11/11/2020  Stage prefix: Initial diagnosis  Histologic grading system: 3 grade system  Laterality: Left  Staged by: Pathologist and managing physician  Stage used in treatment planning: Yes  National guidelines used in treatment planning: Yes  Type of national guideline used in treatment planning: NCCN    11/27/2020 -  Chemotherapy    Patient is on Treatment Plan: BREAST  DOCETAXEL + CARBOPLATIN + TRASTUZUMAB + PERTUZUMAB  (TCHP) Q21D          CHIEF COMPLIANT:  Cycle 5 TCH Perjeta  INTERVAL HISTORY: Kristina Harvey is a 55 y.o. with above-mentioned history of breast cancer currently on neoadjuvant chemotherapy with [facility].  She presents to the clinic today for cycle 5.  She has had stable mild neuropathy in couple of toes.  The other complaint she had was that of maculopapular rash on her back.  She uses topical steroid and it seems to have helped.  Does not have any nausea or vomiting.  She does feel tired after treatment.  She spent a couple of weeks on the beach and had a wonderful time.  ALLERGIES:  is allergic to caffeine.  MEDICATIONS:  Current Outpatient Medications  Medication Sig Dispense Refill   ALPRAZolam (XANAX) 0.25 MG tablet Take 0.25 mg by mouth at bedtime as needed for sleep.     colestipol (COLESTID) 1 g tablet Take 2 g by mouth at bedtime.     dexamethasone (DECADRON) 4 MG tablet Take 1 tablet (4 mg total) by mouth 2 (two) times daily. Take 1 tablet day before chemo and 1 tablet day after chemo with food 12 tablet 0   levothyroxine (SYNTHROID) 75 MCG tablet Take 75 mcg by mouth daily before breakfast.     lidocaine-prilocaine (EMLA) cream Apply to affected area once 30 g 3   metoprolol succinate (TOPROL-XL) 25 MG 24 hr tablet TAKE ONE TABLET BY MOUTH DAILY WITH OR IMMEDIATELY FOLLOWING A MEAL (Patient taking differently: Take 25 mg by mouth daily.) 90 tablet 1   ondansetron (ZOFRAN) 8 MG tablet Take 1 tablet (8 mg total) by mouth 2 (two) times daily as needed (Nausea or vomiting). Start on the third day after chemotherapy. 30 tablet 1   prochlorperazine (COMPAZINE) 10 MG tablet Take 1 tablet (10  mg total) by mouth every 6 (six) hours as needed (Nausea or vomiting). 30 tablet 1   No current facility-administered medications for this visit.    PHYSICAL EXAMINATION: ECOG PERFORMANCE STATUS: 1 - Symptomatic but completely ambulatory  Vitals:   02/19/21 1051  BP: 133/63  Pulse: 93  Resp: 18  Temp: 97.7 F (36.5 C)  SpO2: 100%   Filed Weights   02/19/21 1051  Weight: 163 lb (73.9 kg)    LABORATORY DATA:  I have reviewed the data as listed CMP Latest Ref Rng & Units 02/19/2021 01/29/2021  01/07/2021  Glucose 70 - 99 mg/dL 89 139(H) 94  BUN 6 - 20 mg/dL 17 12 10   Creatinine 0.44 - 1.00 mg/dL 0.61 0.60 0.60  Sodium 135 - 145 mmol/L 139 137 139  Potassium 3.5 - 5.1 mmol/L 3.8 4.0 3.6  Chloride 98 - 111 mmol/L 105 104 106  CO2 22 - 32 mmol/L 27 25 25   Calcium 8.9 - 10.3 mg/dL 9.1 9.5 9.4  Total Protein 6.5 - 8.1 g/dL 6.8 7.0 6.6  Total Bilirubin 0.3 - 1.2 mg/dL 0.5 0.4 0.6  Alkaline Phos 38 - 126 U/L 66 63 67  AST 15 - 41 U/L 19 20 16   ALT 0 - 44 U/L 17 19 18     Lab Results  Component Value Date   WBC 10.7 (H) 02/19/2021   HGB 10.4 (L) 02/19/2021   HCT 31.1 (L) 02/19/2021   MCV 105.4 (H) 02/19/2021   PLT 174 02/19/2021   NEUTROABS 6.5 02/19/2021    ASSESSMENT & PLAN:  Malignant neoplasm of lower-outer quadrant of left 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 Diagnostic mammogram and US showed a 1.4cm mass at the 3:30 position in the left breast and 2 abnormal lymph nodes in the left axilla. 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   Treatment Plan: 1. Neoadjuvant chemotherapy with TCH Perjeta 6 cycles followed by Herceptin Perjeta maintenance versus Kadcyla maintenance (based on response to neoadjuvant chemo) for 1 year 2. Followed by breast conserving surgery if possible with targeted node dissection versus ALND 3. Followed by adjuvant radiation therapy 4.  Consideration for neratinib ------------------------------------------------------------------------------------------------------------------------------- Current Treatment: Cycle 5 TCHP  (started 11/27/20)  Chemo Toxicities: 1.  Severe diarrhea: Diarrhea is now more manageable with probiotics and colestipol along with Metamucil. 2. anemia: Monitoring her hemoglobin today is 11.3 3.  Alopecia   She did not have  any nausea or fatigue issues after last chemo. She has a breast MRI scheduled for August 10.  I will request Dr. Marlou Starks to see her after the MRI.  She has an appointment to see Dr. Elisabeth Cara on 02/23/2021. RTC in 3 weeks for cycle 6    Orders Placed This Encounter  Procedures   ECHOCARDIOGRAM COMPLETE    Standing Status:   Future    Standing Expiration Date:   02/18/2022    Scheduling Instructions:     Please contact patient with date of exam once scheduled    Order Specific Question:   Where should this test be performed    Answer:   [facility]    Order Specific Question:   Perflutren DEFINITY (image enhancing agent) should be administered unless hypersensitivity or allergy exist    Answer:   Administer Perflutren    Order Specific Question:   Is a special  reader required? (athlete or structural heart)    Answer:   No    Order Specific Question:   Does this study need to be read by the Structural team/Level 3 readers?    Answer:   No    Order Specific Question:   Reason for exam-Echo    Answer:   Chemo  Z09    Order Specific Question:   Release to patient    Answer:   Immediate   The patient has a good understanding of the overall plan. she agrees with it. she will call with any problems that may develop before the next visit here.  Total time spent: 30 mins including face to face time and time spent for planning, charting and coordination of care  Rulon Eisenmenger, MD, MPH 02/19/2021  I, Thana Ates, am acting as scribe for Dr. Nicholas Lose.  I have reviewed the above documentation for accuracy and completeness, and I agree with the above.

## 2021-02-18 NOTE — Progress Notes (Signed)
  Oncology Clinical Pharmacist Practitioner Note  Kristina Harvey is a 55 y.o. female with a diagnosis of breast cancer currently on TCHP under the care of Dr. Nicholas Lose. She is scheduled to see Dr. Lindi Adie tomorrow followed by cycle 5 of TCHP. Upon clinical review today, identified that last ECHO was 11/25/20. Reviewed with Dr. Lindi Adie and instructed to place order for ECHO to be scheduled within the next week.  These orders have been placed with instructions for scheduling to reach out to patient with time and date of procedure once known.    Clinical pharmacy will continue to support [name]  and Dr. Lindi Adie as needed.  Raina Mina, RPH-CPP,  02/18/2021  1:01 PM

## 2021-02-19 ENCOUNTER — Other Ambulatory Visit: Payer: Self-pay

## 2021-02-19 ENCOUNTER — Inpatient Hospital Stay: Payer: Managed Care, Other (non HMO) | Attending: Hematology and Oncology

## 2021-02-19 ENCOUNTER — Inpatient Hospital Stay: Payer: Managed Care, Other (non HMO)

## 2021-02-19 ENCOUNTER — Encounter: Payer: Self-pay | Admitting: *Deleted

## 2021-02-19 ENCOUNTER — Inpatient Hospital Stay (HOSPITAL_BASED_OUTPATIENT_CLINIC_OR_DEPARTMENT_OTHER): Payer: Managed Care, Other (non HMO) | Admitting: Hematology and Oncology

## 2021-02-19 VITALS — BP 133/63 | HR 93 | Temp 97.7°F | Resp 18 | Ht 67.5 in | Wt 163.0 lb

## 2021-02-19 DIAGNOSIS — Z800 Family history of malignant neoplasm of digestive organs: Secondary | ICD-10-CM | POA: Diagnosis not present

## 2021-02-19 DIAGNOSIS — E785 Hyperlipidemia, unspecified: Secondary | ICD-10-CM | POA: Diagnosis not present

## 2021-02-19 DIAGNOSIS — D649 Anemia, unspecified: Secondary | ICD-10-CM | POA: Insufficient documentation

## 2021-02-19 DIAGNOSIS — C50512 Malignant neoplasm of lower-outer quadrant of left female breast: Secondary | ICD-10-CM

## 2021-02-19 DIAGNOSIS — Z79899 Other long term (current) drug therapy: Secondary | ICD-10-CM | POA: Diagnosis not present

## 2021-02-19 DIAGNOSIS — Z171 Estrogen receptor negative status [ER-]: Secondary | ICD-10-CM | POA: Diagnosis not present

## 2021-02-19 DIAGNOSIS — Z803 Family history of malignant neoplasm of breast: Secondary | ICD-10-CM | POA: Insufficient documentation

## 2021-02-19 DIAGNOSIS — E079 Disorder of thyroid, unspecified: Secondary | ICD-10-CM | POA: Insufficient documentation

## 2021-02-19 DIAGNOSIS — Z5111 Encounter for antineoplastic chemotherapy: Secondary | ICD-10-CM | POA: Insufficient documentation

## 2021-02-19 DIAGNOSIS — Z5112 Encounter for antineoplastic immunotherapy: Secondary | ICD-10-CM | POA: Diagnosis not present

## 2021-02-19 DIAGNOSIS — Z95828 Presence of other vascular implants and grafts: Secondary | ICD-10-CM

## 2021-02-19 DIAGNOSIS — Z8042 Family history of malignant neoplasm of prostate: Secondary | ICD-10-CM | POA: Insufficient documentation

## 2021-02-19 LAB — CBC WITH DIFFERENTIAL/PLATELET
Abs Immature Granulocytes: 0.03 10*3/uL (ref 0.00–0.07)
Basophils Absolute: 0 10*3/uL (ref 0.0–0.1)
Basophils Relative: 0 %
Eosinophils Absolute: 0 10*3/uL (ref 0.0–0.5)
Eosinophils Relative: 0 %
HCT: 31.1 % — ABNORMAL LOW (ref 36.0–46.0)
Hemoglobin: 10.4 g/dL — ABNORMAL LOW (ref 12.0–15.0)
Immature Granulocytes: 0 %
Lymphocytes Relative: 24 %
Lymphs Abs: 2.6 10*3/uL (ref 0.7–4.0)
MCH: 35.3 pg — ABNORMAL HIGH (ref 26.0–34.0)
MCHC: 33.4 g/dL (ref 30.0–36.0)
MCV: 105.4 fL — ABNORMAL HIGH (ref 80.0–100.0)
Monocytes Absolute: 1.6 10*3/uL — ABNORMAL HIGH (ref 0.1–1.0)
Monocytes Relative: 15 %
Neutro Abs: 6.5 10*3/uL (ref 1.7–7.7)
Neutrophils Relative %: 61 %
Platelets: 174 10*3/uL (ref 150–400)
RBC: 2.95 MIL/uL — ABNORMAL LOW (ref 3.87–5.11)
RDW: 15.8 % — ABNORMAL HIGH (ref 11.5–15.5)
WBC: 10.7 10*3/uL — ABNORMAL HIGH (ref 4.0–10.5)
nRBC: 0 % (ref 0.0–0.2)

## 2021-02-19 LAB — COMPREHENSIVE METABOLIC PANEL
ALT: 17 U/L (ref 0–44)
AST: 19 U/L (ref 15–41)
Albumin: 3.4 g/dL — ABNORMAL LOW (ref 3.5–5.0)
Alkaline Phosphatase: 66 U/L (ref 38–126)
Anion gap: 7 (ref 5–15)
BUN: 17 mg/dL (ref 6–20)
CO2: 27 mmol/L (ref 22–32)
Calcium: 9.1 mg/dL (ref 8.9–10.3)
Chloride: 105 mmol/L (ref 98–111)
Creatinine, Ser: 0.61 mg/dL (ref 0.44–1.00)
GFR, Estimated: >60 mL/min (ref >60)
Glucose, Bld: 89 mg/dL (ref 70–99)
Potassium: 3.8 mmol/L (ref 3.5–5.1)
Sodium: 139 mmol/L (ref 135–145)
Total Bilirubin: 0.5 mg/dL (ref 0.3–1.2)
Total Protein: 6.8 g/dL (ref 6.5–8.1)

## 2021-02-19 MED ORDER — PALONOSETRON HCL INJECTION 0.25 MG/5ML
0.2500 mg | Freq: Once | INTRAVENOUS | Status: AC
  Administered 2021-02-19: 0.25 mg via INTRAVENOUS

## 2021-02-19 MED ORDER — HEPARIN SOD (PORK) LOCK FLUSH 100 UNIT/ML IV SOLN
500.0000 [IU] | Freq: Once | INTRAVENOUS | Status: AC | PRN
  Administered 2021-02-19: 500 [IU]
  Filled 2021-02-19: qty 5

## 2021-02-19 MED ORDER — SODIUM CHLORIDE 0.9 % IV SOLN
10.0000 mg | Freq: Once | INTRAVENOUS | Status: AC
  Administered 2021-02-19: 10 mg via INTRAVENOUS
  Filled 2021-02-19: qty 10

## 2021-02-19 MED ORDER — SODIUM CHLORIDE 0.9 % IV SOLN
724.2000 mg | Freq: Once | INTRAVENOUS | Status: AC
  Administered 2021-02-19: 720 mg via INTRAVENOUS
  Filled 2021-02-19: qty 72

## 2021-02-19 MED ORDER — SODIUM CHLORIDE 0.9 % IV SOLN
150.0000 mg | Freq: Once | INTRAVENOUS | Status: AC
  Administered 2021-02-19: 150 mg via INTRAVENOUS
  Filled 2021-02-19: qty 150

## 2021-02-19 MED ORDER — ACETAMINOPHEN 325 MG PO TABS
ORAL_TABLET | ORAL | Status: AC
  Filled 2021-02-19: qty 2

## 2021-02-19 MED ORDER — PALONOSETRON HCL INJECTION 0.25 MG/5ML
INTRAVENOUS | Status: AC
  Filled 2021-02-19: qty 5

## 2021-02-19 MED ORDER — SODIUM CHLORIDE 0.9% FLUSH
10.0000 mL | INTRAVENOUS | Status: DC | PRN
  Administered 2021-02-19: 10 mL
  Filled 2021-02-19: qty 10

## 2021-02-19 MED ORDER — ACETAMINOPHEN 325 MG PO TABS
650.0000 mg | ORAL_TABLET | Freq: Once | ORAL | Status: AC
  Administered 2021-02-19: 650 mg via ORAL

## 2021-02-19 MED ORDER — TRASTUZUMAB-ANNS CHEMO 150 MG IV SOLR
6.0000 mg/kg | Freq: Once | INTRAVENOUS | Status: AC
  Administered 2021-02-19: 462 mg via INTRAVENOUS
  Filled 2021-02-19: qty 22

## 2021-02-19 MED ORDER — SODIUM CHLORIDE 0.9% FLUSH
10.0000 mL | Freq: Once | INTRAVENOUS | Status: AC
Start: 2021-02-19 — End: 2021-02-19
  Administered 2021-02-19: 10 mL
  Filled 2021-02-19: qty 10

## 2021-02-19 MED ORDER — SODIUM CHLORIDE 0.9 % IV SOLN
75.0000 mg/m2 | Freq: Once | INTRAVENOUS | Status: AC
  Administered 2021-02-19: 140 mg via INTRAVENOUS
  Filled 2021-02-19: qty 14

## 2021-02-19 MED ORDER — SODIUM CHLORIDE 0.9 % IV SOLN
Freq: Once | INTRAVENOUS | Status: AC
  Filled 2021-02-19: qty 250

## 2021-02-19 MED ORDER — DIPHENHYDRAMINE HCL 25 MG PO CAPS
25.0000 mg | ORAL_CAPSULE | Freq: Once | ORAL | Status: AC
  Administered 2021-02-19: 25 mg via ORAL

## 2021-02-19 MED ORDER — DIPHENHYDRAMINE HCL 25 MG PO CAPS
ORAL_CAPSULE | ORAL | Status: AC
  Filled 2021-02-19: qty 1

## 2021-02-19 MED ORDER — SODIUM CHLORIDE 0.9 % IV SOLN
420.0000 mg | Freq: Once | INTRAVENOUS | Status: AC
  Administered 2021-02-19: 420 mg via INTRAVENOUS
  Filled 2021-02-19: qty 14

## 2021-02-19 NOTE — Patient Instructions (Signed)
[facility]  Discharge Instructions: Thank you for choosing [facility] to provide your oncology and hematology care.   If you have a lab appointment with the [facility], please go directly to the [facility] and check in at the registration area.   Wear comfortable clothing and clothing appropriate for easy access to any Portacath or PICC line.   We strive to give you quality time with your provider. You may need to reschedule your appointment if you arrive late (15 or more minutes).  Arriving late affects you and other patients whose appointments are after yours.  Also, if you miss three or more appointments without notifying the office, you may be dismissed from the clinic at the provider's discretion.      For prescription refill requests, have your pharmacy contact our office and allow 72 hours for refills to be completed.    Today you received the following chemotherapy and/or immunotherapy agents: trastuzumab/pertuzumab/docetaxel/carboplatin.      To help prevent nausea and vomiting after your treatment, we encourage you to take your nausea medication as directed.  BELOW ARE SYMPTOMS THAT SHOULD BE REPORTED IMMEDIATELY: *FEVER GREATER THAN 100.4 F (38 C) OR HIGHER *CHILLS OR SWEATING *NAUSEA AND VOMITING THAT IS NOT CONTROLLED WITH YOUR NAUSEA MEDICATION *UNUSUAL SHORTNESS OF BREATH *UNUSUAL BRUISING OR BLEEDING *URINARY PROBLEMS (pain or burning when urinating, or frequent urination) *BOWEL PROBLEMS (unusual diarrhea, constipation, pain near the anus) TENDERNESS IN MOUTH AND THROAT WITH OR WITHOUT PRESENCE OF ULCERS (sore throat, sores in mouth, or a toothache) UNUSUAL RASH, SWELLING OR PAIN  UNUSUAL VAGINAL DISCHARGE OR ITCHING   Items with * indicate a potential emergency and should be followed up as soon as possible or go to the Emergency Department if any problems should occur.  Please show the CHEMOTHERAPY ALERT CARD or  IMMUNOTHERAPY ALERT CARD at check-in to the Emergency Department and triage nurse.  Should you have questions after your visit or need to cancel or reschedule your appointment, please contact [facility]  Dept: [phone]  and follow the prompts.  Office hours are 8:00 a.m. to 4:30 p.m. Monday - Friday. Please note that voicemails left after 4:00 p.m. may not be returned until the following business day.  We are closed weekends and major holidays. You have access to a nurse at all times for urgent questions. Please call the main number to the clinic Dept: [phone] and follow the prompts.   For any non-urgent questions, you may also contact your provider using MyChart. We now offer e-Visits for anyone 52 and older to request care online for non-urgent symptoms. For details visit [email or web address].   Also download the MyChart app! Go to the app store, search "MyChart", open the app, select [facility], and log in with your MyChart username and password.  Due to Covid, a mask is required upon entering the hospital/clinic. If you do not have a mask, one will be given to you upon arrival. For doctor visits, patients may have 1 support person aged 82 or older with them. For treatment visits, patients cannot have anyone with them due to current Covid guidelines and our immunocompromised population.

## 2021-02-19 NOTE — Assessment & Plan Note (Signed)
11/04/2020:Screening mammogram showed a possible left breast mass and enlarged axillary lymph nodes. Diagnostic mammogram and US showed a 1.4cm mass at the 3:30 position in the left breast and 2 abnormal lymph nodes in the left axilla. 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  Treatment Plan: 1.Neoadjuvant chemotherapy with TCH Perjeta 6 cycles followed by Herceptin Perjeta maintenance versus Kadcyla maintenance (based on response to neoadjuvant chemo) for 1 year 2. Followed by breast conserving surgery if possible withtargeted node dissection versus ALND 3. Followed by adjuvant radiation therapy 4.Consideration for neratinib ------------------------------------------------------------------------------------------------------------------------------- Current Treatment: Cycle5TCHP(started4/15/22)  Chemo Toxicities: 1.Severe diarrhea:Diarrhea is now more manageable with probiotics and colestipol along with Metamucil. 2.anemia: Monitoring her hemoglobintoday is 11.3 3.Alopecia  She enjoyed a vacation to the beach for July 4 weekend She did not have any nausea or fatigue issues after last chemo. RTC in 3 weeks for cycle6

## 2021-02-22 ENCOUNTER — Ambulatory Visit: Payer: Self-pay

## 2021-02-23 ENCOUNTER — Encounter: Payer: Self-pay | Admitting: Plastic Surgery

## 2021-02-23 ENCOUNTER — Other Ambulatory Visit: Payer: Self-pay

## 2021-02-23 ENCOUNTER — Telehealth: Payer: Self-pay | Admitting: *Deleted

## 2021-02-23 ENCOUNTER — Encounter: Payer: Self-pay | Admitting: Hematology and Oncology

## 2021-02-23 ENCOUNTER — Ambulatory Visit (INDEPENDENT_AMBULATORY_CARE_PROVIDER_SITE_OTHER): Payer: Managed Care, Other (non HMO) | Admitting: Plastic Surgery

## 2021-02-23 VITALS — BP 107/69 | HR 95 | Ht 67.5 in | Wt 161.0 lb

## 2021-02-23 DIAGNOSIS — Z171 Estrogen receptor negative status [ER-]: Secondary | ICD-10-CM

## 2021-02-23 DIAGNOSIS — C50512 Malignant neoplasm of lower-outer quadrant of left female breast: Secondary | ICD-10-CM | POA: Diagnosis not present

## 2021-02-23 NOTE — Progress Notes (Signed)
Patient ID: Kristina Harvey, female    DOB: [birth year], 55 y.o.   MRN: [id number]   Chief Complaint  Patient presents with   Advice Only    Patient is a 55 year old female here for consultation for breast reconstruction.  She had a routine mammogram and was found to have a irregularity in the left breast.  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  It is estrogen and progesterone negative and HER2 equivocal with a Ki-67 of 20%.  The mass is at the 3 o'clock position of the left breast and is 1.4 cm.  She is currently getting chemotherapy and finishes at the end of the month.  She has an appointment with Dr. Marlou Starks in August and is seeing Dr. Sonny Dandy.  She is 5 feet 7 inches tall and weighs 161 pounds.  Her preop bra size is a D cup.  She is wanting to have a partial mastectomy with oncoplastic reduction.   Review of Systems  Constitutional: Negative.   HENT: Negative.    Eyes: Negative.   Respiratory: Negative.    Cardiovascular: Negative.   Gastrointestinal: Negative.   Endocrine: Negative.   Genitourinary: Negative.   Neurological: Negative.   Hematological: Negative.   Psychiatric/Behavioral: Negative.     Past Medical History:  Diagnosis Date   Arrhythmia    Family history of breast cancer    Family history of prostate cancer    Family history of stomach cancer    Hyperlipidemia    Thyroid disease     Past Surgical History:  Procedure Laterality Date   chin implant  1996   IR IMAGING GUIDED PORT INSERTION  11/24/2020      Current Outpatient Medications:    ALPRAZolam (XANAX) 0.25 MG tablet, Take 0.25 mg by mouth at bedtime as needed for sleep., Disp: , Rfl:    colestipol (COLESTID) 1 g tablet, Take 2 g by mouth at bedtime., Disp: , Rfl:    levothyroxine (SYNTHROID) 75 MCG tablet, Take 75 mcg by mouth daily before breakfast., Disp: , Rfl:    lidocaine-prilocaine (EMLA) cream, Apply to affected area once,  Disp: 30 g, Rfl: 3   metoprolol succinate (TOPROL-XL) 25 MG 24 hr tablet, TAKE ONE TABLET BY MOUTH DAILY WITH OR IMMEDIATELY FOLLOWING A MEAL (Patient taking differently: Take 25 mg by mouth daily.), Disp: 90 tablet, Rfl: 1   ondansetron (ZOFRAN) 8 MG tablet, Take 1 tablet (8 mg total) by mouth 2 (two) times daily as needed (Nausea or vomiting). Start on the third day after chemotherapy., Disp: 30 tablet, Rfl: 1   prochlorperazine (COMPAZINE) 10 MG tablet, Take 1 tablet (10 mg total) by mouth every 6 (six) hours as needed (Nausea or vomiting)., Disp: 30 tablet, Rfl: 1   Objective:   Vitals:   02/23/21 1528  BP: 107/69  Pulse: 95  SpO2: 97%    Physical Exam Vitals and nursing note reviewed.  Constitutional:      Appearance: Normal appearance.  Cardiovascular:     Rate and Rhythm: Normal rate.     Pulses: Normal pulses.  Pulmonary:     Effort: Pulmonary effort is normal. No respiratory distress.  Abdominal:     General: Abdomen is flat. There is no distension.  Musculoskeletal:     Cervical back: Normal range of motion.  Skin:    General: Skin is warm.     Coloration: Skin is not jaundiced.  Findings: No bruising.  Neurological:     General: No focal deficit present.     Mental Status: She is alert and oriented to person, place, and time.    Assessment & Plan:  Malignant neoplasm of lower-outer quadrant of left breast of female, estrogen receptor negative ([place])  We discussed the options for breast reconstruction if she underwent a complete mastectomy.  These include autologous and implant based reconstruction.  She is wanting to have a partial mastectomy of the left breast and oncoplastic breast reduction bilaterally.  The procedure the patient selected and that was best for the patient was discussed. The risk were discussed and include but not limited to the following:  Breast asymmetry, fluid accumulation, firmness of the breast, inability to breast feed, loss of nipple or  areola, skin loss, change in skin and nipple sensation, fat necrosis of the breast tissue, bleeding, infection and healing delay.  There are risks of anesthesia and injury to nerves or blood vessels.  Any of these can lead to the need for revisional surgery.       Total time: 45 minutes. This includes time spent with the patient during the visit as well as time spent before and after the visit reviewing the chart, documenting the encounter and ordering pertinent studies. and literature emailed to the patient.    Plan for surgery with Dr. Marlou Starks for oncoplastic bilateral breast reduction.  Pictures were obtained of the patient and placed in the chart with the patient's or guardian's permission.   [place], DO

## 2021-02-23 NOTE — Telephone Encounter (Signed)
Notified that Dr Lindi Adie was [place] with ordering an [place] as well. Order placed

## 2021-03-08 ENCOUNTER — Other Ambulatory Visit: Payer: Self-pay

## 2021-03-08 ENCOUNTER — Ambulatory Visit (HOSPITAL_COMMUNITY)
Admission: RE | Admit: 2021-03-08 | Discharge: 2021-03-08 | Disposition: A | Discharge status: Home / Self Care | Payer: Managed Care, Other (non HMO) | Source: Ambulatory Visit | Attending: Hematology and Oncology | Admitting: Hematology and Oncology

## 2021-03-08 DIAGNOSIS — E785 Hyperlipidemia, unspecified: Secondary | ICD-10-CM | POA: Insufficient documentation

## 2021-03-08 DIAGNOSIS — Z01818 Encounter for other preprocedural examination: Secondary | ICD-10-CM | POA: Diagnosis not present

## 2021-03-08 DIAGNOSIS — Z171 Estrogen receptor negative status [ER-]: Secondary | ICD-10-CM | POA: Insufficient documentation

## 2021-03-08 DIAGNOSIS — C50512 Malignant neoplasm of lower-outer quadrant of left female breast: Secondary | ICD-10-CM | POA: Diagnosis not present

## 2021-03-08 DIAGNOSIS — Z0189 Encounter for other specified special examinations: Secondary | ICD-10-CM | POA: Diagnosis not present

## 2021-03-08 LAB — ECHOCARDIOGRAM COMPLETE
Area-P 1/2: 4.44 cm2
Calc EF: 60.8 %
S' Lateral: 3.1 cm
Single Plane A2C EF: 63.4 %
Single Plane A4C EF: 59.2 %

## 2021-03-08 NOTE — Progress Notes (Signed)
  Echocardiogram 2D Echocardiogram has been performed.  Kristina Harvey 03/08/2021, 9:50 AM

## 2021-03-11 ENCOUNTER — Other Ambulatory Visit: Payer: Self-pay | Admitting: Hematology and Oncology

## 2021-03-12 ENCOUNTER — Inpatient Hospital Stay (HOSPITAL_BASED_OUTPATIENT_CLINIC_OR_DEPARTMENT_OTHER): Payer: Managed Care, Other (non HMO) | Admitting: Adult Health

## 2021-03-12 ENCOUNTER — Encounter: Payer: Self-pay | Admitting: Adult Health

## 2021-03-12 ENCOUNTER — Encounter: Payer: Self-pay | Admitting: *Deleted

## 2021-03-12 ENCOUNTER — Inpatient Hospital Stay: Payer: Managed Care, Other (non HMO)

## 2021-03-12 ENCOUNTER — Other Ambulatory Visit: Payer: Self-pay

## 2021-03-12 VITALS — BP 151/64 | HR 100 | Temp 97.7°F | Resp 18 | Ht 67.5 in | Wt 162.0 lb

## 2021-03-12 DIAGNOSIS — Z171 Estrogen receptor negative status [ER-]: Secondary | ICD-10-CM | POA: Diagnosis not present

## 2021-03-12 DIAGNOSIS — C50512 Malignant neoplasm of lower-outer quadrant of left female breast: Secondary | ICD-10-CM

## 2021-03-12 DIAGNOSIS — Z5112 Encounter for antineoplastic immunotherapy: Secondary | ICD-10-CM | POA: Diagnosis not present

## 2021-03-12 DIAGNOSIS — Z95828 Presence of other vascular implants and grafts: Secondary | ICD-10-CM

## 2021-03-12 LAB — COMPREHENSIVE METABOLIC PANEL
ALT: 15 U/L (ref 0–44)
AST: 17 U/L (ref 15–41)
Albumin: 3.5 g/dL (ref 3.5–5.0)
Alkaline Phosphatase: 72 U/L (ref 38–126)
Anion gap: 9 (ref 5–15)
BUN: 9 mg/dL (ref 6–20)
CO2: 24 mmol/L (ref 22–32)
Calcium: 9 mg/dL (ref 8.9–10.3)
Chloride: 107 mmol/L (ref 98–111)
Creatinine, Ser: 0.62 mg/dL (ref 0.44–1.00)
GFR, Estimated: >60 mL/min (ref >60)
Glucose, Bld: 151 mg/dL — ABNORMAL HIGH (ref 70–99)
Potassium: 3.6 mmol/L (ref 3.5–5.1)
Sodium: 140 mmol/L (ref 135–145)
Total Bilirubin: 0.3 mg/dL (ref 0.3–1.2)
Total Protein: 6.6 g/dL (ref 6.5–8.1)

## 2021-03-12 LAB — CBC WITH DIFFERENTIAL/PLATELET
Abs Immature Granulocytes: 0.01 10*3/uL (ref 0.00–0.07)
Basophils Absolute: 0 10*3/uL (ref 0.0–0.1)
Basophils Relative: 0 %
Eosinophils Absolute: 0 10*3/uL (ref 0.0–0.5)
Eosinophils Relative: 0 %
HCT: 30.3 % — ABNORMAL LOW (ref 36.0–46.0)
Hemoglobin: 10 g/dL — ABNORMAL LOW (ref 12.0–15.0)
Immature Granulocytes: 0 %
Lymphocytes Relative: 11 %
Lymphs Abs: 0.6 10*3/uL — ABNORMAL LOW (ref 0.7–4.0)
MCH: 35.6 pg — ABNORMAL HIGH (ref 26.0–34.0)
MCHC: 33 g/dL (ref 30.0–36.0)
MCV: 107.8 fL — ABNORMAL HIGH (ref 80.0–100.0)
Monocytes Absolute: 0.4 10*3/uL (ref 0.1–1.0)
Monocytes Relative: 7 %
Neutro Abs: 5 10*3/uL (ref 1.7–7.7)
Neutrophils Relative %: 82 %
Platelets: 202 10*3/uL (ref 150–400)
RBC: 2.81 MIL/uL — ABNORMAL LOW (ref 3.87–5.11)
RDW: 14.6 % (ref 11.5–15.5)
WBC: 6.1 10*3/uL (ref 4.0–10.5)
nRBC: 0.3 % — ABNORMAL HIGH (ref 0.0–0.2)

## 2021-03-12 MED ORDER — SODIUM CHLORIDE 0.9 % IV SOLN
Freq: Once | INTRAVENOUS | Status: AC
  Filled 2021-03-12: qty 250

## 2021-03-12 MED ORDER — TRASTUZUMAB-ANNS CHEMO 150 MG IV SOLR
6.0000 mg/kg | Freq: Once | INTRAVENOUS | Status: AC
  Administered 2021-03-12: 462 mg via INTRAVENOUS
  Filled 2021-03-12: qty 22

## 2021-03-12 MED ORDER — SODIUM CHLORIDE 0.9 % IV SOLN
150.0000 mg | Freq: Once | INTRAVENOUS | Status: AC
  Administered 2021-03-12: 150 mg via INTRAVENOUS
  Filled 2021-03-12: qty 150

## 2021-03-12 MED ORDER — PALONOSETRON HCL INJECTION 0.25 MG/5ML
0.2500 mg | Freq: Once | INTRAVENOUS | Status: AC
  Administered 2021-03-12: 0.25 mg via INTRAVENOUS

## 2021-03-12 MED ORDER — PALONOSETRON HCL INJECTION 0.25 MG/5ML
INTRAVENOUS | Status: AC
  Filled 2021-03-12: qty 5

## 2021-03-12 MED ORDER — DIPHENHYDRAMINE HCL 25 MG PO CAPS
ORAL_CAPSULE | ORAL | Status: AC
  Filled 2021-03-12: qty 1

## 2021-03-12 MED ORDER — SODIUM CHLORIDE 0.9 % IV SOLN
420.0000 mg | Freq: Once | INTRAVENOUS | Status: AC
  Administered 2021-03-12: 420 mg via INTRAVENOUS
  Filled 2021-03-12: qty 14

## 2021-03-12 MED ORDER — SODIUM CHLORIDE 0.9 % IV SOLN
10.0000 mg | Freq: Once | INTRAVENOUS | Status: AC
  Administered 2021-03-12: 10 mg via INTRAVENOUS
  Filled 2021-03-12: qty 10

## 2021-03-12 MED ORDER — SODIUM CHLORIDE 0.9 % IV SOLN
724.2000 mg | Freq: Once | INTRAVENOUS | Status: AC
  Administered 2021-03-12: 720 mg via INTRAVENOUS
  Filled 2021-03-12: qty 72

## 2021-03-12 MED ORDER — SODIUM CHLORIDE 0.9% FLUSH
10.0000 mL | INTRAVENOUS | Status: DC | PRN
  Administered 2021-03-12: 10 mL
  Filled 2021-03-12: qty 10

## 2021-03-12 MED ORDER — SODIUM CHLORIDE 0.9% FLUSH
10.0000 mL | Freq: Once | INTRAVENOUS | Status: AC
Start: 2021-03-12 — End: 2021-03-12
  Administered 2021-03-12: 10 mL
  Filled 2021-03-12: qty 10

## 2021-03-12 MED ORDER — SODIUM CHLORIDE 0.9 % IV SOLN
75.0000 mg/m2 | Freq: Once | INTRAVENOUS | Status: AC
  Administered 2021-03-12: 140 mg via INTRAVENOUS
  Filled 2021-03-12: qty 14

## 2021-03-12 MED ORDER — ACETAMINOPHEN 325 MG PO TABS
650.0000 mg | ORAL_TABLET | Freq: Once | ORAL | Status: AC
  Administered 2021-03-12: 650 mg via ORAL

## 2021-03-12 MED ORDER — DIPHENHYDRAMINE HCL 25 MG PO CAPS
25.0000 mg | ORAL_CAPSULE | Freq: Once | ORAL | Status: AC
  Administered 2021-03-12: 25 mg via ORAL

## 2021-03-12 MED ORDER — ACETAMINOPHEN 325 MG PO TABS
ORAL_TABLET | ORAL | Status: AC
  Filled 2021-03-12: qty 1

## 2021-03-12 MED ORDER — HEPARIN SOD (PORK) LOCK FLUSH 100 UNIT/ML IV SOLN
500.0000 [IU] | Freq: Once | INTRAVENOUS | Status: AC | PRN
  Administered 2021-03-12: 500 [IU]
  Filled 2021-03-12: qty 5

## 2021-03-12 NOTE — Progress Notes (Signed)
[facility] Cancer Follow up:    Kristina Harvey, Kristina Saucer, MD [address] [place] [address]   DIAGNOSIS: Cancer Staging Malignant neoplasm of lower-outer quadrant of left breast of female, estrogen receptor negative (HCC) Staging form: Breast, AJCC 8th Edition - Clinical stage from 11/11/2020: Stage IIA (cT1c, cN1, cM0, G3, ER-, PR-, HER2+) - Signed by Serena Croissant, MD on 11/11/2020 Stage prefix: Initial diagnosis Histologic grading system: 3 grade system Laterality: Left Staged by: Pathologist and managing physician Stage used in treatment planning: Yes National guidelines used in treatment planning: Yes Type of national guideline used in treatment planning: NCCN   SUMMARY OF ONCOLOGIC HISTORY: Oncology History  Malignant neoplasm of lower-outer quadrant of left breast of female, estrogen receptor negative (HCC)  11/04/2020 Initial Diagnosis   Screening mammogram showed a possible left breast mass and enlarged axillary lymph nodes. Diagnostic mammogram and US showed a 1.4cm mass at the 3:30 position in the left breast and 2 abnormal lymph nodes in the left axilla. 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   11/11/2020 Cancer Staging   Staging form: Breast, AJCC 8th Edition - Clinical stage from 11/11/2020: Stage IIA (cT1c, cN1, cM0, G3, ER-, PR-, HER2+) - Signed by Serena Croissant, MD on 11/11/2020  Stage prefix: Initial diagnosis  Histologic grading system: 3 grade system  Laterality: Left  Staged by: Pathologist and managing physician  Stage used in treatment planning: Yes  National guidelines used in treatment planning: Yes  Type of national guideline used in treatment planning: NCCN    11/27/2020 -  Chemotherapy    Patient is on Treatment Plan: BREAST  DOCETAXEL + CARBOPLATIN + TRASTUZUMAB + PERTUZUMAB  (TCHP) Q21D          CURRENT THERAPY:TCHP    INTERVAL HISTORY: Kristina Harvey 55 y.o. female returns for evaluation prior to receiving cycle 6 of taxotere, carbo, herceptin and perjeta.  Her most recent echocardiogram was completed on 03/08/2021 and showed an EF of 60-65%.  She is scheduled for a breast MRI tomorrow to evaluate her chemotherapy response.  She notes she is feeling well.  She is continuing to work with tinting windows.  She has no peripheral neuropathy.  She denies any diarrhea, nausea, or vomiting.  She is mildly fatigued.  She is motivated to undergo her final chemotherapy today and proceed with her surgery.     Patient Active Problem List   Diagnosis Date Noted   Port-A-Cath in place 01/07/2021   Family history of breast cancer    Family history of prostate cancer    Family history of stomach cancer    Malignant neoplasm of lower-outer quadrant of left breast of female, estrogen receptor negative (HCC) 11/09/2020   Genetic testing 09/22/2017    is allergic to caffeine.  MEDICAL HISTORY: Past Medical History:  Diagnosis Date   Arrhythmia    Family history of breast cancer    Family history of prostate cancer    Family history of stomach cancer    Hyperlipidemia    Thyroid disease     SURGICAL HISTORY: Past Surgical History:  Procedure Laterality Date   chin implant  1996   IR IMAGING GUIDED PORT INSERTION  11/24/2020    SOCIAL HISTORY: Social History   Socioeconomic History   Marital status: Married    Spouse name: Not on file   Number of children: Not on file  Years of education: Not on file   Highest education level: Not on file  Occupational History   Not on file  Tobacco Use   Smoking status: Never   Smokeless tobacco: Never  Substance and Sexual Activity   Alcohol use: Yes    Comment: Socially   Drug use: Never   Sexual activity: Not on file  Other Topics Concern   Not on file  Social History Narrative   Not on file   Social Determinants of Health   Financial Resource Strain:  Not on file  Food Insecurity: Not on file  Transportation Needs: Not on file  Physical Activity: Not on file  Stress: Not on file  Social Connections: Not on file  Intimate Partner Violence: Not on file    FAMILY HISTORY: Family History  Problem Relation Age of Onset   Breast cancer Mother 43       bilateral   Atrial fibrillation Father    Breast cancer Maternal Grandmother 67   Breast cancer Paternal Grandmother 26   Breast cancer Maternal Aunt 67   Prostate cancer Maternal Uncle 68   Stomach cancer Paternal Grandfather 74   Heart Problems Maternal Aunt     Review of Systems  Constitutional:  Positive for fatigue. Negative for appetite change, chills, fever and unexpected weight change.  HENT:   Negative for hearing loss, lump/mass and trouble swallowing.   Eyes:  Negative for eye problems and icterus.  Respiratory:  Negative for chest tightness, cough and shortness of breath.   Cardiovascular:  Negative for chest pain, leg swelling and palpitations.  Gastrointestinal:  Negative for abdominal distention, abdominal pain, constipation, diarrhea, nausea and vomiting.  Endocrine: Negative for hot flashes.  Genitourinary:  Negative for difficulty urinating.   Musculoskeletal:  Negative for arthralgias.  Skin:  Negative for itching and rash.  Neurological:  Negative for dizziness, extremity weakness, headaches and numbness.  Hematological:  Negative for adenopathy. Does not bruise/bleed easily.  Psychiatric/Behavioral:  Negative for depression. The patient is not nervous/anxious.      PHYSICAL EXAMINATION  ECOG PERFORMANCE STATUS: 1 - Symptomatic but completely ambulatory  Vitals:   03/12/21 0958  BP: (!) 151/64  Pulse: 100  Resp: 18  Temp: 97.7 F (36.5 C)  SpO2: 100%    Physical Exam Constitutional:      General: She is not in acute distress.    Appearance: Normal appearance. She is not toxic-appearing.  HENT:     Head: Normocephalic and atraumatic.  Eyes:      General: No scleral icterus. Cardiovascular:     Rate and Rhythm: Normal rate and regular rhythm.     Pulses: Normal pulses.     Heart sounds: Normal heart sounds.  Pulmonary:     Effort: Pulmonary effort is normal.     Breath sounds: Normal breath sounds.  Abdominal:     General: Abdomen is flat. Bowel sounds are normal. There is no distension.     Palpations: Abdomen is soft.     Tenderness: There is no abdominal tenderness.  Musculoskeletal:        General: No swelling.     Cervical back: Neck supple.  Lymphadenopathy:     Cervical: No cervical adenopathy.  Skin:    General: Skin is warm and dry.     Findings: No rash.  Neurological:     General: No focal deficit present.     Mental Status: She is alert.  Psychiatric:  Mood and Affect: Mood normal.        Behavior: Behavior normal.    LABORATORY DATA:  CBC    Component Value Date/Time   WBC 6.1 03/12/2021 0944   RBC 2.81 (L) 03/12/2021 0944   HGB 10.0 (L) 03/12/2021 0944   HGB 13.1 11/11/2020 1242   HCT 30.3 (L) 03/12/2021 0944   PLT 202 03/12/2021 0944   PLT 264 11/11/2020 1242   MCV 107.8 (H) 03/12/2021 0944   MCH 35.6 (H) 03/12/2021 0944   MCHC 33.0 03/12/2021 0944   RDW 14.6 03/12/2021 0944   LYMPHSABS 0.6 (L) 03/12/2021 0944   MONOABS 0.4 03/12/2021 0944   EOSABS 0.0 03/12/2021 0944   BASOSABS 0.0 03/12/2021 0944    CMP     Component Value Date/Time   NA 140 03/12/2021 0944   K 3.6 03/12/2021 0944   CL 107 03/12/2021 0944   CO2 24 03/12/2021 0944   GLUCOSE 151 (H) 03/12/2021 0944   BUN 9 03/12/2021 0944   CREATININE 0.62 03/12/2021 0944   CREATININE 0.60 01/29/2021 0933   CALCIUM 9.0 03/12/2021 0944   PROT 6.6 03/12/2021 0944   ALBUMIN 3.5 03/12/2021 0944   AST 17 03/12/2021 0944   AST 20 01/29/2021 0933   ALT 15 03/12/2021 0944   ALT 19 01/29/2021 0933   ALKPHOS 72 03/12/2021 0944   BILITOT 0.3 03/12/2021 0944   BILITOT 0.4 01/29/2021 0933   GFRNONAA >60 03/12/2021 0944   GFRNONAA  >60 01/29/2021 0933   GFRAA >60 06/30/2019 1719    ASSESSMENT and PLAN:   Malignant neoplasm of lower-outer quadrant of left breast of female, estrogen receptor negative ([place]) 11/04/2020:Screening mammogram showed a possible left breast mass and enlarged axillary lymph nodes. Diagnostic mammogram and US showed a 1.4cm mass at the 3:30 position in the left breast and 2 abnormal lymph nodes in the left axilla. 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   Treatment Plan: 1. Neoadjuvant chemotherapy with TCH Perjeta 6 cycles followed by Herceptin Perjeta maintenance versus Kadcyla maintenance (based on response to neoadjuvant chemo) for 1 year 2. Followed by breast conserving surgery if possible with targeted node dissection versus ALND 3. Followed by adjuvant radiation therapy 4.  Consideration for neratinib ------------------------------------------------------------------------------------------------------------------------------- Current Treatment: Cycle 6 TCHP  (started 11/27/20)  Chemo Toxicities: 1.  Severe diarrhea: Diarrhea is now more manageable with probiotics and colestipol along with Metamucil. 2. anemia: Monitoring her hemoglobin today is 10.0 3.  Alopecia 4. Mild fatigue No neuropathy  She is completing the chemotherapy portion of her treatment today.  Her echo is up to date and normal.  She will undergo MRI tomorrow and we will see her back in 3 weeks for labs, f/u and Herceptin/Perjeta.   All questions were answered. The patient knows to call the clinic with any problems, questions or concerns. We can certainly see the patient much sooner if necessary.  Total encounter time: 30 minutes in face to face visit time, chart review, lab review, order entry, care coordination, and documentation of the encounter.    Wilber Bihari, NP 03/12/21 10:46 AM Medical Oncology and Hematology [facility], [place], [place] [address] Tel. [phone]    Fax. [phone]  *Total Encounter Time as defined by the Centers for Medicare and Medicaid Services includes, in addition to the face-to-face time of a patient visit (documented in the note above) non-face-to-face time:  obtaining and reviewing outside history, ordering and reviewing medications, tests or procedures, care coordination (communications with other health care professionals or caregivers) and documentation in the medical record.

## 2021-03-12 NOTE — Assessment & Plan Note (Addendum)
11/04/2020:Screening mammogram showed a possible left breast mass and enlarged axillary lymph nodes. Diagnostic mammogram and US showed a 1.4cm mass at the 3:30 position in the left breast and 2 abnormal lymph nodes in the left axilla. 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  Treatment Plan: 1.Neoadjuvant chemotherapy with TCH Perjeta 6 cycles followed by Herceptin Perjeta maintenance versus Kadcyla maintenance (based on response to neoadjuvant chemo) for 1 year 2. Followed by breast conserving surgery if possible withtargeted node dissection versus ALND 3. Followed by adjuvant radiation therapy 4.Consideration for neratinib ------------------------------------------------------------------------------------------------------------------------------- Current Treatment: Cycle6TCHP(started4/15/22)  Chemo Toxicities: 1.Severe diarrhea:Diarrhea is now more manageable with probiotics and colestipol along with Metamucil. 2.anemia: Monitoring her hemoglobintoday is 10.0 3.Alopecia 4. Mild fatigue No neuropathy  She is completing the chemotherapy portion of her treatment today.  Her echo is up to date and normal.  She will undergo MRI tomorrow and we will see her back in 3 weeks for labs, f/u and Herceptin/Perjeta.

## 2021-03-12 NOTE — Patient Instructions (Addendum)
[facility]  Discharge Instructions: Thank you for choosing [facility] to provide your oncology and hematology care.   If you have a lab appointment with the [facility], please go directly to the [facility] and check in at the registration area.   Wear comfortable clothing and clothing appropriate for easy access to any Portacath or PICC line.   We strive to give you quality time with your provider. You may need to reschedule your appointment if you arrive late (15 or more minutes).  Arriving late affects you and other patients whose appointments are after yours.  Also, if you miss three or more appointments without notifying the office, you may be dismissed from the clinic at the provider's discretion.      For prescription refill requests, have your pharmacy contact our office and allow 72 hours for refills to be completed.    Today you received the following chemotherapy and/or immunotherapy agents: trastuzumab/pertuzumab/docetaxel/carboplatin.      To help prevent nausea and vomiting after your treatment, we encourage you to take your nausea medication as directed.  BELOW ARE SYMPTOMS THAT SHOULD BE REPORTED IMMEDIATELY: *FEVER GREATER THAN 100.4 F (38 C) OR HIGHER *CHILLS OR SWEATING *NAUSEA AND VOMITING THAT IS NOT CONTROLLED WITH YOUR NAUSEA MEDICATION *UNUSUAL SHORTNESS OF BREATH *UNUSUAL BRUISING OR BLEEDING *URINARY PROBLEMS (pain or burning when urinating, or frequent urination) *BOWEL PROBLEMS (unusual diarrhea, constipation, pain near the anus) TENDERNESS IN MOUTH AND THROAT WITH OR WITHOUT PRESENCE OF ULCERS (sore throat, sores in mouth, or a toothache) UNUSUAL RASH, SWELLING OR PAIN  UNUSUAL VAGINAL DISCHARGE OR ITCHING   Items with * indicate a potential emergency and should be followed up as soon as possible or go to the Emergency Department if any problems should occur.  Please show the CHEMOTHERAPY ALERT CARD or  IMMUNOTHERAPY ALERT CARD at check-in to the Emergency Department and triage nurse.  Should you have questions after your visit or need to cancel or reschedule your appointment, please contact [facility]  Dept: [phone]  and follow the prompts.  Office hours are 8:00 a.m. to 4:30 p.m. Monday - Friday. Please note that voicemails left after 4:00 p.m. may not be returned until the following business day.  We are closed weekends and major holidays. You have access to a nurse at all times for urgent questions. Please call the main number to the clinic Dept: [phone] and follow the prompts.   For any non-urgent questions, you may also contact your provider using MyChart. We now offer e-Visits for anyone 65 and older to request care online for non-urgent symptoms. For details visit [email or web address].   Also download the MyChart app! Go to the app store, search "MyChart", open the app, select [facility], and log in with your MyChart username and password.  Due to Covid, a mask is required upon entering the hospital/clinic. If you do not have a mask, one will be given to you upon arrival. For doctor visits, patients may have 1 support person aged 84 or older with them. For treatment visits, patients cannot have anyone with them due to current Covid guidelines and our immunocompromised population.  [id number]

## 2021-03-13 ENCOUNTER — Ambulatory Visit
Admission: RE | Admit: 2021-03-13 | Discharge: 2021-03-13 | Disposition: A | Discharge status: Home / Self Care | Payer: Managed Care, Other (non HMO) | Source: Ambulatory Visit | Attending: Hematology and Oncology | Admitting: Hematology and Oncology

## 2021-03-13 DIAGNOSIS — C50512 Malignant neoplasm of lower-outer quadrant of left female breast: Secondary | ICD-10-CM

## 2021-03-13 DIAGNOSIS — Z171 Estrogen receptor negative status [ER-]: Secondary | ICD-10-CM

## 2021-03-13 MED ORDER — GADOBUTROL 1 MMOL/ML IV SOLN
8.0000 mL | Freq: Once | INTRAVENOUS | Status: AC | PRN
  Administered 2021-03-13: 8 mL via INTRAVENOUS

## 2021-03-15 ENCOUNTER — Encounter: Payer: Self-pay | Admitting: *Deleted

## 2021-03-15 ENCOUNTER — Telehealth: Payer: Self-pay | Admitting: Hematology and Oncology

## 2021-03-15 NOTE — Telephone Encounter (Signed)
Scheduled appointment per 07/29 los. Patient is aware.

## 2021-03-15 NOTE — Telephone Encounter (Signed)
Patient has completed chemotherapy and we will not refill this medication at this time.

## 2021-03-16 ENCOUNTER — Other Ambulatory Visit: Payer: Self-pay

## 2021-03-16 ENCOUNTER — Ambulatory Visit
Admission: RE | Admit: 2021-03-16 | Discharge: 2021-03-16 | Disposition: A | Discharge status: Home / Self Care | Payer: Managed Care, Other (non HMO) | Source: Ambulatory Visit | Attending: Hematology and Oncology | Admitting: Hematology and Oncology

## 2021-03-16 DIAGNOSIS — Z171 Estrogen receptor negative status [ER-]: Secondary | ICD-10-CM

## 2021-03-16 DIAGNOSIS — C50512 Malignant neoplasm of lower-outer quadrant of left female breast: Secondary | ICD-10-CM

## 2021-03-22 ENCOUNTER — Other Ambulatory Visit: Payer: Self-pay | Admitting: Hematology and Oncology

## 2021-03-24 ENCOUNTER — Other Ambulatory Visit: Payer: Managed Care, Other (non HMO)

## 2021-03-26 ENCOUNTER — Other Ambulatory Visit: Payer: Self-pay | Admitting: Hematology and Oncology

## 2021-04-01 ENCOUNTER — Ambulatory Visit: Payer: Self-pay | Admitting: General Surgery

## 2021-04-01 DIAGNOSIS — C50512 Malignant neoplasm of lower-outer quadrant of left female breast: Secondary | ICD-10-CM

## 2021-04-01 DIAGNOSIS — Z171 Estrogen receptor negative status [ER-]: Secondary | ICD-10-CM

## 2021-04-01 NOTE — Progress Notes (Signed)
Patient Care Team: Alroy Dust, L.Marlou Sa, MD as PCP - General (Family Medicine) Jovita Kussmaul, MD as Consulting Physician (General Surgery) Nicholas Lose, MD as Consulting Physician (Hematology and Oncology) Gery Pray, MD as Consulting Physician (Radiation Oncology) Mauro Kaufmann, RN as Oncology Nurse Navigator Rockwell Germany, RN as Oncology Nurse Navigator  DIAGNOSIS:    ICD-10-CM   1. Malignant neoplasm of lower-outer quadrant of left breast of female, estrogen receptor negative ([place])  C50.512    Z17.1       SUMMARY OF ONCOLOGIC HISTORY: Oncology History  Malignant neoplasm of lower-outer quadrant of left breast of female, estrogen receptor negative ([place])  11/04/2020 Initial Diagnosis   Screening mammogram showed a possible left breast mass and enlarged axillary lymph nodes. Diagnostic mammogram and US showed a 1.4cm mass at the 3:30 position in the left breast and 2 abnormal lymph nodes in the left axilla. 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   11/11/2020 Cancer Staging   Staging form: Breast, AJCC 8th Edition - Clinical stage from 11/11/2020: Stage IIA (cT1c, cN1, cM0, G3, ER-, PR-, HER2+) - Signed by Nicholas Lose, MD on 11/11/2020 Stage prefix: Initial diagnosis Histologic grading system: 3 grade system Laterality: Left Staged by: Pathologist and managing physician Stage used in treatment planning: Yes National guidelines used in treatment planning: Yes Type of national guideline used in treatment planning: NCCN   11/27/2020 -  Chemotherapy    Patient is on Treatment Plan: BREAST  DOCETAXEL + CARBOPLATIN + TRASTUZUMAB + PERTUZUMAB  (TCHP) Q21D          CHIEF COMPLIANT: Herceptin Perjeta  INTERVAL HISTORY: Kristina Harvey is a 55 y.o. with above-mentioned history of  breast cancer currently on neoadjuvant chemotherapy with [place]. She presents to the clinic today for  Herceptin and Perjeta maintenance.  Overall she is tolerating chemo fairly well.  Her major side effects are neuropathy and hair loss.  ALLERGIES:  is allergic to caffeine.  MEDICATIONS:  Current Outpatient Medications  Medication Sig Dispense Refill   ALPRAZolam (XANAX) 0.25 MG tablet Take 0.25 mg by mouth at bedtime as needed for sleep.     colestipol (COLESTID) 1 g tablet Take 2 g by mouth at bedtime.     levothyroxine (SYNTHROID) 75 MCG tablet Take 75 mcg by mouth daily before breakfast.     lidocaine-prilocaine (EMLA) cream Apply to affected area once 30 g 3   metoprolol succinate (TOPROL-XL) 25 MG 24 hr tablet TAKE ONE TABLET BY MOUTH DAILY WITH OR IMMEDIATELY FOLLOWING A MEAL (Patient taking differently: Take 25 mg by mouth daily.) 90 tablet 1   ondansetron (ZOFRAN) 8 MG tablet Take 1 tablet (8 mg total) by mouth 2 (two) times daily as needed (Nausea or vomiting). Start on the third day after chemotherapy. 30 tablet 1   prochlorperazine (COMPAZINE) 10 MG tablet Take 1 tablet (10 mg total) by mouth every 6 (six) hours as needed (Nausea or vomiting). 30 tablet 1   No current facility-administered medications for this visit.    PHYSICAL EXAMINATION: ECOG PERFORMANCE STATUS: 1 - Symptomatic but completely ambulatory  Vitals:   04/02/21 1002  BP: 131/65  Pulse: 87  Resp: 18  Temp: (!) 97 F (36.1 C)  SpO2: 100%   Filed Weights   04/02/21 1002  Weight: 162 lb 1.6 oz (73.5 kg)     LABORATORY DATA:  I have reviewed the data as  listed CMP Latest Ref Rng & Units 03/12/2021 02/19/2021 01/29/2021  Glucose 70 - 99 mg/dL 151(H) 89 139(H)  BUN 6 - 20 mg/dL _0 Creatinine 0.44 - 1.00 mg/dL 0.62 0.61 0.60  Sodium 135 - 145 mmol/L 140 139 137  Potassium 3.5 - 5.1 mmol/L 3.6 3.8 4.0  Chloride 98 - 111 mmol/L 107 105 104  CO2 22 - 32 mmol/L _1 Calcium 8.9 - 10.3 mg/dL 9.0 9.1 9.5  Total Protein 6.5 - 8.1 g/dL 6.6 6.8 7.0  Total Bilirubin 0.3 - 1.2 mg/dL 0.3 0.5 0.4   Alkaline Phos 38 - 126 U/L 72 66 63  AST 15 - 41 U/L _2 ALT 0 - 44 U/L _3 Lab Results  Component Value Date   WBC 6.0 04/02/2021   HGB 10.4 (L) 04/02/2021   HCT 30.8 (L) 04/02/2021   MCV 110.4 (H) 04/02/2021   PLT 221 04/02/2021   NEUTROABS 3.7 04/02/2021    ASSESSMENT & PLAN:  Malignant neoplasm of lower-outer quadrant of left breast of female, estrogen receptor negative ([place]) 11/04/2020:Screening mammogram showed a possible left breast mass and enlarged axillary lymph nodes. Diagnostic mammogram and US showed a 1.4cm mass at the 3:30 position in the left breast and 2 abnormal lymph nodes in the left axilla. 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   Treatment Plan: 1. Neoadjuvant chemotherapy with TCH Perjeta 6 cycles completed 03/12/2021 followed by Herceptin Perjeta maintenance versus Kadcyla maintenance (based on response to neoadjuvant chemo) for 1 year 2. Followed by breast conserving surgery if possible with targeted node dissection versus ALND 3. Followed by adjuvant radiation therapy 4.  Consideration for neratinib ------------------------------------------------------------------------------------------------------------------------------- Current Treatment: Completed 6 cycles of (11/27/20-03/12/2021), currently on Herceptin Perjeta maintenance   Chemo Toxicities: 1.  Severe diarrhea: Diarrhea is now more manageable with probiotics and colestipol along with Metamucil. 2. anemia: Monitoring her hemoglobin today is 10.4 3.  Alopecia 4. Mild fatigue No neuropathy  Breast MRI 03/15/2021: Marked interval improvement in the appearance of breast cancer with no residual mass or enhancement, both of the abnormal lymph nodes improved in size and appearance.  Return to clinic 1 week after surgery to discuss the pathology report and determine if she will continue with Herceptin  Perjeta maintenance or switch to Kadcyla.    No orders of the defined types were placed in this encounter.  The patient has a good understanding of the overall plan. she agrees with it. she will call with any problems that may develop before the next visit here.  Total time spent: 30 mins including face to face time and time spent for planning, charting and coordination of care  Rulon Eisenmenger, MD, MPH 04/02/2021  I, Thana Ates, am acting as scribe for Dr. Nicholas Lose.  I have reviewed the above documentation for accuracy and completeness, and I agree with the above.

## 2021-04-02 ENCOUNTER — Inpatient Hospital Stay: Payer: Managed Care, Other (non HMO) | Attending: Hematology and Oncology

## 2021-04-02 ENCOUNTER — Other Ambulatory Visit: Payer: Self-pay

## 2021-04-02 ENCOUNTER — Encounter: Payer: Self-pay | Admitting: *Deleted

## 2021-04-02 ENCOUNTER — Inpatient Hospital Stay (HOSPITAL_BASED_OUTPATIENT_CLINIC_OR_DEPARTMENT_OTHER): Payer: Managed Care, Other (non HMO) | Admitting: Hematology and Oncology

## 2021-04-02 ENCOUNTER — Inpatient Hospital Stay: Payer: Managed Care, Other (non HMO)

## 2021-04-02 VITALS — BP 134/72 | HR 74 | Resp 18

## 2021-04-02 DIAGNOSIS — C50512 Malignant neoplasm of lower-outer quadrant of left female breast: Secondary | ICD-10-CM

## 2021-04-02 DIAGNOSIS — Z5112 Encounter for antineoplastic immunotherapy: Secondary | ICD-10-CM | POA: Diagnosis present

## 2021-04-02 DIAGNOSIS — Z171 Estrogen receptor negative status [ER-]: Secondary | ICD-10-CM | POA: Diagnosis not present

## 2021-04-02 DIAGNOSIS — Z95828 Presence of other vascular implants and grafts: Secondary | ICD-10-CM

## 2021-04-02 LAB — COMPREHENSIVE METABOLIC PANEL
ALT: 15 U/L (ref 0–44)
AST: 26 U/L (ref 15–41)
Albumin: 3.5 g/dL (ref 3.5–5.0)
Alkaline Phosphatase: 63 U/L (ref 38–126)
Anion gap: 11 (ref 5–15)
BUN: 12 mg/dL (ref 6–20)
CO2: 23 mmol/L (ref 22–32)
Calcium: 9.1 mg/dL (ref 8.9–10.3)
Chloride: 105 mmol/L (ref 98–111)
Creatinine, Ser: 0.67 mg/dL (ref 0.44–1.00)
GFR, Estimated: >60 mL/min (ref >60)
Glucose, Bld: 99 mg/dL (ref 70–99)
Potassium: 4.2 mmol/L (ref 3.5–5.1)
Sodium: 139 mmol/L (ref 135–145)
Total Bilirubin: 0.5 mg/dL (ref 0.3–1.2)
Total Protein: 6.6 g/dL (ref 6.5–8.1)

## 2021-04-02 LAB — CBC WITH DIFFERENTIAL/PLATELET
Abs Immature Granulocytes: 0.01 10*3/uL (ref 0.00–0.07)
Basophils Absolute: 0 10*3/uL (ref 0.0–0.1)
Basophils Relative: 0 %
Eosinophils Absolute: 0.2 10*3/uL (ref 0.0–0.5)
Eosinophils Relative: 3 %
HCT: 30.8 % — ABNORMAL LOW (ref 36.0–46.0)
Hemoglobin: 10.4 g/dL — ABNORMAL LOW (ref 12.0–15.0)
Immature Granulocytes: 0 %
Lymphocytes Relative: 21 %
Lymphs Abs: 1.2 10*3/uL (ref 0.7–4.0)
MCH: 37.3 pg — ABNORMAL HIGH (ref 26.0–34.0)
MCHC: 33.8 g/dL (ref 30.0–36.0)
MCV: 110.4 fL — ABNORMAL HIGH (ref 80.0–100.0)
Monocytes Absolute: 0.9 10*3/uL (ref 0.1–1.0)
Monocytes Relative: 15 %
Neutro Abs: 3.7 10*3/uL (ref 1.7–7.7)
Neutrophils Relative %: 61 %
Platelets: 221 10*3/uL (ref 150–400)
RBC: 2.79 MIL/uL — ABNORMAL LOW (ref 3.87–5.11)
RDW: 14.3 % (ref 11.5–15.5)
WBC: 6 10*3/uL (ref 4.0–10.5)
nRBC: 0 % (ref 0.0–0.2)

## 2021-04-02 MED ORDER — SODIUM CHLORIDE 0.9% FLUSH
10.0000 mL | INTRAVENOUS | Status: DC | PRN
  Administered 2021-04-02: 10 mL

## 2021-04-02 MED ORDER — TRASTUZUMAB-ANNS CHEMO 150 MG IV SOLR
6.0000 mg/kg | Freq: Once | INTRAVENOUS | Status: AC
  Administered 2021-04-02: 462 mg via INTRAVENOUS
  Filled 2021-04-02: qty 22

## 2021-04-02 MED ORDER — SODIUM CHLORIDE 0.9% FLUSH
10.0000 mL | Freq: Once | INTRAVENOUS | Status: AC
  Administered 2021-04-02: 10 mL

## 2021-04-02 MED ORDER — DIPHENHYDRAMINE HCL 25 MG PO CAPS
25.0000 mg | ORAL_CAPSULE | Freq: Once | ORAL | Status: AC
  Administered 2021-04-02: 25 mg via ORAL
  Filled 2021-04-02: qty 1

## 2021-04-02 MED ORDER — SODIUM CHLORIDE 0.9 % IV SOLN
420.0000 mg | Freq: Once | INTRAVENOUS | Status: AC
  Administered 2021-04-02: 420 mg via INTRAVENOUS
  Filled 2021-04-02: qty 14

## 2021-04-02 MED ORDER — SODIUM CHLORIDE 0.9 % IV SOLN: Freq: Once | INTRAVENOUS | Status: AC

## 2021-04-02 MED ORDER — HEPARIN SOD (PORK) LOCK FLUSH 100 UNIT/ML IV SOLN
500.0000 [IU] | Freq: Once | INTRAVENOUS | Status: AC | PRN
Start: 2021-04-02 — End: 2021-04-02
  Administered 2021-04-02: 500 [IU]

## 2021-04-02 MED ORDER — ACETAMINOPHEN 325 MG PO TABS
650.0000 mg | ORAL_TABLET | Freq: Once | ORAL | Status: AC
  Administered 2021-04-02: 650 mg via ORAL
  Filled 2021-04-02: qty 2

## 2021-04-02 NOTE — Patient Instructions (Signed)
[facility] MEDICAL ONCOLOGY  Discharge Instructions: Thank you for choosing [facility] to provide your oncology and hematology care.   If you have a lab appointment with the Cancer Center, please go directly to the Cancer Center and check in at the registration area.   Wear comfortable clothing and clothing appropriate for easy access to any Portacath or PICC line.   We strive to give you quality time with your provider. You may need to reschedule your appointment if you arrive late (15 or more minutes).  Arriving late affects you and other patients whose appointments are after yours.  Also, if you miss three or more appointments without notifying the office, you may be dismissed from the clinic at the provider's discretion.      For prescription refill requests, have your pharmacy contact our office and allow 72 hours for refills to be completed.    Today you received the following chemotherapy and/or immunotherapy agents: Kanjinti, Perjeta      To help prevent nausea and vomiting after your treatment, we encourage you to take your nausea medication as directed.  BELOW ARE SYMPTOMS THAT SHOULD BE REPORTED IMMEDIATELY: *FEVER GREATER THAN 100.4 F (38 C) OR HIGHER *CHILLS OR SWEATING *NAUSEA AND VOMITING THAT IS NOT CONTROLLED WITH YOUR NAUSEA MEDICATION *UNUSUAL SHORTNESS OF BREATH *UNUSUAL BRUISING OR BLEEDING *URINARY PROBLEMS (pain or burning when urinating, or frequent urination) *BOWEL PROBLEMS (unusual diarrhea, constipation, pain near the anus) TENDERNESS IN MOUTH AND THROAT WITH OR WITHOUT PRESENCE OF ULCERS (sore throat, sores in mouth, or a toothache) UNUSUAL RASH, SWELLING OR PAIN  UNUSUAL VAGINAL DISCHARGE OR ITCHING   Items with * indicate a potential emergency and should be followed up as soon as possible or go to the Emergency Department if any problems should occur.  Please show the CHEMOTHERAPY ALERT CARD or IMMUNOTHERAPY ALERT CARD at  check-in to the Emergency Department and triage nurse.  Should you have questions after your visit or need to cancel or reschedule your appointment, please contact [facility]  Dept: [phone]  and follow the prompts.  Office hours are 8:00 a.m. to 4:30 p.m. Monday - Friday. Please note that voicemails left after 4:00 p.m. may not be returned until the following business day.  We are closed weekends and major holidays. You have access to a nurse at all times for urgent questions. Please call the main number to the clinic Dept: [phone] and follow the prompts.   For any non-urgent questions, you may also contact your provider using MyChart. We now offer e-Visits for anyone 18 and older to request care online for non-urgent symptoms. For details visit [email or web address].   Also download the MyChart app! Go to the app store, search "MyChart", open the app, select [facility], and log in with your MyChart username and password.  Due to Covid, a mask is required upon entering the hospital/clinic. If you do not have a mask, one will be given to you upon arrival. For doctor visits, patients may have 1 support person aged 18 or older with them. For treatment visits, patients cannot have anyone with them due to current Covid guidelines and our immunocompromised population.  

## 2021-04-02 NOTE — Progress Notes (Signed)
Patient remained for 15 minutes post obs for Perjeta.

## 2021-04-02 NOTE — Assessment & Plan Note (Signed)
11/04/2020:Screening mammogram showed a possible left breast mass and enlarged axillary lymph nodes. Diagnostic mammogram and US showed a 1.4cm mass at the 3:30 position in the left breast and 2 abnormal lymph nodes in the left axilla. 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  Treatment Plan: 1.Neoadjuvant chemotherapy with TCH Perjeta 6 cycles completed 03/12/2021 followed by Herceptin Perjeta maintenance versus Kadcyla maintenance (based on response to neoadjuvant chemo) for 1 year 2. Followed by breast conserving surgery if possible withtargeted node dissection versus ALND 3. Followed by adjuvant radiation therapy 4.Consideration for neratinib ------------------------------------------------------------------------------------------------------------------------------- Current Treatment: Completed 6 cycles of (11/27/20-03/12/2021), currently on Herceptin Perjeta maintenance  Chemo Toxicities: 1.Severe diarrhea:Diarrhea is now more manageable with probiotics and colestipol along with Metamucil. 2.anemia: Monitoring her hemoglobintoday is 10.0 3.Alopecia 4. Mild fatigue No neuropathy  Breast MRI 03/15/2021: Marked interval improvement in the appearance of breast cancer with no residual mass or enhancement, both of the abnormal lymph nodes improved in size and appearance.  Return to clinic 1 week after surgery to discuss the pathology report and determine if she will continue with Herceptin Perjeta maintenance or switch to Kadcyla.

## 2021-04-05 ENCOUNTER — Telehealth: Payer: Self-pay | Admitting: Hematology and Oncology

## 2021-04-05 ENCOUNTER — Other Ambulatory Visit: Payer: Self-pay | Admitting: General Surgery

## 2021-04-05 DIAGNOSIS — Z171 Estrogen receptor negative status [ER-]: Secondary | ICD-10-CM

## 2021-04-05 DIAGNOSIS — C50512 Malignant neoplasm of lower-outer quadrant of left female breast: Secondary | ICD-10-CM

## 2021-04-05 NOTE — Telephone Encounter (Signed)
Scheduled appt per 8/19 sch msg. Pt aware.

## 2021-04-07 ENCOUNTER — Other Ambulatory Visit: Payer: Self-pay | Admitting: *Deleted

## 2021-04-07 DIAGNOSIS — C50512 Malignant neoplasm of lower-outer quadrant of left female breast: Secondary | ICD-10-CM

## 2021-04-07 DIAGNOSIS — Z171 Estrogen receptor negative status [ER-]: Secondary | ICD-10-CM

## 2021-04-07 NOTE — Progress Notes (Signed)
Surgical Instructions    Your procedure is scheduled on Friday 04/16/21.   Report to [facility] Main Entrance "A" at 08:00 A.M., then check in with the Admitting office.  Call this number if you have problems the morning of surgery:  [phone]   If you have any questions prior to your surgery date call [phone]: Open Monday-Friday 8am-4pm    Remember:  Do not eat after midnight the night before your surgery  You may drink clear liquids until 07:00 A.M. the morning of your surgery.   Clear liquids allowed are: Water, Non-Citrus Juices (without pulp), Carbonated Beverages, Clear Tea, Black Coffee with (NO MILK, CREAM OR POWDERED CREAMER), and Gatorade    Take these medicines the morning of surgery with A SIP OF WATER   levothyroxine (SYNTHROID)   metoprolol succinate (TOPROL-XL  Polyethyl Glycol-Propyl Glycol (SYSTANE OP)   Take these medicines if needed:   acetaminophen (TYLENOL)      As of today, STOP taking any Aspirin (unless otherwise instructed by your surgeon) Aleve, Naproxen, Ibuprofen, Motrin, Advil, Goody's, BC's, all herbal medications, fish oil, and all vitamins.          Do not wear jewelry or makeup Do not wear lotions, powders, perfumes/colognes, or deodorant. Do not shave 48 hours prior to surgery.  Men may shave face and neck. Do not bring valuables to the hospital. DO Not wear nail polish, gel polish, artificial nails, or any other type of covering on natural nails including finger and toenails. If patients have artificial nails, gel coating, etc. that need to be removed by a nail salon please have this removed prior to surgery or surgery may need to be canceled/delayed if the surgeon/ anesthesia feels like the patient is unable to be adequately monitored.             [facility] is not responsible for any belongings or valuables.  Do NOT Smoke (Tobacco/Vaping) or drink Alcohol 24 hours prior to your procedure If you use a CPAP at night, you may bring all  equipment for your overnight stay.   Contacts, glasses, dentures or bridgework may not be worn into surgery, please bring cases for these belongings   For patients admitted to the hospital, discharge time will be determined by your treatment team.   Patients discharged the day of surgery will not be allowed to drive home, and someone needs to stay with them for 24 hours.  ONLY 1 SUPPORT PERSON MAY BE PRESENT WHILE YOU ARE IN SURGERY. IF YOU ARE TO BE ADMITTED ONCE YOU ARE IN YOUR ROOM YOU WILL BE ALLOWED TWO (2) VISITORS.  Minor children may have two parents present. Special consideration for safety and communication needs will be reviewed on a case by case basis.  Special instructions:    Oral Hygiene is also important to reduce your risk of infection.  Remember - BRUSH YOUR TEETH THE MORNING OF SURGERY WITH YOUR REGULAR TOOTHPASTE   [facility]- Preparing For Surgery  Before surgery, you can play an important role. Because skin is not sterile, your skin needs to be as free of germs as possible. You can reduce the number of germs on your skin by washing with CHG (chlorahexidine gluconate) Soap before surgery.  CHG is an antiseptic cleaner which kills germs and bonds with the skin to continue killing germs even after washing.     Please do not use if you have an allergy to CHG or antibacterial soaps. If your skin becomes reddened/irritated stop  using the CHG.  Do not shave (including legs and underarms) for at least 48 hours prior to first CHG shower. It is OK to shave your face.  Please follow these instructions carefully.     Shower the NIGHT BEFORE SURGERY and the MORNING OF SURGERY with CHG Soap.   If you chose to wash your hair, wash your hair first as usual with your normal shampoo. After you shampoo, rinse your hair and body thoroughly to remove the shampoo.  Then [employer] and genitals (private parts) with your normal soap and rinse thoroughly to remove soap.  After that Use CHG  Soap as you would any other liquid soap. You can apply CHG directly to the skin and wash gently with a scrungie or a clean washcloth.   Apply the CHG Soap to your body ONLY FROM THE NECK DOWN.  Do not use on open wounds or open sores. Avoid contact with your eyes, ears, mouth and genitals (private parts). Wash Face and genitals (private parts)  with your normal soap.   Wash thoroughly, paying special attention to the area where your surgery will be performed.  Thoroughly rinse your body with warm water from the neck down.  DO NOT shower/wash with your normal soap after using and rinsing off the CHG Soap.  Pat yourself dry with a CLEAN TOWEL.  Wear CLEAN PAJAMAS to bed the night before surgery  Place CLEAN SHEETS on your bed the night before your surgery  DO NOT SLEEP WITH PETS.   Day of Surgery:  Take a shower with CHG soap. Wear Clean/Comfortable clothing the morning of surgery Do not apply any deodorants/lotions.   Remember to brush your teeth WITH YOUR REGULAR TOOTHPASTE.   Please read over the following fact sheets that you were given.

## 2021-04-07 NOTE — Progress Notes (Signed)
error 

## 2021-04-08 ENCOUNTER — Encounter (HOSPITAL_COMMUNITY)
Admission: RE | Admit: 2021-04-08 | Discharge: 2021-04-08 | Disposition: A | Discharge status: Home / Self Care | Payer: Managed Care, Other (non HMO) | Source: Ambulatory Visit | Attending: General Surgery | Admitting: General Surgery

## 2021-04-08 ENCOUNTER — Encounter (HOSPITAL_COMMUNITY): Payer: Self-pay

## 2021-04-08 ENCOUNTER — Other Ambulatory Visit: Payer: Self-pay

## 2021-04-08 DIAGNOSIS — Z01812 Encounter for preprocedural laboratory examination: Secondary | ICD-10-CM | POA: Insufficient documentation

## 2021-04-08 HISTORY — DX: Malignant (primary) neoplasm, unspecified: C80.1

## 2021-04-08 HISTORY — DX: Hypothyroidism, unspecified: E03.9

## 2021-04-08 LAB — NO BLOOD PRODUCTS

## 2021-04-08 NOTE — Progress Notes (Signed)
PCP - Donnie Coffin MD Cardiologist - Ida Rogue MD  PPM/ICD -denies  Device Orders -  Rep Notified -   Chest x-ray - 12/16/20 EKG - 06/30/20 Stress Test - none ECHO - 03/08/21 Cardiac Cath - none  Sleep Study - denies CPAP - no  Fasting Blood Sugar - n/a Checks Blood Sugar _____ times a day  Blood Thinner Instructions:n/a Aspirin Instructions:n/a  ERAS Protcol -clear liquids until 0700 PRE-SURGERY Ensure or G2- no  COVID TEST- n/a-ambulatory surgery   Anesthesia review: yes  Patient denies shortness of breath, fever, cough and chest pain at PAT appointment   All instructions explained to the patient, with a verbal understanding of the material. Patient agrees to go over the instructions while at home for a better understanding. Patient also instructed to self quarantine after being tested for COVID-19. The opportunity to ask questions was provided.

## 2021-04-09 ENCOUNTER — Telehealth: Payer: Managed Care, Other (non HMO) | Admitting: Plastic Surgery

## 2021-04-09 NOTE — Progress Notes (Signed)
Anesthesia Chart Review:  Case: [id number] Date/Time: 04/16/21 0945   Procedures:      LEFT BREAST LUMPECTOMY WITH RADIOACTIVE SEED AND SENTINEL LYMPH NODE BIOPSY (Left: Breast)     RADIOACTIVE SEED GUIDED AXILLARY SENTINEL LYMPH NODE DISSECTION (Left: Breast)   Anesthesia type: General   Pre-op diagnosis: LEFT BREAST CANCER   Location: [facility] OR ROOM 02 / [place] OR   Surgeons: Jovita Kussmaul, MD       DISCUSSION: Patient is a 55 year old female scheduled for the above procedure.  History includes never smoker, HLT, hypothyroidism, PAF, breast cancer (left breast biopsy 11/04/20: invasive ductal carcinoma, + left axillary LN; right IJ power Port placement 11/24/20, s/p chemotherapy TCH Perjeta 6 cycles completed 03/12/21 with maintenance Herceptin Perjeta versus Kadcyla), chin implant.   Last visit with cardiologist Dr. Rockey Situ on 06/30/20 for follow-up PAF, HLD. Metoprolol succinate 25 mg recommended and can increased to 37.5 mg daily if frequent breakthrough episodes. Extra dose of metoprolol tartrate suggested when driving down to the beach, as she has more episodes when she is a passenger in a car.  He notes she is not on anticoagulation therapy due to low CHA2DS2-VASc score.  50-monthfollow-up planned. LVEF 60-65%, normal wall motion by 02/2021 echo (for chemo).   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 H/H 10.4/30.8 on 04/02/21, previously 10./30.3 on 03/12/21. Last Herceptin Perjeta 04/02/21.  RSL scheduled for 04/15/21 at 1:00 PM. Anesthesia team to evaluate on the day of surgery.   VS: BP 136/70   Pulse 90   Temp 36.7 C (Oral)   Resp 18   Ht 5' 7.5" (1.715 m)   Wt 73.3 kg   SpO2 100%   BMI 24.92 kg/m   PROVIDERS: Mitchell, L.DMarlou Sa MD is PCP GIda Rogue MD is cardiologist GNicholas Lose MD is HEM-ONC.  Last visit 04/02/2021 with plans for her to follow-up 1 week after surgery to discuss pathology report and determine maintenance therapy KGery Pray MD is  RAD-ONC  LABS: Lab results as of 04/02/21 include: Lab Results  Component Value Date   WBC 6.0 04/02/2021   HGB 10.4 (L) 04/02/2021   HCT 30.8 (L) 04/02/2021   PLT 221 04/02/2021   GLUCOSE 99 04/02/2021   ALT 15 04/02/2021   AST 26 04/02/2021   NA 139 04/02/2021   K 4.2 04/02/2021   CL 105 04/02/2021   CREATININE 0.67 04/02/2021   BUN 12 04/02/2021   CO2 23 04/02/2021   She signed a Refusal of all blood and blood products form.    IMAGES: CXR 12/16/20: FINDINGS: A new right-sided power port is seen with tip overlying the mid to distal SVC. No evidence of pneumothorax. Both lungs are clear. Heart size is normal. IMPRESSION: New right-sided power port in appropriate position. No active cardiopulmonary disease.    EKG: 06/30/20: NSR   CV: Echo 03/08/21: IMPRESSIONS   1. Left ventricular ejection fraction, by estimation, is 60 to 65%. The  left ventricle has normal function. The left ventricle has no regional  wall motion abnormalities. Left ventricular diastolic parameters were  normal. The average left ventricular  global longitudinal strain is -20.2 %. The global longitudinal strain is  normal.   2. Right ventricular systolic function is normal. The right ventricular  size is normal. There is normal pulmonary artery systolic pressure. The  estimated right ventricular systolic pressure is [id number].   3. The mitral valve is grossly normal. No evidence of mitral valve  regurgitation.  4. The aortic valve is tricuspid. Aortic valve regurgitation is not  visualized.   5. The inferior vena cava is normal in size with greater than 50%  respiratory variability, suggesting right atrial pressure of 3 mmHg.  - Comparison(s): No significant change from prior study. 11/25/2020: LVEF 58%, GLS -20.3%.    Past Medical History:  Diagnosis Date   Arrhythmia    paroxysmal atrial fibrillation   Cancer [facility])    Family history of breast cancer    Family history of prostate cancer     Family history of stomach cancer    Hyperlipidemia    Hypothyroidism    Thyroid disease     Past Surgical History:  Procedure Laterality Date   chin implant  1996   IR IMAGING GUIDED PORT INSERTION  11/24/2020    MEDICATIONS:  acetaminophen (TYLENOL) 500 MG tablet   ALPRAZolam (XANAX) 0.25 MG tablet   colestipol (COLESTID) 1 g tablet   fluorouracil (EFUDEX) 5 % cream   levothyroxine (SYNTHROID) 75 MCG tablet   lidocaine-prilocaine (EMLA) cream   metoprolol succinate (TOPROL-XL) 25 MG 24 hr tablet   metoprolol tartrate (LOPRESSOR) 25 MG tablet   Multiple Vitamin (MULTIVITAMIN WITH MINERALS) TABS tablet   ondansetron (ZOFRAN) 8 MG tablet   Polyethyl Glycol-Propyl Glycol (SYSTANE OP)   Probiotic Product (PROBIOTIC PO)   prochlorperazine (COMPAZINE) 10 MG tablet   Psyllium (METAMUCIL PO)   Vitamin D-Vitamin K (VITAMIN K2-VITAMIN D3 PO)   No current facility-administered medications for this encounter.    Myra Gianotti, PA-C Surgical Short Stay/Anesthesiology [facility] Phone [phone] [facility] Phone [phone] 04/09/2021 4:58 PM

## 2021-04-09 NOTE — Anesthesia Preprocedure Evaluation (Addendum)
Anesthesia Evaluation  Patient identified by MRN, date of birth, ID band Patient awake    Reviewed: Allergy & Precautions, NPO status , Patient's Chart, lab work & pertinent test results  History of Anesthesia Complications Negative for: history of anesthetic complications  Airway Mallampati: II  TM Distance: >3 FB Neck ROM: Full    Dental no notable dental hx.    Pulmonary neg pulmonary ROS,    Pulmonary exam normal        Cardiovascular Pt. on home beta blockers Normal cardiovascular exam+ dysrhythmias Atrial Fibrillation   TTE 03/08/21: EF 60-65%, valves ok   Neuro/Psych negative neurological ROS  negative psych ROS   GI/Hepatic negative GI ROS, Neg liver ROS,   Endo/Other  Hypothyroidism   Renal/GU negative Renal ROS  negative genitourinary   Musculoskeletal negative musculoskeletal ROS (+)   Abdominal   Peds  Hematology  (+) anemia , REFUSES BLOOD PRODUCTS, JEHOVAH'S WITNESSHgb 10.4   Anesthesia Other Findings Left breast ca  Reproductive/Obstetrics negative OB ROS                           Anesthesia Physical Anesthesia Plan  ASA: 3  Anesthesia Plan: General   Post-op Pain Management: GA combined w/ Regional for post-op pain   Induction: Intravenous  PONV Risk Score and Plan: 3 and Treatment may vary due to age or medical condition, Midazolam, Dexamethasone and Ondansetron  Airway Management Planned: LMA  Additional Equipment: None  Intra-op Plan:   Post-operative Plan: Extubation in OR  Informed Consent: I have reviewed the patients History and Physical, chart, labs and discussed the procedure including the risks, benefits and alternatives for the proposed anesthesia with the patient or authorized representative who has indicated his/her understanding and acceptance.     Dental advisory given  Plan Discussed with: CRNA  Anesthesia Plan Comments: (Patient refuses  blood products even if degree of anemia would likely result in death or permanent disability. She accepts albumin. Daiva Huge, MD)      Anesthesia Quick Evaluation

## 2021-04-15 ENCOUNTER — Encounter: Payer: Managed Care, Other (non HMO) | Admitting: Surgical

## 2021-04-15 ENCOUNTER — Ambulatory Visit
Admission: RE | Admit: 2021-04-15 | Discharge: 2021-04-15 | Disposition: A | Discharge status: Home / Self Care | Payer: Managed Care, Other (non HMO) | Source: Ambulatory Visit | Attending: General Surgery | Admitting: General Surgery

## 2021-04-15 ENCOUNTER — Ambulatory Visit (HOSPITAL_COMMUNITY): Payer: Managed Care, Other (non HMO)

## 2021-04-15 ENCOUNTER — Other Ambulatory Visit: Payer: Self-pay

## 2021-04-15 DIAGNOSIS — Z171 Estrogen receptor negative status [ER-]: Secondary | ICD-10-CM

## 2021-04-15 DIAGNOSIS — C50512 Malignant neoplasm of lower-outer quadrant of left female breast: Secondary | ICD-10-CM

## 2021-04-16 ENCOUNTER — Encounter (HOSPITAL_COMMUNITY)
Admission: RE | Disposition: A | Discharge status: Home / Self Care | Payer: Self-pay | Source: Home / Self Care | Attending: General Surgery

## 2021-04-16 ENCOUNTER — Ambulatory Visit (HOSPITAL_COMMUNITY): Payer: Managed Care, Other (non HMO) | Admitting: Anesthesiology

## 2021-04-16 ENCOUNTER — Other Ambulatory Visit: Payer: Self-pay

## 2021-04-16 ENCOUNTER — Ambulatory Visit (HOSPITAL_COMMUNITY)
Admission: RE | Admit: 2021-04-16 | Discharge: 2021-04-16 | Disposition: A | Discharge status: Home / Self Care | Payer: Managed Care, Other (non HMO) | Attending: General Surgery | Admitting: General Surgery

## 2021-04-16 ENCOUNTER — Ambulatory Visit
Admission: RE | Admit: 2021-04-16 | Discharge: 2021-04-16 | Disposition: A | Discharge status: Home / Self Care | Payer: Managed Care, Other (non HMO) | Source: Ambulatory Visit | Attending: General Surgery | Admitting: General Surgery

## 2021-04-16 ENCOUNTER — Ambulatory Visit (HOSPITAL_COMMUNITY): Payer: Managed Care, Other (non HMO) | Admitting: Vascular Surgery

## 2021-04-16 ENCOUNTER — Encounter (HOSPITAL_COMMUNITY): Payer: Self-pay | Admitting: General Surgery

## 2021-04-16 ENCOUNTER — Ambulatory Visit (HOSPITAL_COMMUNITY)
Admission: RE | Admit: 2021-04-16 | Discharge: 2021-04-16 | Disposition: A | Discharge status: Home / Self Care | Payer: Managed Care, Other (non HMO) | Source: Ambulatory Visit | Attending: General Surgery | Admitting: General Surgery

## 2021-04-16 DIAGNOSIS — Z79899 Other long term (current) drug therapy: Secondary | ICD-10-CM | POA: Insufficient documentation

## 2021-04-16 DIAGNOSIS — Z171 Estrogen receptor negative status [ER-]: Secondary | ICD-10-CM

## 2021-04-16 DIAGNOSIS — C50512 Malignant neoplasm of lower-outer quadrant of left female breast: Secondary | ICD-10-CM

## 2021-04-16 DIAGNOSIS — D242 Benign neoplasm of left breast: Secondary | ICD-10-CM | POA: Diagnosis not present

## 2021-04-16 DIAGNOSIS — C50912 Malignant neoplasm of unspecified site of left female breast: Secondary | ICD-10-CM | POA: Diagnosis present

## 2021-04-16 DIAGNOSIS — Z79890 Hormone replacement therapy (postmenopausal): Secondary | ICD-10-CM | POA: Insufficient documentation

## 2021-04-16 DIAGNOSIS — Z888 Allergy status to other drugs, medicaments and biological substances status: Secondary | ICD-10-CM | POA: Insufficient documentation

## 2021-04-16 DIAGNOSIS — D360 Benign neoplasm of lymph nodes: Secondary | ICD-10-CM | POA: Insufficient documentation

## 2021-04-16 HISTORY — PX: BREAST LUMPECTOMY WITH RADIOACTIVE SEED AND SENTINEL LYMPH NODE BIOPSY: SHX6550

## 2021-04-16 HISTORY — PX: RADIOACTIVE SEED GUIDED AXILLARY SENTINEL LYMPH NODE: SHX6735

## 2021-04-16 LAB — NO BLOOD PRODUCTS

## 2021-04-16 SURGERY — BREAST LUMPECTOMY WITH RADIOACTIVE SEED AND SENTINEL LYMPH NODE BIOPSY
Anesthesia: General | Site: Breast | Laterality: Left

## 2021-04-16 MED ORDER — ROCURONIUM BROMIDE 10 MG/ML (PF) SYRINGE
PREFILLED_SYRINGE | INTRAVENOUS | Status: AC
  Filled 2021-04-16: qty 10

## 2021-04-16 MED ORDER — LACTATED RINGERS IV SOLN: INTRAVENOUS | Status: DC

## 2021-04-16 MED ORDER — TECHNETIUM TC 99M TILMANOCEPT KIT
1.0000 | PACK | Freq: Once | INTRAVENOUS | Status: AC | PRN
  Administered 2021-04-16: 1 via INTRADERMAL

## 2021-04-16 MED ORDER — FENTANYL CITRATE (PF) 100 MCG/2ML IJ SOLN
INTRAMUSCULAR | Status: AC
  Administered 2021-04-16: 50 ug via INTRAVENOUS
  Filled 2021-04-16: qty 2

## 2021-04-16 MED ORDER — MIDAZOLAM HCL 2 MG/2ML IJ SOLN: 1.0000 mg | Freq: Once | INTRAMUSCULAR | Status: AC

## 2021-04-16 MED ORDER — CELECOXIB 200 MG PO CAPS
200.0000 mg | ORAL_CAPSULE | ORAL | Status: AC
  Administered 2021-04-16: 200 mg via ORAL
  Filled 2021-04-16: qty 1

## 2021-04-16 MED ORDER — MIDAZOLAM HCL 2 MG/2ML IJ SOLN
INTRAMUSCULAR | Status: AC
  Administered 2021-04-16: 1 mg via INTRAVENOUS
  Filled 2021-04-16: qty 2

## 2021-04-16 MED ORDER — CHLORHEXIDINE GLUCONATE CLOTH 2 % EX PADS: 6.0000 | MEDICATED_PAD | Freq: Once | CUTANEOUS | Status: DC

## 2021-04-16 MED ORDER — BUPIVACAINE LIPOSOME 1.3 % IJ SUSP
INTRAMUSCULAR | Status: DC | PRN
  Administered 2021-04-16: 10 mL via PERINEURAL

## 2021-04-16 MED ORDER — PHENYLEPHRINE HCL-NACL 20-0.9 MG/250ML-% IV SOLN
INTRAVENOUS | Status: DC | PRN
  Administered 2021-04-16: 25 ug/min via INTRAVENOUS

## 2021-04-16 MED ORDER — FENTANYL CITRATE (PF) 100 MCG/2ML IJ SOLN
50.0000 ug | Freq: Once | INTRAMUSCULAR | Status: AC
Start: 2021-04-16 — End: 2021-04-16

## 2021-04-16 MED ORDER — PHENYLEPHRINE 40 MCG/ML (10ML) SYRINGE FOR IV PUSH (FOR BLOOD PRESSURE SUPPORT)
PREFILLED_SYRINGE | INTRAVENOUS | Status: AC
  Filled 2021-04-16: qty 20

## 2021-04-16 MED ORDER — LIDOCAINE 2% (20 MG/ML) 5 ML SYRINGE
INTRAMUSCULAR | Status: DC | PRN
  Administered 2021-04-16: 80 mg via INTRAVENOUS

## 2021-04-16 MED ORDER — CHLORHEXIDINE GLUCONATE 0.12 % MT SOLN: 15.0000 mL | Freq: Once | OROMUCOSAL | Status: AC

## 2021-04-16 MED ORDER — FENTANYL CITRATE (PF) 250 MCG/5ML IJ SOLN
INTRAMUSCULAR | Status: AC
  Filled 2021-04-16: qty 5

## 2021-04-16 MED ORDER — LIDOCAINE 2% (20 MG/ML) 5 ML SYRINGE
INTRAMUSCULAR | Status: AC
  Filled 2021-04-16: qty 10

## 2021-04-16 MED ORDER — CHLORHEXIDINE GLUCONATE 0.12 % MT SOLN
OROMUCOSAL | Status: AC
  Administered 2021-04-16: 15 mL via OROMUCOSAL
  Filled 2021-04-16: qty 15

## 2021-04-16 MED ORDER — FENTANYL CITRATE (PF) 250 MCG/5ML IJ SOLN
INTRAMUSCULAR | Status: DC | PRN
  Administered 2021-04-16: 50 ug via INTRAVENOUS
  Administered 2021-04-16 (×2): 25 ug via INTRAVENOUS

## 2021-04-16 MED ORDER — ONDANSETRON HCL 4 MG/2ML IJ SOLN
INTRAMUSCULAR | Status: DC | PRN
  Administered 2021-04-16: 4 mg via INTRAVENOUS

## 2021-04-16 MED ORDER — MIDAZOLAM HCL 2 MG/2ML IJ SOLN
INTRAMUSCULAR | Status: DC | PRN
  Administered 2021-04-16: 1 mg via INTRAVENOUS

## 2021-04-16 MED ORDER — PROPOFOL 10 MG/ML IV BOLUS
INTRAVENOUS | Status: AC
  Filled 2021-04-16: qty 20

## 2021-04-16 MED ORDER — PROPOFOL 10 MG/ML IV BOLUS
INTRAVENOUS | Status: DC | PRN
  Administered 2021-04-16: 140 mg via INTRAVENOUS

## 2021-04-16 MED ORDER — HYDROCODONE-ACETAMINOPHEN 5-325 MG PO TABS: 1.0000 | ORAL_TABLET | Freq: Four times a day (QID) | ORAL | 0 refills | Status: DC | PRN

## 2021-04-16 MED ORDER — ORAL CARE MOUTH RINSE: 15.0000 mL | Freq: Once | OROMUCOSAL | Status: AC

## 2021-04-16 MED ORDER — GABAPENTIN 300 MG PO CAPS
300.0000 mg | ORAL_CAPSULE | ORAL | Status: AC
  Administered 2021-04-16: 300 mg via ORAL
  Filled 2021-04-16: qty 1

## 2021-04-16 MED ORDER — PHENYLEPHRINE HCL (PRESSORS) 10 MG/ML IV SOLN
INTRAVENOUS | Status: DC | PRN
  Administered 2021-04-16: 80 ug via INTRAVENOUS

## 2021-04-16 MED ORDER — MIDAZOLAM HCL 2 MG/2ML IJ SOLN
INTRAMUSCULAR | Status: AC
  Filled 2021-04-16: qty 2

## 2021-04-16 MED ORDER — DEXAMETHASONE SODIUM PHOSPHATE 10 MG/ML IJ SOLN
INTRAMUSCULAR | Status: AC
  Filled 2021-04-16: qty 2

## 2021-04-16 MED ORDER — ONDANSETRON HCL 4 MG/2ML IJ SOLN
INTRAMUSCULAR | Status: AC
  Filled 2021-04-16: qty 4

## 2021-04-16 MED ORDER — 0.9 % SODIUM CHLORIDE (POUR BTL) OPTIME
TOPICAL | Status: DC | PRN
  Administered 2021-04-16: 1000 mL

## 2021-04-16 MED ORDER — CEFAZOLIN SODIUM-DEXTROSE 2-4 GM/100ML-% IV SOLN
2.0000 g | INTRAVENOUS | Status: AC
  Administered 2021-04-16: 2 g via INTRAVENOUS
  Filled 2021-04-16: qty 100

## 2021-04-16 MED ORDER — BUPIVACAINE-EPINEPHRINE (PF) 0.5% -1:200000 IJ SOLN
INTRAMUSCULAR | Status: DC | PRN
  Administered 2021-04-16: 20 mL via PERINEURAL

## 2021-04-16 MED ORDER — ACETAMINOPHEN 500 MG PO TABS
1000.0000 mg | ORAL_TABLET | ORAL | Status: AC
Start: 2021-04-16 — End: 2021-04-16
  Administered 2021-04-16: 1000 mg via ORAL
  Filled 2021-04-16: qty 2

## 2021-04-16 MED ORDER — BUPIVACAINE-EPINEPHRINE (PF) 0.25% -1:200000 IJ SOLN
INTRAMUSCULAR | Status: AC
  Filled 2021-04-16: qty 30

## 2021-04-16 MED ORDER — BUPIVACAINE-EPINEPHRINE 0.25% -1:200000 IJ SOLN
INTRAMUSCULAR | Status: DC | PRN
  Administered 2021-04-16: 10 mL
  Administered 2021-04-16: 15 mL

## 2021-04-16 MED ORDER — DEXAMETHASONE SODIUM PHOSPHATE 10 MG/ML IJ SOLN
INTRAMUSCULAR | Status: DC | PRN
  Administered 2021-04-16: 10 mg via INTRAVENOUS

## 2021-04-16 SURGICAL SUPPLY — 41 items
APPLIER CLIP 9.375 MED OPEN (MISCELLANEOUS) ×2
BLADE SURG 15 STRL LF DISP TIS (BLADE) ×1 IMPLANT
BLADE SURG 15 STRL SS (BLADE) ×2
CANISTER SUC SOCK COL 7IN (MISCELLANEOUS) IMPLANT
CANISTER SUCT 1200ML W/VALVE (MISCELLANEOUS) IMPLANT
CHLORAPREP W/TINT 26 (MISCELLANEOUS) ×2 IMPLANT
CLIP APPLIE 9.375 MED OPEN (MISCELLANEOUS) ×1 IMPLANT
COVER BACK TABLE 60X90IN (DRAPES) ×2 IMPLANT
COVER MAYO STAND STRL (DRAPES) ×2 IMPLANT
COVER PROBE W GEL 5X96 (DRAPES) ×2 IMPLANT
DECANTER SPIKE VIAL GLASS SM (MISCELLANEOUS) IMPLANT
DERMABOND ADVANCED (GAUZE/BANDAGES/DRESSINGS) ×2
DERMABOND ADVANCED .7 DNX12 (GAUZE/BANDAGES/DRESSINGS) ×2 IMPLANT
DRAPE LAPAROSCOPIC ABDOMINAL (DRAPES) ×2 IMPLANT
DRAPE UTILITY XL STRL (DRAPES) ×2 IMPLANT
ELECT COATED BLADE 2.86 ST (ELECTRODE) ×2 IMPLANT
ELECT REM PT RETURN 9FT ADLT (ELECTROSURGICAL) ×2
ELECTRODE REM PT RTRN 9FT ADLT (ELECTROSURGICAL) ×1 IMPLANT
GLOVE SURG ENC MOIS LTX SZ7.5 (GLOVE) ×2 IMPLANT
GOWN STRL REUS W/ TWL LRG LVL3 (GOWN DISPOSABLE) ×2 IMPLANT
GOWN STRL REUS W/TWL LRG LVL3 (GOWN DISPOSABLE) ×4
ILLUMINATOR WAVEGUIDE N/F (MISCELLANEOUS) IMPLANT
KIT MARKER MARGIN INK (KITS) ×2 IMPLANT
LIGHT WAVEGUIDE WIDE FLAT (MISCELLANEOUS) IMPLANT
NDL SAFETY ECLIPSE 18X1.5 (NEEDLE) IMPLANT
NEEDLE HYPO 18GX1.5 SHARP (NEEDLE)
NEEDLE HYPO 25X1 1.5 SAFETY (NEEDLE) ×2 IMPLANT
NS IRRIG 1000ML POUR BTL (IV SOLUTION) IMPLANT
PACK BASIN DAY SURGERY FS (CUSTOM PROCEDURE TRAY) ×2 IMPLANT
PENCIL SMOKE EVACUATOR (MISCELLANEOUS) ×2 IMPLANT
SLEEVE SCD COMPRESS KNEE MED (STOCKING) ×2 IMPLANT
SPONGE T-LAP 18X18 ~~LOC~~+RFID (SPONGE) ×2 IMPLANT
SUT MON AB 4-0 PC3 18 (SUTURE) ×4 IMPLANT
SUT SILK 2 0 SH (SUTURE) IMPLANT
SUT VIC AB 3-0 SH 8-18 (SUTURE) ×2 IMPLANT
SUT VICRYL 3-0 CR8 SH (SUTURE) ×2 IMPLANT
SYR CONTROL 10ML LL (SYRINGE) ×2 IMPLANT
TOWEL GREEN STERILE FF (TOWEL DISPOSABLE) ×2 IMPLANT
TRAY FAXITRON CT DISP (TRAY / TRAY PROCEDURE) ×2 IMPLANT
TUBE CONNECTING 20X1/4 (TUBING) IMPLANT
YANKAUER SUCT BULB TIP NO VENT (SUCTIONS) IMPLANT

## 2021-04-16 NOTE — Anesthesia Procedure Notes (Signed)
Anesthesia Regional Block: Pectoralis block   Pre-Anesthetic Checklist: , timeout performed,  Correct Patient, Correct Site, Correct Laterality,  Correct Procedure, Correct Position, site marked,  Risks and benefits discussed,  Pre-op evaluation,  At surgeon's request and post-op pain management  Laterality: Left  Prep: Maximum Sterile Barrier Precautions used, chloraprep       Needles:  Injection technique: Single-shot  Needle Type: Echogenic Stimulator Needle     Needle Length: 9cm  Needle Gauge: 22     Additional Needles:   Procedures:,,,, ultrasound used (permanent image in chart),,    Narrative:  Start time: 04/16/2021 8:55 AM End time: 04/16/2021 8:58 AM Injection made incrementally with aspirations every 5 mL.  Performed by: Personally  Anesthesiologist: Brennan Bailey, MD  Additional Notes: Risks, benefits, and alternative discussed. Patient gave consent for procedure. Patient prepped and draped in sterile fashion. Sedation administered, patient remains easily responsive to voice. Relevant anatomy identified with ultrasound guidance. Local anesthetic given in 5cc increments with no signs or symptoms of intravascular injection. No pain or paraesthesias with injection. Patient monitored throughout procedure with signs of LAST or immediate complications. Tolerated well. Ultrasound image placed in chart.  Tawny Asal, MD

## 2021-04-16 NOTE — Op Note (Signed)
04/16/2021  11:40 AM  PATIENT:  [name]  55 y.o. female  PRE-OPERATIVE DIAGNOSIS:  LEFT BREAST CANCER  POST-OPERATIVE DIAGNOSIS:  LEFT BREAST CANCER  PROCEDURE:  Procedure(s): LEFT BREAST LUMPECTOMY WITH RADIOACTIVE SEED LOCALIZATION AND DEEP LEFT AXILLARY SENTINEL LYMPH NODE BIOPSY WITH RADIOACTIVE SEED GUIDED LEFT AXILLARY TARGETED LYMPH NODE DISSECTION (Left)  SURGEON:  Surgeon(s) and Role:    * Jovita Kussmaul, MD - Primary  PHYSICIAN ASSISTANT:   ASSISTANTS: none   ANESTHESIA:   local and general  EBL:  10 mL   BLOOD ADMINISTERED:none  DRAINS: none   LOCAL MEDICATIONS USED:  MARCAINE     SPECIMEN:  Source of Specimen:  left breast tissue with sentinel nodes x 2 and targeted node dissection  DISPOSITION OF SPECIMEN:  PATHOLOGY  COUNTS:  YES  TOURNIQUET:  * No tourniquets in log *  DICTATION: .Dragon Dictation  After informed consent was obtained the patient was brought to the operating room and placed in the supine position on the operating table.  After adequate induction of general anesthesia the patient's left chest, breast, and axillary area were prepped with ChloraPrep, allowed to dry, and draped in usual sterile manner.  An appropriate timeout was performed.  Previously an I-125 seed was placed in the lower outer quadrant of the left breast to mark an area of invasive breast cancer.  The patient also had a second I-125 seed placed in the left axilla to mark a previously positive lymph node.  Earlier in the day the patient also underwent injection 1 mCi of technetium sulfur colloid in the subareolar position on the left.  I also placed 2 cc of iron oxide in the subareolar plexus of the left breast.  The breast was massaged for 5 minutes.  Attention was first turned to the left axilla.  The neoprobe was set to I-125 and there was a good radioactive signal.  The area overlying this was infiltrated with quarter percent Marcaine.  A transversely oriented incision was  made with a 15 blade knife overlying the area of radioactivity.  The incision was carried through the skin and subcutaneous tissue sharply with the electrocautery until the deep left axillary space was entered.  The neoprobe was used to direct blunt hemostat dissection.  I was able to identify the lymph node with the radioactive seed.  This was excised sharply with the electrocautery and the surrounding small vessels and lymphatics were controlled with clips.  A specimen radiograph confirmed the presence of the clip and seed.  This was sent as the targeted node.  The neoprobe was then set to technetium and the mag trace was also used to identify an additional hot lymph node.  This was excised sharply with the electrocautery and the surrounding small vessels and lymphatics were controlled with clips.  Ex vivo counts on this node were approximately 200 with the neoprobe and 800 with the mag trace.  This was sent as sentinel node #1.  An additional palpable node was identified that was also removed in a similar fashion.  This node had no signals.  This was sent as sentinel node #2.  No other hot or palpable nodes were identified in the left axilla.  Hemostasis was achieved using the Bovie electrocautery.  The deep layer of the incision was then closed with interrupted 3-0 Vicryl stitches.  The skin was closed with a running 4-0 Monocryl subcuticular stitch.  Attention was then turned to the left breast.  The neoprobe was set  to I-125 again in the area of radioactivity was readily identified very far in the lower outer quadrant.  The area around this was infiltrated with quarter percent Marcaine.  A curvilinear incision was made with a 15 blade knife along the inframammary fold.  The incision was carried through the skin and subcutaneous tissue sharply with the electrocautery until the chest wall was encountered.  The dissection was then carried out between the breast tissue and the muscle of the chest wall superiorly  until the dissection was well beyond the area of the cancer.  Next the dissection was carried anteriorly between the breast tissue and the subcutaneous fat and skin until the dissection was well beyond the area of the cancer.  I then removed a wedge of breast tissue sharply with the electrocautery around the radioactive seed while checking the area of radioactivity frequently.  Once the specimen was removed it was oriented with the appropriate paint colors.  A specimen radiograph was obtained that showed the clip and seed to be in the center of the specimen.  The specimen was then sent to pathology for further evaluation.  Hemostasis was achieved using the Bovie electrocautery.  The cavity was infiltrated with more quarter percent Marcaine.  The cavity was marked with clips.  The deep layer of the wound was then closed with layers of interrupted 3-0 Vicryl stitches.  The skin was then closed with a running 4-0 Monocryl subcuticular stitch.  Dermabond dressings were applied.  The patient tolerated the procedure well.  At the end of the case all needle sponge and instrument counts were correct.  The patient was then awakened and taken to recovery in stable condition.  PLAN OF CARE: Discharge to home after PACU  PATIENT DISPOSITION:  PACU - hemodynamically stable.   Delay start of Pharmacological VTE agent (>24hrs) due to surgical blood loss or risk of bleeding: not applicable

## 2021-04-16 NOTE — H&P (Signed)
PROVIDER:  Landry Corporal, MD   MRN: [id number] DOB: 02-Aug-1966 Subjective    Chief Complaint: Follow-up       History of Present Illness: Kristina Harvey is a 55 y.o. female who is seen today for left breast cancer.  The patient is a 55 year old white female who was diagnosed with a 1.4 cm cancer in the lower outer quadrant of the left breast with 2 positive lymph nodes back in March.  The cancer was ER and PR negative and HER2 positive with a Ki-67 of 20%.  She received neoadjuvant chemotherapy and has had a good response.  There is no longer any enhancement in the breast and the 2 lymph nodes have shrunk significantly.  She is now ready to schedule her definitive surgery.       Review of Systems: A complete review of systems was obtained from the patient.  I have reviewed this information and discussed as appropriate with the patient.  See HPI as well for other ROS.   ROS      Medical History: Past Medical History  History reviewed. No pertinent past medical history.        Patient Active Problem List  Diagnosis   Malignant neoplasm of lower-outer quadrant of left breast of female, estrogen receptor negative (CMS-HCC)      Past Surgical History  History reviewed. No pertinent surgical history.      Allergies      Allergies  Allergen Reactions   Caffeine Palpitations   Cyclobenzaprine Rash              Current Outpatient Medications on File Prior to Visit  Medication Sig Dispense Refill   ALPRAZolam (XANAX) 0.25 MG tablet alprazolam 0.25 mg tablet       clindamycin (CLEOCIN T) 1 % lotion         colestipoL (COLESTID) 1 gram tablet colestipol 1 gram tablet       hydrocortisone 2.5 % cream hydrocortisone 2.5 % topical cream with perineal applicator       levothyroxine (SYNTHROID) 75 MCG tablet levothyroxine 75 mcg tablet       metoprolol succinate (TOPROL-XL) 25 MG XL tablet metoprolol succinate ER 25 mg tablet,extended release 24 hr       metoprolol tartrate  (LOPRESSOR) 25 MG tablet metoprolol tartrate 25 mg tablet        No current facility-administered medications on file prior to visit.      Family History  History reviewed. No pertinent family history.      Social History       Tobacco Use  Smoking Status Never Smoker  Smokeless Tobacco Never Used      Social History  Social History        Socioeconomic History   Marital status: Married  Tobacco Use   Smoking status: Never Smoker   Smokeless tobacco: Never Used  Substance and Sexual Activity   Alcohol use: Yes   Drug use: Never        Objective:         Vitals:     BP: 130/70  Pulse: 92  Temp: 36.7 C (98 F)  SpO2: 100%  Weight: 73.9 kg (163 lb)  Height: 171.5 cm (5' 7.5")    Body mass index is 25.15 kg/m.   Physical Exam Vitals reviewed.  Constitutional:      General: She is not in acute distress.    Appearance: Normal appearance.  HENT:     Head: Normocephalic and atraumatic.  Right Ear: External ear normal.     Left Ear: External ear normal.     Nose: Nose normal.     Mouth/Throat:     Mouth: Mucous membranes are moist.     Pharynx: Oropharynx is clear.  Eyes:     General: No scleral icterus.    Extraocular Movements: Extraocular movements intact.     Conjunctiva/sclera: Conjunctivae normal.     Pupils: Pupils are equal, round, and reactive to light.  Cardiovascular:     Rate and Rhythm: Normal rate and regular rhythm.     Pulses: Normal pulses.     Heart sounds: Normal heart sounds.  Pulmonary:     Effort: Pulmonary effort is normal. No respiratory distress.     Breath sounds: Normal breath sounds.  Abdominal:     General: Bowel sounds are normal.     Palpations: Abdomen is soft.     Tenderness: There is no abdominal tenderness.  Musculoskeletal:        General: No swelling, tenderness or deformity. Normal range of motion.     Cervical back: Normal range of motion and neck supple.  Skin:    General: Skin is warm and dry.      Coloration: Skin is not jaundiced.  Neurological:     General: No focal deficit present.     Mental Status: She is alert and oriented to person, place, and time.  Psychiatric:        Mood and Affect: Mood normal.        Behavior: Behavior normal.    Breast: There is now no palpable mass in either breast.  There is no palpable axillary, supraclavicular, or cervical lymphadenopathy.         Labs, Imaging and Diagnostic Testing:         Assessment and Plan:  Diagnoses and all orders for this visit:   Malignant neoplasm of lower-outer quadrant of left breast of female, estrogen receptor negative (CMS-HCC) -     CCS Case Posting Request; Future       The patient has a known cancer in the lower outer quadrant of the left breast with 2 positive lymph nodes.  She has had a very good response to neoadjuvant chemotherapy and is now ready to schedule definitive surgery.  I have discussed with her the different options and at this point she is in favor of breast conservation with sentinel node mapping and targeted node dissection which I feel is very reasonable and appropriate.  I have discussed with her in detail the risks and benefits of the operation as well as some of the technical aspects including the use of radioactive seeds for localization and she understands and wishes to proceed.

## 2021-04-16 NOTE — Anesthesia Postprocedure Evaluation (Signed)
Anesthesia Post Note  Patient: Kristina Harvey  Procedure(s) Performed: LEFT BREAST LUMPECTOMY WITH RADIOACTIVE SEED AND SENTINEL LYMPH NODE BIOPSY (Left: Breast) RADIOACTIVE SEED GUIDED AXILLARY SENTINEL LYMPH NODE DISSECTION (Left: Breast)     Patient location during evaluation: PACU Anesthesia Type: General Level of consciousness: awake and alert and oriented Pain management: pain level controlled Vital Signs Assessment: post-procedure vital signs reviewed and stable Respiratory status: spontaneous breathing, nonlabored ventilation and respiratory function stable Cardiovascular status: blood pressure returned to baseline Postop Assessment: no apparent nausea or vomiting Anesthetic complications: no   No notable events documented.  Last Vitals:  Vitals:   04/16/21 1205 04/16/21 1219  BP: 136/83 140/90  Pulse: 77 83  Resp: 14 13  Temp:  36.4 C  SpO2: 99% 99%    Last Pain:  Vitals:   04/16/21 1219  TempSrc:   PainSc: 0-No pain                 Marthenia Rolling

## 2021-04-16 NOTE — Transfer of Care (Signed)
Immediate Anesthesia Transfer of Care Note  Patient: Kristina Harvey  Procedure(s) Performed: LEFT BREAST LUMPECTOMY WITH RADIOACTIVE SEED AND SENTINEL LYMPH NODE BIOPSY (Left: Breast) RADIOACTIVE SEED GUIDED AXILLARY SENTINEL LYMPH NODE DISSECTION (Left: Breast)  Patient Location: PACU  Anesthesia Type:General  Level of Consciousness: awake, alert  and oriented  Airway & Oxygen Therapy: Patient Spontanous Breathing  Post-op Assessment: Report given to RN and Post -op Vital signs reviewed and stable  Post vital signs: Reviewed and stable  Last Vitals:  Vitals Value Taken Time  BP 128/53 04/16/21 1150  Temp    Pulse 81 04/16/21 1150  Resp 16 04/16/21 1150  SpO2 97 % 04/16/21 1150  Vitals shown include unvalidated device data.  Last Pain:  Vitals:   04/16/21 0845  TempSrc:   PainSc: 0-No pain      Patients Stated Pain Goal: 3 ([id number])  Complications: No notable events documented.

## 2021-04-16 NOTE — Interval H&P Note (Signed)
History and Physical Interval Note:  04/16/2021 8:35 AM  Kristina Harvey  has presented today for surgery, with the diagnosis of LEFT BREAST CANCER.  The various methods of treatment have been discussed with the patient and family. After consideration of risks, benefits and other options for treatment, the patient has consented to  Procedure(s): LEFT BREAST LUMPECTOMY WITH RADIOACTIVE SEED AND SENTINEL LYMPH NODE BIOPSY (Left) RADIOACTIVE SEED GUIDED AXILLARY SENTINEL LYMPH NODE DISSECTION (Left) as a surgical intervention.  The patient's history has been reviewed, patient examined, no change in status, stable for surgery.  I have reviewed the patient's chart and labs.  Questions were answered to the patient's satisfaction.     Autumn Messing III

## 2021-04-16 NOTE — Anesthesia Procedure Notes (Signed)
Procedure Name: LMA Insertion Date/Time: 04/16/2021 10:03 AM Performed by: Clearnce Sorrel, CRNA Pre-anesthesia Checklist: Patient identified, Emergency Drugs available, Suction available and Patient being monitored Patient Re-evaluated:Patient Re-evaluated prior to induction Oxygen Delivery Method: Circle System Utilized Preoxygenation: Pre-oxygenation with 100% oxygen Induction Type: IV induction Ventilation: Mask ventilation without difficulty LMA: LMA inserted LMA Size: 4.0 Number of attempts: 1 Airway Equipment and Method: Bite block Placement Confirmation: positive ETCO2 Tube secured with: Tape Dental Injury: Teeth and Oropharynx as per pre-operative assessment

## 2021-04-17 ENCOUNTER — Encounter (HOSPITAL_COMMUNITY): Payer: Self-pay | Admitting: General Surgery

## 2021-04-20 ENCOUNTER — Encounter: Payer: Self-pay | Admitting: *Deleted

## 2021-04-20 NOTE — Progress Notes (Signed)
Received fax from [facility] providing contact information to facilitate pt in any future needs such as home injections, infusions, and DME.  RN can be contacted at [phone] ext [phone].

## 2021-04-22 ENCOUNTER — Encounter: Payer: Self-pay | Admitting: *Deleted

## 2021-04-22 LAB — SURGICAL PATHOLOGY

## 2021-04-24 NOTE — Progress Notes (Signed)
Patient Care Team: Alroy Dust, L.Marlou Sa, MD as PCP - General (Family Medicine) Jovita Kussmaul, MD as Consulting Physician (General Surgery) Nicholas Lose, MD as Consulting Physician (Hematology and Oncology) Gery Pray, MD as Consulting Physician (Radiation Oncology) Mauro Kaufmann, RN as Oncology Nurse Navigator Rockwell Germany, RN as Oncology Nurse Navigator  DIAGNOSIS:    ICD-10-CM   1. Malignant neoplasm of lower-outer quadrant of left breast of female, estrogen receptor negative ([place])  C50.512    Z17.1       SUMMARY OF ONCOLOGIC HISTORY: Oncology History  Malignant neoplasm of lower-outer quadrant of left breast of female, estrogen receptor negative ([place])  11/04/2020 Initial Diagnosis   Screening mammogram showed a possible left breast mass and enlarged axillary lymph nodes. Diagnostic mammogram and US showed a 1.4cm mass at the 3:30 position in the left breast and 2 abnormal lymph nodes in the left axilla. 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   11/11/2020 Cancer Staging   Staging form: Breast, AJCC 8th Edition - Clinical stage from 11/11/2020: Stage IIA (cT1c, cN1, cM0, G3, ER-, PR-, HER2+) - Signed by Nicholas Lose, MD on 11/11/2020 Stage prefix: Initial diagnosis Histologic grading system: 3 grade system Laterality: Left Staged by: Pathologist and managing physician Stage used in treatment planning: Yes National guidelines used in treatment planning: Yes Type of national guideline used in treatment planning: NCCN   11/27/2020 -  Chemotherapy    Patient is on Treatment Plan: BREAST  DOCETAXEL + CARBOPLATIN + TRASTUZUMAB + PERTUZUMAB  (TCHP) Q21D        04/16/2021 Surgery   Left lumpectomy 04/16/2021: Pathologic complete response, 0/3 lymph nodes negative     CHIEF COMPLIANT: Follow-up of breast cancer  INTERVAL HISTORY: Kristina Harvey is a 55 y.o. with above-mentioned history of  breast cancer currently on neoadjuvant chemotherapy with Presho. Left breast lumpectomy on 04/16/2021 showed no residual carcinoma with no carcinoma identified in left axillary lymph nodes.  She is complaining of soreness from recent breast surgery but otherwise doing quite well.  She is very happy about the pathologic complete response.  ALLERGIES:  is allergic to caffeine.  MEDICATIONS:  Current Outpatient Medications  Medication Sig Dispense Refill   acetaminophen (TYLENOL) 500 MG tablet Take 1,000 mg by mouth every 6 (six) hours as needed for moderate pain.     ALPRAZolam (XANAX) 0.25 MG tablet Take 0.25 mg by mouth at bedtime as needed for sleep.     colestipol (COLESTID) 1 g tablet Take 2 g by mouth at bedtime.     fluorouracil (EFUDEX) 5 % cream Apply 1 application topically daily.     HYDROcodone-acetaminophen (NORCO/VICODIN) 5-325 MG tablet Take 1 tablet by mouth every 6 (six) hours as needed for moderate pain or severe pain. 15 tablet 0   levothyroxine (SYNTHROID) 75 MCG tablet Take 75 mcg by mouth daily before breakfast.     lidocaine-prilocaine (EMLA) cream Apply to affected area once (Patient taking differently: Apply 1 application topically daily as needed (port access).) 30 g 3   metoprolol succinate (TOPROL-XL) 25 MG 24 hr tablet TAKE ONE TABLET BY MOUTH DAILY WITH OR IMMEDIATELY FOLLOWING A MEAL (Patient taking differently: Take 25 mg by mouth daily.) 90 tablet 1   metoprolol tartrate (LOPRESSOR) 25 MG tablet Take 25 mg by mouth daily as needed (palpitations).     Multiple Vitamin (MULTIVITAMIN WITH MINERALS) TABS tablet Take 1 tablet  by mouth daily.     Polyethyl Glycol-Propyl Glycol (SYSTANE OP) Place 1 drop into both eyes 2 (two) times daily.     Probiotic Product (PROBIOTIC PO) Take 1 capsule by mouth daily.     Psyllium (METAMUCIL PO) Take 1 Scoop by mouth daily.     Vitamin D-Vitamin K (VITAMIN K2-VITAMIN D3 PO) Take 1 tablet by mouth daily.     No current  facility-administered medications for this visit.    PHYSICAL EXAMINATION: ECOG PERFORMANCE STATUS: 1 - Symptomatic but completely ambulatory  Vitals:   04/26/21 1153  BP: 128/65  Pulse: 63  Resp: 18  Temp: 97.9 F (36.6 C)  SpO2: 99%   Filed Weights   04/26/21 1153  Weight: 161 lb 11.2 oz (73.3 kg)    BREAST: No palpable masses or nodules in either right or left breasts. No palpable axillary supraclavicular or infraclavicular adenopathy no breast tenderness or nipple discharge. (exam performed in the presence of a chaperone)  LABORATORY DATA:  I have reviewed the data as listed CMP Latest Ref Rng & Units 04/02/2021 03/12/2021 02/19/2021  Glucose 70 - 99 mg/dL 99 151(H) 89  BUN 6 - 20 mg/dL 12 9 17   Creatinine 0.44 - 1.00 mg/dL 0.67 0.62 0.61  Sodium 135 - 145 mmol/L 139 140 139  Potassium 3.5 - 5.1 mmol/L 4.2 3.6 3.8  Chloride 98 - 111 mmol/L 105 107 105  CO2 22 - 32 mmol/L 23 24 27   Calcium 8.9 - 10.3 mg/dL 9.1 9.0 9.1  Total Protein 6.5 - 8.1 g/dL 6.6 6.6 6.8  Total Bilirubin 0.3 - 1.2 mg/dL 0.5 0.3 0.5  Alkaline Phos 38 - 126 U/L 63 72 66  AST 15 - 41 U/L 26 17 19   ALT 0 - 44 U/L 15 15 17     Lab Results  Component Value Date   WBC 6.0 04/02/2021   HGB 10.4 (L) 04/02/2021   HCT 30.8 (L) 04/02/2021   MCV 110.4 (H) 04/02/2021   PLT 221 04/02/2021   NEUTROABS 3.7 04/02/2021    ASSESSMENT & PLAN:  Malignant neoplasm of lower-outer quadrant of left breast of female, estrogen receptor negative ([place]) 11/04/2020:Screening mammogram showed a possible left breast mass and enlarged axillary lymph nodes. Diagnostic mammogram and US showed a 1.4cm mass at the 3:30 position in the left breast and 2 abnormal lymph nodes in the left axilla. 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   Treatment Plan: 1. Neoadjuvant chemotherapy with TCH Perjeta 6 cycles completed 03/12/2021 followed by  Herceptin Perjeta maintenance versus Kadcyla maintenance (based on response to neoadjuvant chemo) for 1 year 2. Followed by breast conserving surgery if possible with targeted node dissection versus ALND 3. Followed by adjuvant radiation therapy 4.  Consideration for neratinib ------------------------------------------------------------------------------------------------------------------------------- Current Treatment: Completed 6 cycles of (11/27/20-03/12/2021), currently on Herceptin Perjeta maintenance  Left lumpectomy 04/16/2021: Pathologic complete response, 0/3 lymph nodes negative   Return to clinic for Herceptin Perjeta maintenance She has an appoint with radiation oncology coming up.  Every 3 weeks for Herceptin Perjeta every 6 weeks to follow-up with me.   No orders of the defined types were placed in this encounter.  The patient has a good understanding of the overall plan. she agrees with it. she will call with any problems that may develop before the next visit here.  Total time spent: 20 mins including face to face time and time spent for  planning, charting and coordination of care  Rulon Eisenmenger, MD, MPH 04/26/2021  I, Thana Ates, am acting as scribe for Dr. Nicholas Lose.  I have reviewed the above documentation for accuracy and completeness, and I agree with the above.

## 2021-04-26 ENCOUNTER — Other Ambulatory Visit: Payer: Self-pay

## 2021-04-26 ENCOUNTER — Inpatient Hospital Stay: Payer: Managed Care, Other (non HMO) | Attending: Hematology and Oncology | Admitting: Hematology and Oncology

## 2021-04-26 VITALS — BP 128/65 | HR 63 | Temp 97.9°F | Resp 18 | Wt 161.7 lb

## 2021-04-26 DIAGNOSIS — C50512 Malignant neoplasm of lower-outer quadrant of left female breast: Secondary | ICD-10-CM | POA: Insufficient documentation

## 2021-04-26 DIAGNOSIS — Z5112 Encounter for antineoplastic immunotherapy: Secondary | ICD-10-CM | POA: Diagnosis present

## 2021-04-26 DIAGNOSIS — Z171 Estrogen receptor negative status [ER-]: Secondary | ICD-10-CM | POA: Diagnosis not present

## 2021-04-26 NOTE — Progress Notes (Signed)
The following biosimilar Kanjinti (trastuzumab-anns) has been selected for use in this patient.  Kennith Center, Pharm.D., CPP 04/26/2021'@4'$ :18 PM

## 2021-04-26 NOTE — Assessment & Plan Note (Signed)
11/04/2020:Screening mammogram showed a possible left breast mass and enlarged axillary lymph nodes. Diagnostic mammogram and US showed a 1.4cm mass at the 3:30 position in the left breast and 2 abnormal lymph nodes in the left axilla. 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  Treatment Plan: 1.Neoadjuvant chemotherapy with TCH Perjeta 6 cycles completed 03/12/2021 followed by Herceptin Perjeta maintenance versus Kadcyla maintenance (based on response to neoadjuvant chemo) for 1 year 2. Followed by breast conserving surgery if possible withtargeted node dissection versus ALND 3. Followed by adjuvant radiation therapy 4.Consideration for neratinib ------------------------------------------------------------------------------------------------------------------------------- Current Treatment: Completed 6 cycles of (11/27/20-03/12/2021), currently on Herceptin Perjeta maintenance  Chemo Toxicities: 1.Severe diarrhea:Diarrhea is now more manageable with probiotics and colestipol along with Metamucil. 2.anemia: Monitoring her hemoglobintoday is 10.4 3.Alopecia 4. Mild fatigue No neuropathy  Breast MRI 03/15/2021: Marked interval improvement in the appearance of breast cancer with no residual mass or enhancement, both of the abnormal lymph nodes improved in size and appearance.  Left lumpectomy 04/16/2021: Pathologic complete response, 0/3 lymph nodes negative  Return to clinic for Herceptin Perjeta maintenance

## 2021-05-03 ENCOUNTER — Other Ambulatory Visit: Payer: Self-pay

## 2021-05-03 ENCOUNTER — Inpatient Hospital Stay: Payer: Managed Care, Other (non HMO)

## 2021-05-03 VITALS — BP 132/72 | HR 62 | Temp 98.1°F | Resp 18

## 2021-05-03 DIAGNOSIS — Z5112 Encounter for antineoplastic immunotherapy: Secondary | ICD-10-CM | POA: Diagnosis not present

## 2021-05-03 DIAGNOSIS — C50512 Malignant neoplasm of lower-outer quadrant of left female breast: Secondary | ICD-10-CM

## 2021-05-03 DIAGNOSIS — Z171 Estrogen receptor negative status [ER-]: Secondary | ICD-10-CM

## 2021-05-03 MED ORDER — TRASTUZUMAB-ANNS CHEMO 150 MG IV SOLR
6.0000 mg/kg | Freq: Once | INTRAVENOUS | Status: AC
  Administered 2021-05-03: 441 mg via INTRAVENOUS
  Filled 2021-05-03: qty 21

## 2021-05-03 MED ORDER — SODIUM CHLORIDE 0.9 % IV SOLN
420.0000 mg | Freq: Once | INTRAVENOUS | Status: AC
  Administered 2021-05-03: 420 mg via INTRAVENOUS
  Filled 2021-05-03: qty 14

## 2021-05-03 MED ORDER — SODIUM CHLORIDE 0.9 % IV SOLN
Freq: Once | INTRAVENOUS | Status: AC
Start: 2021-05-03 — End: 2021-05-03

## 2021-05-03 MED ORDER — ACETAMINOPHEN 325 MG PO TABS
650.0000 mg | ORAL_TABLET | Freq: Once | ORAL | Status: AC
  Administered 2021-05-03: 650 mg via ORAL
  Filled 2021-05-03: qty 2

## 2021-05-03 MED ORDER — DIPHENHYDRAMINE HCL 25 MG PO CAPS
25.0000 mg | ORAL_CAPSULE | Freq: Once | ORAL | Status: AC
  Administered 2021-05-03: 25 mg via ORAL
  Filled 2021-05-03: qty 1

## 2021-05-06 ENCOUNTER — Other Ambulatory Visit: Payer: Self-pay | Admitting: *Deleted

## 2021-05-06 ENCOUNTER — Encounter: Payer: Self-pay | Admitting: Physical Therapy

## 2021-05-06 ENCOUNTER — Other Ambulatory Visit: Payer: Self-pay

## 2021-05-06 ENCOUNTER — Ambulatory Visit: Payer: Managed Care, Other (non HMO) | Attending: General Surgery | Admitting: Physical Therapy

## 2021-05-06 DIAGNOSIS — C50512 Malignant neoplasm of lower-outer quadrant of left female breast: Secondary | ICD-10-CM | POA: Diagnosis present

## 2021-05-06 DIAGNOSIS — Z171 Estrogen receptor negative status [ER-]: Secondary | ICD-10-CM | POA: Insufficient documentation

## 2021-05-06 DIAGNOSIS — R293 Abnormal posture: Secondary | ICD-10-CM | POA: Insufficient documentation

## 2021-05-06 DIAGNOSIS — Z483 Aftercare following surgery for neoplasm: Secondary | ICD-10-CM | POA: Diagnosis present

## 2021-05-06 NOTE — Progress Notes (Signed)
Location of Breast Cancer: lower-outer quadrant of left breast  Histology per Pathology Report:  04/16/2021   11/04/2020 Diagnosis 1. Breast, left, needle core biopsy, 3:30 o'clock - INVASIVE DUCTAL CARCINOMA, SEE COMMENT. 2. Lymph node, needle/core biopsy, left axilla - METASTATIC CARCINOMA.  Receptor Status:  The tumor cells are EQUIVOCAL for Her2 (2+). Her2 by FISH will be performed and results reported separately. Estrogen Receptor: 0%, NEGATIVE Progesterone Receptor: 0%, NEGATIVE Proliferation Marker Ki67: 20%  Did patient present with symptoms (if so, please note symptoms) or was this found on screening mammography?: Screening mammogram showed a possible left breast mass and enlarged axillary lymph nodes.  Past/Anticipated interventions by surgeon, if any:  04/16/2021 Procedure(s): LEFT BREAST LUMPECTOMY WITH RADIOACTIVE SEED LOCALIZATION AND DEEP LEFT AXILLARY SENTINEL LYMPH NODE BIOPSY WITH RADIOACTIVE SEED GUIDED LEFT AXILLARY TARGETED LYMPH NODE DISSECTION (Left)     SURGEON:  Surgeon(s) and Role:    * Jovita Kussmaul, MD - Primary  Past/Anticipated interventions by medical oncology, if any: Dr Lindi Adie Patient is on Treatment Plan: BREAST  DOCETAXEL + CARBOPLATIN + TRASTUZUMAB + PERTUZUMAB  (TCHP) Q21D   Lymphedema issues, if any:  no    Pain issues, if any:  no   SAFETY ISSUES: Prior radiation? no Pacemaker/ICD? no Possible current pregnancy?no, postmenopausal Is the patient on methotrexate? 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

## 2021-05-06 NOTE — Patient Instructions (Addendum)
            [facility]         [address], [place] [address]         [phone]         Annia Friendly, PT, CLT   After Breast Cancer Class It is recommended you attend the ABC class to be educated on lymphedema risk reduction. This class is free of charge and lasts for 1 hour. It is a 1-time class. You are scheduled for October 17th at 11:00. We will send you a link to your email.  Scar massage You should begin scar massage stretching the skin at the incision sites a few minutes followed by massaging with coconut oil for a few minutes daily.  Compression garment I recommend a Hugger compression bra for temporary use to reduce your swelling. You can get it at [facility] [employer] [facility] [employer].  Home exercise Program You should continue the exercises once a day until you finish radiation.  Follow up PT: It is recommended you return every 3 months for the first 3 years following surgery to be assessed on the SOZO machine for an L-Dex score. This helps prevent clinically significant lymphedema in 95% of patients. These follow up screens are 10 minute appointments that you are not billed for. WE WILL BE LOCATED AT [facility] AT [address] [place] [address]. The phone number is [phone]. You are scheduled for December 12th at 12:00 - but just come that day whenever you finish Herceptin infusion.

## 2021-05-06 NOTE — Therapy (Signed)
[place], [place], [address] Phone: [phone]   Fax:  [phone]  Physical Therapy Treatment  Patient Details  Name: Kristina Harvey MRN: [id number] Date of Birth: 04/17/1966 Referring Provider (PT): Dr. Autumn Messing   Encounter Date: 05/06/2021   PT End of Session - 05/06/21 1151     Visit Number 2    Number of Visits 2    PT Start Time 1104    PT Stop Time 1146    PT Time Calculation (min) 42 min    Activity Tolerance Patient tolerated treatment well    Behavior During Therapy [facility] for tasks assessed/performed             Past Medical History:  Diagnosis Date   Arrhythmia    paroxysmal atrial fibrillation   Cancer [facility])    Family history of breast cancer    Family history of prostate cancer    Family history of stomach cancer    Hyperlipidemia    Hypothyroidism    Thyroid disease     Past Surgical History:  Procedure Laterality Date   BREAST LUMPECTOMY WITH RADIOACTIVE SEED AND SENTINEL LYMPH NODE BIOPSY Left 04/16/2021   Procedure: LEFT BREAST LUMPECTOMY WITH RADIOACTIVE SEED AND SENTINEL LYMPH NODE BIOPSY;  Surgeon: Jovita Kussmaul, MD;  Location: [place];  Service: General;  Laterality: Left;   chin implant  1996   IR IMAGING GUIDED PORT INSERTION  11/24/2020   RADIOACTIVE SEED GUIDED AXILLARY SENTINEL LYMPH NODE Left 04/16/2021   Procedure: RADIOACTIVE SEED GUIDED AXILLARY SENTINEL LYMPH NODE DISSECTION;  Surgeon: Jovita Kussmaul, MD;  Location: [place];  Service: General;  Laterality: Left;    There were no vitals filed for this visit.   Subjective Assessment - 05/06/21 1109     Subjective Patient underwent neoadjuvant chemotherapy from 11/27/220-03/12/2021 followed by a left lumpectomy and sentinel node biopsy (3 negative nodes) on 04/16/2021. She will continue Herceptin for 10 more treatments. She meets with radiation oncology on 05/10/2021.    Pertinent History Patient was diagnosed on  10/14/2020 with left grade III invasive ductal carcinoma. She  underwent neoadjuvant chemotherapy from 11/27/220-03/12/2021 followed by a left lumpectomy and sentinel node biopsy (3 negative nodes) on 04/16/2021. It is ER/PR negative and HER2 positive with a Ki67 of 20%.    Patient Stated Goals See how my arm is doing    Currently in Pain? No/denies   Just some soreness at incision sites               [facility] PT Assessment - 05/06/21 0001       Assessment   Medical Diagnosis s/p left lumpectomy and SLNB    Referring Provider (PT) Dr. Autumn Messing    Onset Date/Surgical Date 04/16/21    Hand Dominance Right    Prior Therapy Baselines      Precautions   Precautions Other (comment)    Precaution Comments recent surgery and left arm lymphedema risk      Restrictions   Weight Bearing Restrictions No      Balance Screen   Has the patient fallen in the past 6 months No    Has the patient had a decrease in activity level because of a fear of falling?  No    Is the patient reluctant to leave their home because of a fear of falling?  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 Tightness present at incision sites from scar tissue.      Posture/Postural Control   Posture/Postural Control Postural limitations    Postural Limitations Rounded Shoulders;Forward head      ROM / Strength   AROM / PROM / Strength AROM      AROM   AROM Assessment Site Shoulder    Right/Left Shoulder  Left    Left Shoulder Extension 48 Degrees    Left Shoulder Flexion 149 Degrees    Left Shoulder ABduction 159 Degrees    Left Shoulder Internal Rotation 67 Degrees    Left Shoulder External Rotation 88 Degrees               LYMPHEDEMA/ONCOLOGY QUESTIONNAIRE - 05/06/21 0001       Type   Cancer Type Left breast cancer      Surgeries   Lumpectomy Date 04/16/21    Sentinel Lymph Node Biopsy Date 04/16/21    Number Lymph Nodes Removed 3      Treatment   Active Chemotherapy Treatment No    Past Chemotherapy Treatment Yes    Date 03/12/21    Active Radiation Treatment No    Past Radiation Treatment No    Current Hormone Treatment No    Past Hormone Therapy No      What other symptoms do you have   Are you Having Heaviness or Tightness Yes    Are you having Pain No    Are you having pitting edema No    Is it Hard or Difficult finding clothes that fit No    Do you have infections No    Is there Decreased scar mobility Yes    Stemmer Sign No      Lymphedema Assessments   Lymphedema Assessments Upper extremities      Right Upper Extremity Lymphedema   10 cm Proximal to Olecranon Process 26.9 cm    Olecranon Process 24.4 cm    10 cm Proximal to Ulnar Styloid Process 21.7 cm    Just Proximal to Ulnar Styloid Process 15 cm    Across Hand at [employer] 18.2 cm    At [place] of 2nd Digit 6.1 cm      Left Upper Extremity Lymphedema   10 cm Proximal to Olecranon Process 25.7 cm    Olecranon Process 23.6 cm    10 cm Proximal to Ulnar Styloid Process 20.8 cm    Just Proximal to Ulnar Styloid Process 14.7 cm    Across Hand at [employer] 17.7 cm    At [place] of 2nd Digit 5.9 cm                Quick Dash - 05/06/21 0001     Open a tight or new jar No difficulty    Do heavy household chores (wash walls, wash floors) No difficulty    Carry a shopping bag or briefcase No difficulty    Wash your back No difficulty  Use a knife to cut food No difficulty     Recreational activities in which you take some force or impact through your arm, shoulder, or hand (golf, hammering, tennis) No difficulty    During the past week, to what extent has your arm, shoulder or hand problem interfered with your normal social activities with family, friends, neighbors, or groups? Not at all    During the past week, to what extent has your arm, shoulder or hand problem limited your work or other regular daily activities Not at all    Arm, shoulder, or hand pain. Mild    Tingling (pins and needles) in your arm, shoulder, or hand Moderate    Difficulty Sleeping No difficulty    DASH Score 6.82 %                             PT Education - 05/06/21 1151     Education Details Aftercare; scar massage; lymphedema education    Person(s) Educated Patient    Methods Explanation;Demonstration;Handout    Comprehension Returned demonstration;Verbalized understanding                 PT Long Term Goals - 05/06/21 1156       PT LONG TERM GOAL #1   Title Patient will demonstrate she has regained full shoulder ROM and function post operatively compared to baselines.    Time 6    Period Months    Status Achieved                   Plan - 05/06/21 1151     Clinical Impression Statement Patient is doing very well s/p left lumpectomy and sentinel node biopsy (3 negative nodes) on 04/16/2021. Her incisions are completely healed and she is continuing to recover from chemo. She reports moderate neuropathy in both hands and feet and was encouraged to do weightbearing exercises (mostly walking) to help with those symptoms. She has regained shoulder ROM and shows no signs of lymphedema, although her left breast has some swelling. She will benefit from a compression bra and was given information on where to get that. She will particiapte in the After Breast Cancer class but otherwise has no PT needs at this time.    PT Treatment/Interventions ADLs/Self  Care Home Management;Therapeutic exercise;Patient/family education    PT Next Visit Plan D/C    PT Home Exercise Plan Post op shoulder ROM HEP    Consulted and Agree with Plan of Care Patient             Patient will benefit from skilled therapeutic intervention in order to improve the following deficits and impairments:  Postural dysfunction, Decreased range of motion, Decreased knowledge of precautions, Impaired UE functional use, Pain  Visit Diagnosis: Malignant neoplasm of lower-outer quadrant of left breast of female, estrogen receptor negative ([facility])  Abnormal posture  Aftercare following surgery for neoplasm     Problem List Patient Active Problem List   Diagnosis Date Noted   Port-A-Cath in place 01/07/2021   Family history of breast cancer    Family history of prostate cancer    Family history of stomach cancer    Malignant neoplasm of lower-outer quadrant of left 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   Remaining deficits: Mild edema left breast   Education / Equipment: Lymphedema education and HEP  Patient agrees to discharge. Patient goals were met. Patient is being discharged due to meeting the stated rehab goals.  Annia Friendly, [place] 05/06/21 11:58 AM   [place], [place], [address] Phone: [phone]   Fax:  [phone]  Name: Kristina Harvey MRN: [id number] Date of Birth: 08-22-1965

## 2021-05-08 NOTE — Progress Notes (Signed)
Radiation Oncology         [phone] ________________________________  Name: Kristina Harvey MRN: [id number]  Date: 05/10/2021  DOB: 1965/12/21  Re-Evaluation Note  CC: Alroy Dust, L.Marlou Sa, MD  Nicholas Lose, MD    ICD-10-CM   1. Malignant neoplasm of lower-outer quadrant of left breast of female, estrogen receptor negative ([place])  C50.512    Z17.1       Diagnosis:   Stage II-A (cT1c, cN1)  Left Breast LOQ, Invasive Ductal Carcinoma, ER- / PR- / Her2+, Grade 3   Left Lumpectomy 04/16/21: no residual carcinoma identified status post-neoadjuvant chemotherapy  Narrative:  The patient returns today to discuss radiation treatment options. She was seen in the multidisciplinary breast clinic on 11/11/20.   The patient underwent genetic testing back in February of 2019 through the [facility] panel offered by [employer]. Results were negative.  She has been treated with 6 cycles of [place] starting on 11/27/20 through 03/12/21,  followed by Herceptin and Perjeta maintenance under Dr. Lindi Adie. Her major side effects were neuropathy, hair loss, and diarrhea.  Breast MRI performed on 03/13/2021 demonstrated marked interval improvement in the appearance of breast cancer with no residual mass or enhancement, both of the abnormal lymph nodes were seen to have improved in size and appearance.  She opted to proceed with left breast lumpectomy with nodal biopsies on 04/16/21. Pathology from the procedure showed no residual carcinoma identified status post-neoadjuvant chemotherapy. No carcinoma identified in 3/3 biopsied left axillary sentinel lymph nodes.  Of note: echocardiogram on 03/08/21 revealed EF of 60 to 65%.  On review of systems, the patient reports overall feeling well. She denies pain within the left breast problems with swelling in her left arm or hand and any other symptoms.    Allergies:  is allergic to caffeine.  Meds: Current Outpatient Medications  Medication Sig Dispense  Refill   acetaminophen (TYLENOL) 500 MG tablet Take 1,000 mg by mouth every 6 (six) hours as needed for moderate pain.     colestipol (COLESTID) 1 g tablet Take 2 g by mouth at bedtime.     levothyroxine (SYNTHROID) 75 MCG tablet Take 75 mcg by mouth daily before breakfast.     metoprolol succinate (TOPROL-XL) 25 MG 24 hr tablet TAKE ONE TABLET BY MOUTH DAILY WITH OR IMMEDIATELY FOLLOWING A MEAL (Patient taking differently: Take 25 mg by mouth daily.) 90 tablet 1   metoprolol tartrate (LOPRESSOR) 25 MG tablet Take 25 mg by mouth daily as needed (palpitations).     Multiple Vitamin (MULTIVITAMIN WITH MINERALS) TABS tablet Take 1 tablet by mouth daily.     Polyethyl Glycol-Propyl Glycol (SYSTANE OP) Place 1 drop into both eyes 2 (two) times daily.     Probiotic Product (PROBIOTIC PO) Take 1 capsule by mouth daily.     Psyllium (METAMUCIL PO) Take 1 Scoop by mouth daily.     Vitamin D-Vitamin K (VITAMIN K2-VITAMIN D3 PO) Take 1 tablet by mouth daily.     ALPRAZolam (XANAX) 0.25 MG tablet Take 0.25 mg by mouth at bedtime as needed for sleep. (Patient not taking: Reported on 05/10/2021)     fluorouracil (EFUDEX) 5 % cream Apply 1 application topically daily. (Patient not taking: Reported on 05/10/2021)     HYDROcodone-acetaminophen (NORCO/VICODIN) 5-325 MG tablet Take 1 tablet by mouth every 6 (six) hours as needed for moderate pain or severe pain. (Patient not taking: Reported on 05/10/2021) 15 tablet 0   No current facility-administered medications for this encounter.  Physical Findings: The patient is in no acute distress. Patient is alert and oriented.  height is 5' 7"  (1.702 m) and weight is 161 lb 3.2 oz (73.1 kg). Her temperature is 97.2 F (36.2 C) (abnormal). Her blood pressure is 129/71 and her pulse is 86. Her respiration is 18 and oxygen saturation is 100%.  No significant changes. Lungs are clear to auscultation bilaterally. Heart has regular rate and rhythm. No palpable cervical,  supraclavicular, or axillary adenopathy. Abdomen soft, non-tender, normal bowel sounds.  Left breast shows a well-healing scar in the lower outer quadrant along the inframammary fold crease.  She also has a separate scar in the axillary region which is also healing well.  No signs of infection in the breast.  No nipple discharge or bleeding.  No dominant mass appreciated in the breast.  Lab Findings: Lab Results  Component Value Date   WBC 6.0 04/02/2021   HGB 10.4 (L) 04/02/2021   HCT 30.8 (L) 04/02/2021   MCV 110.4 (H) 04/02/2021   PLT 221 04/02/2021    Radiographic Findings: NM Sentinel Node Inj-No Rpt (Breast)  Result Date: 04/16/2021 Sulfur Colloid was injected by the Nuclear Medicine Technologist for sentinel lymph node localization.   MM Breast Surgical Specimen  Result Date: 04/16/2021 CLINICAL DATA:  Specimen radiograph status post left breast lumpectomy. EXAM: SPECIMEN RADIOGRAPH OF THE LEFT BREAST COMPARISON:  Previous exam(s). FINDINGS: Status post excision of the left breast. The radioactive seed and biopsy marker clip are present, completely intact, and were marked for pathology. These findings were communicated with the OR at 11:25 a.m. IMPRESSION: Specimen radiograph of the left breast. Electronically Signed   By: Ammie Ferrier M.D.   On: 04/16/2021 11:26  MM Breast Surgical Specimen  Result Date: 04/16/2021 CLINICAL DATA:  Specimen radiograph status post targeted left axillary lymph node dissection. EXAM: SPECIMEN RADIOGRAPH OF THE LEFT AXILLA COMPARISON:  Previous exam(s). FINDINGS: Status post excision of the left axilla. The radioactive seed and biopsy marker clip are present, completely intact, and were marked for pathology. These findings were communicated with the OR at 10:43 a.m. IMPRESSION: Specimen radiograph of the left axilla. Electronically Signed   By: Ammie Ferrier M.D.   On: 04/16/2021 10:45  MM LT RADIOACTIVE SEED LOC MAMMO GUIDE  Result Date:  04/15/2021 CLINICAL DATA:  55 year old female presenting for radioactive seed localization of the left breast prior to lumpectomy. EXAM: MAMMOGRAPHIC GUIDED RADIOACTIVE SEED LOCALIZATION OF THE LEFT BREAST COMPARISON:  Previous exam(s). FINDINGS: Patient presents for radioactive seed localization prior to left breast lumpectomy. I met with the patient and we discussed the procedure of seed localization including benefits and alternatives. We discussed the high likelihood of a successful procedure. We discussed the risks of the procedure including infection, bleeding, tissue injury and further surgery. We discussed the low dose of radioactivity involved in the procedure. Informed, written consent was given. The usual time-out protocol was performed immediately prior to the procedure. Using mammographic guidance, sterile technique, 1% lidocaine and an I-125 radioactive seed, the ribbon shaped biopsy marking clip in the lateral posterior left breast was localized using a lateral approach. The follow-up mammogram images confirm the seed in the expected location and were marked for Dr. Marlou Starks. Follow-up survey of the patient confirms presence of the radioactive seed. Order number of I-125 seed:  [id number]. Total activity:  7.846 millicuries reference Date: 01/22/2021 The patient tolerated the procedure well and was released from the [facility]. She was given instructions regarding seed removal. IMPRESSION:  Radioactive seed localization left breast. No apparent complications. Electronically Signed   By: Ammie Ferrier M.D.   On: 04/15/2021 15:37  [place] LT RADIOACTIVE SEED LOC  Result Date: 04/15/2021 CLINICAL DATA:  55 year old female presenting for radioactive seed localization of a left axillary lymph node. EXAM: ULTRASOUND GUIDED RADIOACTIVE SEED LOCALIZATION OF THE LEFT BREAST COMPARISON:  Previous exam(s). FINDINGS: Patient presents for radioactive seed localization prior to left axillary lymph node dissection. I  met with the patient and we discussed the procedure of seed localization including benefits and alternatives. We discussed the high likelihood of a successful procedure. We discussed the risks of the procedure including infection, bleeding, tissue injury and further surgery. We discussed the low dose of radioactivity involved in the procedure. Informed, written consent was given. The usual time-out protocol was performed immediately prior to the procedure. Using ultrasound guidance, sterile technique, 1% lidocaine and an I-125 radioactive seed, the left axillary lymph node with the biopsy marking clip was localized using a lateral approach. The follow-up mammogram images confirm the seed in the expected location and were marked for Dr. Marlou Starks. Follow-up survey of the patient confirms presence of the radioactive seed. Order number of I-125 seed:  [id number]. Total activity:  6.815 millicuries reference Date: 04/05/2021 The patient tolerated the procedure well and was released from the [facility]. She was given instructions regarding seed removal. IMPRESSION: Radioactive seed localization left axilla. No apparent complications. Electronically Signed   By: Ammie Ferrier M.D.   On: 04/15/2021 15:39   Impression:  Stage II-A (cT1c, cN1)  Left Breast LOQ, Invasive Ductal Carcinoma, ER- / PR- / Her2+, Grade 3   Left Lumpectomy 04/16/21: no residual carcinoma identified status post-neoadjuvant chemotherapy  The patient was found to have a complete response to her neoadjuvant chemotherapy.  At the time of presentation the patient did have biopsy-proven nodal metastasis as well as 2 abnormal appearing left axillary lymph nodes.  Given this situation I would recommend adjuvant radiation therapy to the axillary region along with her breast radiation treatments.  I discussed the general course of radiation therapy potential side effects and long-term toxicities of radiation therapy with the patient and her husband.   She appears to understand and wishes to proceed with planned course of treatment  Plan:  Patient is scheduled for  CT simulation later today.  Anticipate 6 and half weeks of radiation therapy.  We will use cardiac sparing techniques if necessary.Marland Kitchen   -----------------------------------  Blair Promise, PhD, MD  This document serves as a record of services personally performed by Gery Pray, MD. It was created on his behalf by Roney Mans, a trained medical scribe. The creation of this record is based on the scribe's personal observations and the provider's statements to them. This document has been checked and approved by the attending provider.

## 2021-05-10 ENCOUNTER — Ambulatory Visit
Admission: RE | Admit: 2021-05-10 | Discharge: 2021-05-10 | Disposition: A | Discharge status: Home / Self Care | Payer: Managed Care, Other (non HMO) | Source: Ambulatory Visit | Attending: Radiation Oncology | Admitting: Radiation Oncology

## 2021-05-10 ENCOUNTER — Other Ambulatory Visit: Payer: Self-pay

## 2021-05-10 ENCOUNTER — Encounter: Payer: Self-pay | Admitting: Radiation Oncology

## 2021-05-10 VITALS — BP 129/71 | HR 86 | Temp 97.2°F | Resp 18 | Ht 67.0 in | Wt 161.2 lb

## 2021-05-10 VITALS — BP 129/71 | HR 86 | Temp 97.2°F | Resp 18 | Wt 161.2 lb

## 2021-05-10 DIAGNOSIS — Z171 Estrogen receptor negative status [ER-]: Secondary | ICD-10-CM | POA: Insufficient documentation

## 2021-05-10 DIAGNOSIS — C50512 Malignant neoplasm of lower-outer quadrant of left female breast: Secondary | ICD-10-CM

## 2021-05-10 DIAGNOSIS — Z79899 Other long term (current) drug therapy: Secondary | ICD-10-CM | POA: Insufficient documentation

## 2021-05-10 DIAGNOSIS — C773 Secondary and unspecified malignant neoplasm of axilla and upper limb lymph nodes: Secondary | ICD-10-CM | POA: Insufficient documentation

## 2021-05-10 NOTE — Progress Notes (Signed)
See MD note for nursing evaluation. °

## 2021-05-11 ENCOUNTER — Other Ambulatory Visit: Payer: Self-pay | Admitting: Cardiovascular Disease

## 2021-05-17 ENCOUNTER — Encounter: Payer: Self-pay | Admitting: *Deleted

## 2021-05-18 ENCOUNTER — Ambulatory Visit
Admission: RE | Admit: 2021-05-18 | Discharge: 2021-05-18 | Disposition: A | Discharge status: Home / Self Care | Payer: Managed Care, Other (non HMO) | Source: Ambulatory Visit | Attending: Radiation Oncology | Admitting: Radiation Oncology

## 2021-05-18 ENCOUNTER — Other Ambulatory Visit: Payer: Self-pay

## 2021-05-18 DIAGNOSIS — Z171 Estrogen receptor negative status [ER-]: Secondary | ICD-10-CM | POA: Insufficient documentation

## 2021-05-18 DIAGNOSIS — Z5112 Encounter for antineoplastic immunotherapy: Secondary | ICD-10-CM | POA: Insufficient documentation

## 2021-05-18 DIAGNOSIS — C50512 Malignant neoplasm of lower-outer quadrant of left female breast: Secondary | ICD-10-CM | POA: Insufficient documentation

## 2021-05-18 MED ORDER — ALRA NON-METALLIC DEODORANT (RAD-ONC)
1.0000 "application " | Freq: Once | TOPICAL | Status: AC
  Administered 2021-05-18: 1 via TOPICAL

## 2021-05-18 MED ORDER — RADIAPLEXRX EX GEL: Freq: Once | CUTANEOUS | Status: AC

## 2021-05-18 NOTE — Progress Notes (Signed)
Pt here for patient teaching.    Pt given Radiation and You booklet, skin care instructions, Alra deodorant, and Radiaplex gel.    Reviewed areas of pertinence such as fatigue, hair loss, skin changes, breast tenderness, breast swelling, and taste changes .   Pt able to give teach back of to pat skin, use unscented/gentle soap, and use baby wipes,apply Radiaplex bid, avoid applying anything to skin within 4 hours of treatment, avoid wearing an under wire bra, and to use an electric razor if they must shave.   Pt verbalizes understanding of information given and will contact nursing with any questions or concerns.    [email or web address]

## 2021-05-19 ENCOUNTER — Ambulatory Visit
Admission: RE | Admit: 2021-05-19 | Discharge: 2021-05-19 | Disposition: A | Discharge status: Home / Self Care | Payer: Managed Care, Other (non HMO) | Source: Ambulatory Visit | Attending: Radiation Oncology | Admitting: Radiation Oncology

## 2021-05-19 DIAGNOSIS — Z5112 Encounter for antineoplastic immunotherapy: Secondary | ICD-10-CM | POA: Diagnosis not present

## 2021-05-20 ENCOUNTER — Other Ambulatory Visit: Payer: Self-pay

## 2021-05-20 ENCOUNTER — Ambulatory Visit
Admission: RE | Admit: 2021-05-20 | Discharge: 2021-05-20 | Disposition: A | Discharge status: Home / Self Care | Payer: Managed Care, Other (non HMO) | Source: Ambulatory Visit | Attending: Radiation Oncology | Admitting: Radiation Oncology

## 2021-05-20 DIAGNOSIS — Z5112 Encounter for antineoplastic immunotherapy: Secondary | ICD-10-CM | POA: Diagnosis not present

## 2021-05-21 ENCOUNTER — Other Ambulatory Visit: Payer: Self-pay | Admitting: *Deleted

## 2021-05-21 ENCOUNTER — Ambulatory Visit
Admission: RE | Admit: 2021-05-21 | Discharge: 2021-05-21 | Disposition: A | Discharge status: Home / Self Care | Payer: Managed Care, Other (non HMO) | Source: Ambulatory Visit | Attending: Radiation Oncology | Admitting: Radiation Oncology

## 2021-05-21 ENCOUNTER — Other Ambulatory Visit: Payer: Self-pay

## 2021-05-21 DIAGNOSIS — Z171 Estrogen receptor negative status [ER-]: Secondary | ICD-10-CM

## 2021-05-21 DIAGNOSIS — C50512 Malignant neoplasm of lower-outer quadrant of left female breast: Secondary | ICD-10-CM

## 2021-05-21 DIAGNOSIS — Z5112 Encounter for antineoplastic immunotherapy: Secondary | ICD-10-CM | POA: Diagnosis not present

## 2021-05-22 NOTE — Progress Notes (Signed)
Patient Care Team: Alroy Dust, L.Marlou Sa, MD as PCP - General (Family Medicine) Jovita Kussmaul, MD as Consulting Physician (General Surgery) Nicholas Lose, MD as Consulting Physician (Hematology and Oncology) Gery Pray, MD as Consulting Physician (Radiation Oncology) Mauro Kaufmann, RN as Oncology Nurse Navigator Rockwell Germany, RN as Oncology Nurse Navigator  DIAGNOSIS:    ICD-10-CM   1. Malignant neoplasm of lower-outer quadrant of left breast of female, estrogen receptor negative ([place])  C50.512    Z17.1       SUMMARY OF ONCOLOGIC HISTORY: Oncology History  Malignant neoplasm of lower-outer quadrant of left breast of female, estrogen receptor negative ([address])  11/04/2020 Initial Diagnosis   Screening mammogram showed a possible left breast mass and enlarged axillary lymph nodes. Diagnostic mammogram and US showed a 1.4cm mass at the 3:30 position in the left breast and 2 abnormal lymph nodes in the left axilla. 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   11/11/2020 Cancer Staging   Staging form: Breast, AJCC 8th Edition - Clinical stage from 11/11/2020: Stage IIA (cT1c, cN1, cM0, G3, ER-, PR-, HER2+) - Signed by Nicholas Lose, MD on 11/11/2020 Stage prefix: Initial diagnosis Histologic grading system: 3 grade system Laterality: Left Staged by: Pathologist and managing physician Stage used in treatment planning: Yes National guidelines used in treatment planning: Yes Type of national guideline used in treatment planning: NCCN   11/27/2020 - 04/02/2021 Chemotherapy          04/16/2021 Surgery   Left lumpectomy 04/16/2021: Pathologic complete response, 0/3 lymph nodes negative   05/03/2021 -  Chemotherapy      Patient is on Antibody Plan: BREAST TRASTUZUMAB + PERTUZUMAB Q21D       CHIEF COMPLIANT: Herceptin Perjeta maintenance  INTERVAL HISTORY: Kristina Harvey is a 55 y.o. with  above-mentioned history of breast cancer having completed neoadjuvant chemotherapy with [place] and radiation, currently on maintenance with Herceptin Perjeta. She presents to the clinic today for follow-up.    ALLERGIES:  is allergic to caffeine.  MEDICATIONS:  Current Outpatient Medications  Medication Sig Dispense Refill   acetaminophen (TYLENOL) 500 MG tablet Take 1,000 mg by mouth every 6 (six) hours as needed for moderate pain.     ALPRAZolam (XANAX) 0.25 MG tablet Take 0.25 mg by mouth at bedtime as needed for sleep. (Patient not taking: Reported on 05/10/2021)     colestipol (COLESTID) 1 g tablet Take 2 g by mouth at bedtime.     fluorouracil (EFUDEX) 5 % cream Apply 1 application topically daily. (Patient not taking: Reported on 05/10/2021)     HYDROcodone-acetaminophen (NORCO/VICODIN) 5-325 MG tablet Take 1 tablet by mouth every 6 (six) hours as needed for moderate pain or severe pain. (Patient not taking: Reported on 05/10/2021) 15 tablet 0   levothyroxine (SYNTHROID) 75 MCG tablet Take 75 mcg by mouth daily before breakfast.     metoprolol succinate (TOPROL-XL) 25 MG 24 hr tablet Take 1 tablet (25 mg total) by mouth daily. PLEASE SCHEDULE OFFICE VISIT FOR FURTHER REFILLS. THANK YOU! 90 tablet 0   metoprolol tartrate (LOPRESSOR) 25 MG tablet Take 25 mg by mouth daily as needed (palpitations).     Multiple Vitamin (MULTIVITAMIN WITH MINERALS) TABS tablet Take 1 tablet by mouth daily.     Polyethyl Glycol-Propyl Glycol (SYSTANE OP) Place 1 drop into both eyes 2 (two) times daily.     Probiotic Product (PROBIOTIC PO) Take  1 capsule by mouth daily.     Psyllium (METAMUCIL PO) Take 1 Scoop by mouth daily.     Vitamin D-Vitamin K (VITAMIN K2-VITAMIN D3 PO) Take 1 tablet by mouth daily.     No current facility-administered medications for this visit.    PHYSICAL EXAMINATION: ECOG PERFORMANCE STATUS: 1 - Symptomatic but completely ambulatory  Vitals:   05/24/21 0835  BP: 128/69   Pulse: 89  Resp: 18  Temp: 97.9 F (36.6 C)  SpO2: 100%   Filed Weights   05/24/21 0835  Weight: 161 lb 3.2 oz (73.1 kg)    BREAST: No palpable masses or nodules in either right or left breasts. No palpable axillary supraclavicular or infraclavicular adenopathy no breast tenderness or nipple discharge. (exam performed in the presence of a chaperone)  LABORATORY DATA:  I have reviewed the data as listed CMP Latest Ref Rng & Units 04/02/2021 03/12/2021 02/19/2021  Glucose 70 - 99 mg/dL 99 151(H) 89  BUN 6 - 20 mg/dL 12 9 17   Creatinine 0.44 - 1.00 mg/dL 0.67 0.62 0.61  Sodium 135 - 145 mmol/L 139 140 139  Potassium 3.5 - 5.1 mmol/L 4.2 3.6 3.8  Chloride 98 - 111 mmol/L 105 107 105  CO2 22 - 32 mmol/L 23 24 27   Calcium 8.9 - 10.3 mg/dL 9.1 9.0 9.1  Total Protein 6.5 - 8.1 g/dL 6.6 6.6 6.8  Total Bilirubin 0.3 - 1.2 mg/dL 0.5 0.3 0.5  Alkaline Phos 38 - 126 U/L 63 72 66  AST 15 - 41 U/L 26 17 19   ALT 0 - 44 U/L 15 15 17     Lab Results  Component Value Date   WBC 6.0 04/02/2021   HGB 10.4 (L) 04/02/2021   HCT 30.8 (L) 04/02/2021   MCV 110.4 (H) 04/02/2021   PLT 221 04/02/2021   NEUTROABS 3.7 04/02/2021    ASSESSMENT & PLAN:  Malignant neoplasm of lower-outer quadrant of left breast of female, estrogen receptor negative ([place]) 11/04/2020:Screening mammogram showed a possible left breast mass and enlarged axillary lymph nodes. Diagnostic mammogram and US showed a 1.4cm mass at the 3:30 position in the left breast and 2 abnormal lymph nodes in the left axilla. 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   Treatment Plan: 1. Neoadjuvant chemotherapy with [place] Perjeta 6 cycles completed 03/12/2021 followed by Herceptin Perjeta maintenance versus Kadcyla maintenance (based on response to neoadjuvant chemo) for 1 year 2. Left lumpectomy 04/16/2021: Pathologic complete response, 0/3 lymph nodes  negative 3. Followed by adjuvant radiation therapy 05/19/2021-07/01/2021 4.  Consideration for neratinib ------------------------------------------------------------------------------------------------------------------------------- Current Treatment: Completed 6 cycles of (11/27/20-03/12/2021), currently on Herceptin Perjeta maintenance Herceptin Perjeta toxicities: Alternating diarrhea with constipation.  When she gets diarrhea she goes up to 6 times a day.   Return to clinic for Herceptin Perjeta maintenance Ongoing radiation   Every 3 weeks for Herceptin Perjeta every 6 weeks to follow-up with me.    No orders of the defined types were placed in this encounter.  The patient has a good understanding of the overall plan. she agrees with it. she will call with any problems that may develop before the next visit here.  Total time spent: 30 mins including face to face time and time spent for planning, charting and coordination of care  Rulon Eisenmenger, MD, MPH 05/24/2021  I, Thana Ates, am acting as scribe for Dr. Nicholas Lose.  I have reviewed  the above documentation for accuracy and completeness, and I agree with the above.

## 2021-05-24 ENCOUNTER — Other Ambulatory Visit: Payer: Self-pay | Admitting: *Deleted

## 2021-05-24 ENCOUNTER — Inpatient Hospital Stay: Payer: Managed Care, Other (non HMO) | Attending: Hematology and Oncology

## 2021-05-24 ENCOUNTER — Other Ambulatory Visit: Payer: Self-pay

## 2021-05-24 ENCOUNTER — Inpatient Hospital Stay: Payer: Managed Care, Other (non HMO)

## 2021-05-24 ENCOUNTER — Inpatient Hospital Stay (HOSPITAL_BASED_OUTPATIENT_CLINIC_OR_DEPARTMENT_OTHER): Payer: Managed Care, Other (non HMO) | Admitting: Hematology and Oncology

## 2021-05-24 ENCOUNTER — Ambulatory Visit
Admission: RE | Admit: 2021-05-24 | Discharge: 2021-05-24 | Disposition: A | Discharge status: Home / Self Care | Payer: Managed Care, Other (non HMO) | Source: Ambulatory Visit | Attending: Radiation Oncology | Admitting: Radiation Oncology

## 2021-05-24 DIAGNOSIS — C50512 Malignant neoplasm of lower-outer quadrant of left female breast: Secondary | ICD-10-CM | POA: Diagnosis not present

## 2021-05-24 DIAGNOSIS — Z95828 Presence of other vascular implants and grafts: Secondary | ICD-10-CM

## 2021-05-24 DIAGNOSIS — Z171 Estrogen receptor negative status [ER-]: Secondary | ICD-10-CM | POA: Insufficient documentation

## 2021-05-24 DIAGNOSIS — Z5112 Encounter for antineoplastic immunotherapy: Secondary | ICD-10-CM | POA: Insufficient documentation

## 2021-05-24 DIAGNOSIS — I427 Cardiomyopathy due to drug and external agent: Secondary | ICD-10-CM

## 2021-05-24 LAB — CBC WITH DIFFERENTIAL (CANCER CENTER ONLY)
Abs Immature Granulocytes: 0.01 10*3/uL (ref 0.00–0.07)
Basophils Absolute: 0 10*3/uL (ref 0.0–0.1)
Basophils Relative: 0 %
Eosinophils Absolute: 0.1 10*3/uL (ref 0.0–0.5)
Eosinophils Relative: 2 %
HCT: 35.3 % — ABNORMAL LOW (ref 36.0–46.0)
Hemoglobin: 12 g/dL (ref 12.0–15.0)
Immature Granulocytes: 0 %
Lymphocytes Relative: 26 %
Lymphs Abs: 1.2 10*3/uL (ref 0.7–4.0)
MCH: 35.2 pg — ABNORMAL HIGH (ref 26.0–34.0)
MCHC: 34 g/dL (ref 30.0–36.0)
MCV: 103.5 fL — ABNORMAL HIGH (ref 80.0–100.0)
Monocytes Absolute: 0.4 10*3/uL (ref 0.1–1.0)
Monocytes Relative: 10 %
Neutro Abs: 2.8 10*3/uL (ref 1.7–7.7)
Neutrophils Relative %: 62 %
Platelet Count: 201 10*3/uL (ref 150–400)
RBC: 3.41 MIL/uL — ABNORMAL LOW (ref 3.87–5.11)
RDW: 11.3 % — ABNORMAL LOW (ref 11.5–15.5)
WBC Count: 4.5 10*3/uL (ref 4.0–10.5)
nRBC: 0 % (ref 0.0–0.2)

## 2021-05-24 LAB — CMP (CANCER CENTER ONLY)
ALT: 16 U/L (ref 0–44)
AST: 19 U/L (ref 15–41)
Albumin: 3.6 g/dL (ref 3.5–5.0)
Alkaline Phosphatase: 60 U/L (ref 38–126)
Anion gap: 9 (ref 5–15)
BUN: 10 mg/dL (ref 6–20)
CO2: 25 mmol/L (ref 22–32)
Calcium: 9.2 mg/dL (ref 8.9–10.3)
Chloride: 107 mmol/L (ref 98–111)
Creatinine: 0.66 mg/dL (ref 0.44–1.00)
GFR, Estimated: >60 mL/min (ref >60)
Glucose, Bld: 71 mg/dL (ref 70–99)
Potassium: 3.9 mmol/L (ref 3.5–5.1)
Sodium: 141 mmol/L (ref 135–145)
Total Bilirubin: 0.3 mg/dL (ref 0.3–1.2)
Total Protein: 6.9 g/dL (ref 6.5–8.1)

## 2021-05-24 MED ORDER — SODIUM CHLORIDE 0.9% FLUSH
10.0000 mL | Freq: Once | INTRAVENOUS | Status: AC
  Administered 2021-05-24: 10 mL

## 2021-05-24 MED ORDER — DIPHENHYDRAMINE HCL 25 MG PO CAPS
25.0000 mg | ORAL_CAPSULE | Freq: Once | ORAL | Status: AC
  Administered 2021-05-24: 25 mg via ORAL
  Filled 2021-05-24: qty 1

## 2021-05-24 MED ORDER — SODIUM CHLORIDE 0.9 % IV SOLN: Freq: Once | INTRAVENOUS | Status: AC

## 2021-05-24 MED ORDER — SODIUM CHLORIDE 0.9 % IV SOLN
420.0000 mg | Freq: Once | INTRAVENOUS | Status: AC
  Administered 2021-05-24: 420 mg via INTRAVENOUS
  Filled 2021-05-24: qty 14

## 2021-05-24 MED ORDER — TRASTUZUMAB-ANNS CHEMO 150 MG IV SOLR
6.0000 mg/kg | Freq: Once | INTRAVENOUS | Status: AC
  Administered 2021-05-24: 441 mg via INTRAVENOUS
  Filled 2021-05-24: qty 21

## 2021-05-24 MED ORDER — SODIUM CHLORIDE 0.9% FLUSH
10.0000 mL | INTRAVENOUS | Status: DC | PRN
Start: 2021-05-24 — End: 2021-05-24
  Administered 2021-05-24: 10 mL

## 2021-05-24 MED ORDER — ACETAMINOPHEN 325 MG PO TABS
650.0000 mg | ORAL_TABLET | Freq: Once | ORAL | Status: AC
  Administered 2021-05-24: 650 mg via ORAL
  Filled 2021-05-24: qty 2

## 2021-05-24 MED ORDER — HEPARIN SOD (PORK) LOCK FLUSH 100 UNIT/ML IV SOLN
500.0000 [IU] | Freq: Once | INTRAVENOUS | Status: AC | PRN
  Administered 2021-05-24: 500 [IU]

## 2021-05-24 NOTE — Patient Instructions (Signed)
[facility]  Discharge Instructions: Thank you for choosing [facility] to provide your oncology and hematology care.   If you have a lab appointment with the [facility], please go directly to the [facility] and check in at the registration area.   Wear comfortable clothing and clothing appropriate for easy access to any Portacath or PICC line.   We strive to give you quality time with your provider. You may need to reschedule your appointment if you arrive late (15 or more minutes).  Arriving late affects you and other patients whose appointments are after yours.  Also, if you miss three or more appointments without notifying the office, you may be dismissed from the clinic at the provider's discretion.      For prescription refill requests, have your pharmacy contact our office and allow 72 hours for refills to be completed.    Today you received the following chemotherapy and/or immunotherapy agents Perjeta and Herceptin      To help prevent nausea and vomiting after your treatment, we encourage you to take your nausea medication as directed.  BELOW ARE SYMPTOMS THAT SHOULD BE REPORTED IMMEDIATELY: *FEVER GREATER THAN 100.4 F (38 C) OR HIGHER *CHILLS OR SWEATING *NAUSEA AND VOMITING THAT IS NOT CONTROLLED WITH YOUR NAUSEA MEDICATION *UNUSUAL SHORTNESS OF BREATH *UNUSUAL BRUISING OR BLEEDING *URINARY PROBLEMS (pain or burning when urinating, or frequent urination) *BOWEL PROBLEMS (unusual diarrhea, constipation, pain near the anus) TENDERNESS IN MOUTH AND THROAT WITH OR WITHOUT PRESENCE OF ULCERS (sore throat, sores in mouth, or a toothache) UNUSUAL RASH, SWELLING OR PAIN  UNUSUAL VAGINAL DISCHARGE OR ITCHING   Items with * indicate a potential emergency and should be followed up as soon as possible or go to the Emergency Department if any problems should occur.  Please show the CHEMOTHERAPY ALERT CARD or IMMUNOTHERAPY ALERT CARD at  check-in to the Emergency Department and triage nurse.  Should you have questions after your visit or need to cancel or reschedule your appointment, please contact [facility]  Dept: [phone]  and follow the prompts.  Office hours are 8:00 a.m. to 4:30 p.m. Monday - Friday. Please note that voicemails left after 4:00 p.m. may not be returned until the following business day.  We are closed weekends and major holidays. You have access to a nurse at all times for urgent questions. Please call the main number to the clinic Dept: [phone] and follow the prompts.   For any non-urgent questions, you may also contact your provider using MyChart. We now offer e-Visits for anyone 42 and older to request care online for non-urgent symptoms. For details visit [email or web address].   Also download the MyChart app! Go to the app store, search "MyChart", open the app, select [facility], and log in with your MyChart username and password.  Due to Covid, a mask is required upon entering the hospital/clinic. If you do not have a mask, one will be given to you upon arrival. For doctor visits, patients may have 1 support person aged 45 or older with them. For treatment visits, patients cannot have anyone with them due to current Covid guidelines and our immunocompromised population.

## 2021-05-24 NOTE — Assessment & Plan Note (Signed)
11/04/2020:Screening mammogram showed a possible left breast mass and enlarged axillary lymph nodes. Diagnostic mammogram and US showed a 1.4cm mass at the 3:30 position in the left breast and 2 abnormal lymph nodes in the left axilla. 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  Treatment Plan: 1.Neoadjuvant chemotherapy with [place] Perjeta 6 cyclescompleted 7/29/2022followed by Herceptin Perjeta maintenance versus Kadcyla maintenance (based on response to neoadjuvant chemo) for 1 year 2. Left lumpectomy 04/16/2021: Pathologic complete response, 0/3 lymph nodes negative 3. Followed by adjuvant radiation therapy 05/19/2021-07/01/2021 4.Consideration for neratinib ------------------------------------------------------------------------------------------------------------------------------- Current Treatment:Completed 6 cycles of(11/27/20-03/12/2021), currently on Herceptin Perjeta maintenance Herceptin Perjeta toxicities: None  Return to clinic for Herceptin Perjeta maintenance Ongoing radiation  Every 3 weeks for Herceptin Perjeta every 6 weeks to follow-up with me.

## 2021-05-25 ENCOUNTER — Ambulatory Visit
Admission: RE | Admit: 2021-05-25 | Discharge: 2021-05-25 | Disposition: A | Discharge status: Home / Self Care | Payer: Managed Care, Other (non HMO) | Source: Ambulatory Visit | Attending: Radiation Oncology | Admitting: Radiation Oncology

## 2021-05-25 DIAGNOSIS — Z5112 Encounter for antineoplastic immunotherapy: Secondary | ICD-10-CM | POA: Diagnosis not present

## 2021-05-26 ENCOUNTER — Ambulatory Visit
Admission: RE | Admit: 2021-05-26 | Discharge: 2021-05-26 | Disposition: A | Discharge status: Home / Self Care | Payer: Managed Care, Other (non HMO) | Source: Ambulatory Visit | Attending: Radiation Oncology | Admitting: Radiation Oncology

## 2021-05-26 ENCOUNTER — Other Ambulatory Visit: Payer: Self-pay

## 2021-05-26 DIAGNOSIS — Z5112 Encounter for antineoplastic immunotherapy: Secondary | ICD-10-CM | POA: Diagnosis not present

## 2021-05-27 ENCOUNTER — Ambulatory Visit
Admission: RE | Admit: 2021-05-27 | Discharge: 2021-05-27 | Disposition: A | Discharge status: Home / Self Care | Payer: Managed Care, Other (non HMO) | Source: Ambulatory Visit | Attending: Radiation Oncology | Admitting: Radiation Oncology

## 2021-05-27 ENCOUNTER — Ambulatory Visit (HOSPITAL_COMMUNITY)
Admission: RE | Admit: 2021-05-27 | Discharge: 2021-05-27 | Disposition: A | Discharge status: Home / Self Care | Payer: Managed Care, Other (non HMO) | Source: Ambulatory Visit | Attending: Hematology and Oncology | Admitting: Hematology and Oncology

## 2021-05-27 DIAGNOSIS — Z171 Estrogen receptor negative status [ER-]: Secondary | ICD-10-CM | POA: Insufficient documentation

## 2021-05-27 DIAGNOSIS — Z0189 Encounter for other specified special examinations: Secondary | ICD-10-CM | POA: Diagnosis not present

## 2021-05-27 DIAGNOSIS — Z5181 Encounter for therapeutic drug level monitoring: Secondary | ICD-10-CM | POA: Diagnosis not present

## 2021-05-27 DIAGNOSIS — I427 Cardiomyopathy due to drug and external agent: Secondary | ICD-10-CM | POA: Insufficient documentation

## 2021-05-27 DIAGNOSIS — C50512 Malignant neoplasm of lower-outer quadrant of left female breast: Secondary | ICD-10-CM | POA: Diagnosis not present

## 2021-05-27 LAB — ECHOCARDIOGRAM COMPLETE
Area-P 1/2: 4.1 cm2
S' Lateral: 2.9 cm

## 2021-05-27 NOTE — Progress Notes (Signed)
  Echocardiogram 2D Echocardiogram has been performed.  Kristina Harvey 05/27/2021, 9:55 AM

## 2021-05-28 ENCOUNTER — Ambulatory Visit
Admission: RE | Admit: 2021-05-28 | Discharge: 2021-05-28 | Disposition: A | Discharge status: Home / Self Care | Payer: Managed Care, Other (non HMO) | Source: Ambulatory Visit | Attending: Radiation Oncology | Admitting: Radiation Oncology

## 2021-05-28 ENCOUNTER — Other Ambulatory Visit: Payer: Self-pay

## 2021-05-28 DIAGNOSIS — Z5112 Encounter for antineoplastic immunotherapy: Secondary | ICD-10-CM | POA: Diagnosis not present

## 2021-05-31 ENCOUNTER — Other Ambulatory Visit: Payer: Self-pay

## 2021-05-31 ENCOUNTER — Ambulatory Visit
Admission: RE | Admit: 2021-05-31 | Discharge: 2021-05-31 | Disposition: A | Discharge status: Home / Self Care | Payer: Managed Care, Other (non HMO) | Source: Ambulatory Visit | Attending: Radiation Oncology | Admitting: Radiation Oncology

## 2021-05-31 DIAGNOSIS — Z5112 Encounter for antineoplastic immunotherapy: Secondary | ICD-10-CM | POA: Diagnosis not present

## 2021-06-01 ENCOUNTER — Ambulatory Visit
Admission: RE | Admit: 2021-06-01 | Discharge: 2021-06-01 | Disposition: A | Discharge status: Home / Self Care | Payer: Managed Care, Other (non HMO) | Source: Ambulatory Visit | Attending: Radiation Oncology | Admitting: Radiation Oncology

## 2021-06-01 DIAGNOSIS — Z5112 Encounter for antineoplastic immunotherapy: Secondary | ICD-10-CM | POA: Diagnosis not present

## 2021-06-02 ENCOUNTER — Ambulatory Visit
Admission: RE | Admit: 2021-06-02 | Discharge: 2021-06-02 | Disposition: A | Discharge status: Home / Self Care | Payer: Managed Care, Other (non HMO) | Source: Ambulatory Visit | Attending: Radiation Oncology | Admitting: Radiation Oncology

## 2021-06-02 ENCOUNTER — Other Ambulatory Visit: Payer: Self-pay

## 2021-06-02 DIAGNOSIS — Z5112 Encounter for antineoplastic immunotherapy: Secondary | ICD-10-CM | POA: Diagnosis not present

## 2021-06-03 ENCOUNTER — Ambulatory Visit
Admission: RE | Admit: 2021-06-03 | Discharge: 2021-06-03 | Disposition: A | Discharge status: Home / Self Care | Payer: Managed Care, Other (non HMO) | Source: Ambulatory Visit | Attending: Radiation Oncology | Admitting: Radiation Oncology

## 2021-06-03 DIAGNOSIS — Z5112 Encounter for antineoplastic immunotherapy: Secondary | ICD-10-CM | POA: Diagnosis not present

## 2021-06-04 ENCOUNTER — Ambulatory Visit
Admission: RE | Admit: 2021-06-04 | Discharge: 2021-06-04 | Disposition: A | Discharge status: Home / Self Care | Payer: Managed Care, Other (non HMO) | Source: Ambulatory Visit | Attending: Radiation Oncology | Admitting: Radiation Oncology

## 2021-06-04 ENCOUNTER — Other Ambulatory Visit: Payer: Self-pay

## 2021-06-04 DIAGNOSIS — Z5112 Encounter for antineoplastic immunotherapy: Secondary | ICD-10-CM | POA: Diagnosis not present

## 2021-06-07 ENCOUNTER — Ambulatory Visit
Admission: RE | Admit: 2021-06-07 | Discharge: 2021-06-07 | Disposition: A | Discharge status: Home / Self Care | Payer: Managed Care, Other (non HMO) | Source: Ambulatory Visit | Attending: Radiation Oncology | Admitting: Radiation Oncology

## 2021-06-07 ENCOUNTER — Other Ambulatory Visit: Payer: Self-pay

## 2021-06-07 DIAGNOSIS — Z5112 Encounter for antineoplastic immunotherapy: Secondary | ICD-10-CM | POA: Diagnosis not present

## 2021-06-08 ENCOUNTER — Ambulatory Visit
Admission: RE | Admit: 2021-06-08 | Discharge: 2021-06-08 | Disposition: A | Discharge status: Home / Self Care | Payer: Managed Care, Other (non HMO) | Source: Ambulatory Visit | Attending: Radiation Oncology | Admitting: Radiation Oncology

## 2021-06-08 DIAGNOSIS — Z5112 Encounter for antineoplastic immunotherapy: Secondary | ICD-10-CM | POA: Diagnosis not present

## 2021-06-08 DIAGNOSIS — C50512 Malignant neoplasm of lower-outer quadrant of left female breast: Secondary | ICD-10-CM

## 2021-06-08 DIAGNOSIS — Z171 Estrogen receptor negative status [ER-]: Secondary | ICD-10-CM

## 2021-06-08 MED ORDER — SILVER SULFADIAZINE 1 % EX CREA: TOPICAL_CREAM | Freq: Every day | CUTANEOUS | Status: DC

## 2021-06-09 ENCOUNTER — Other Ambulatory Visit: Payer: Self-pay

## 2021-06-09 ENCOUNTER — Ambulatory Visit
Admission: RE | Admit: 2021-06-09 | Discharge: 2021-06-09 | Disposition: A | Discharge status: Home / Self Care | Payer: Managed Care, Other (non HMO) | Source: Ambulatory Visit | Attending: Radiation Oncology | Admitting: Radiation Oncology

## 2021-06-09 DIAGNOSIS — Z5112 Encounter for antineoplastic immunotherapy: Secondary | ICD-10-CM | POA: Diagnosis not present

## 2021-06-10 ENCOUNTER — Ambulatory Visit
Admission: RE | Admit: 2021-06-10 | Discharge: 2021-06-10 | Disposition: A | Discharge status: Home / Self Care | Payer: Managed Care, Other (non HMO) | Source: Ambulatory Visit | Attending: Radiation Oncology | Admitting: Radiation Oncology

## 2021-06-10 DIAGNOSIS — Z5112 Encounter for antineoplastic immunotherapy: Secondary | ICD-10-CM | POA: Diagnosis not present

## 2021-06-11 ENCOUNTER — Other Ambulatory Visit: Payer: Self-pay

## 2021-06-11 ENCOUNTER — Ambulatory Visit
Admission: RE | Admit: 2021-06-11 | Discharge: 2021-06-11 | Disposition: A | Discharge status: Home / Self Care | Payer: Managed Care, Other (non HMO) | Source: Ambulatory Visit | Attending: Radiation Oncology | Admitting: Radiation Oncology

## 2021-06-11 DIAGNOSIS — Z5112 Encounter for antineoplastic immunotherapy: Secondary | ICD-10-CM | POA: Diagnosis not present

## 2021-06-14 ENCOUNTER — Other Ambulatory Visit: Payer: Managed Care, Other (non HMO)

## 2021-06-14 ENCOUNTER — Other Ambulatory Visit: Payer: Self-pay

## 2021-06-14 ENCOUNTER — Inpatient Hospital Stay: Payer: Managed Care, Other (non HMO)

## 2021-06-14 ENCOUNTER — Ambulatory Visit
Admission: RE | Admit: 2021-06-14 | Discharge: 2021-06-14 | Disposition: A | Discharge status: Home / Self Care | Payer: Managed Care, Other (non HMO) | Source: Ambulatory Visit | Attending: Radiation Oncology | Admitting: Radiation Oncology

## 2021-06-14 ENCOUNTER — Ambulatory Visit: Payer: Managed Care, Other (non HMO)

## 2021-06-14 VITALS — BP 134/79 | HR 87 | Temp 97.0°F | Resp 18 | Wt 159.5 lb

## 2021-06-14 DIAGNOSIS — Z5112 Encounter for antineoplastic immunotherapy: Secondary | ICD-10-CM | POA: Diagnosis not present

## 2021-06-14 DIAGNOSIS — C50512 Malignant neoplasm of lower-outer quadrant of left female breast: Secondary | ICD-10-CM

## 2021-06-14 DIAGNOSIS — Z95828 Presence of other vascular implants and grafts: Secondary | ICD-10-CM

## 2021-06-14 DIAGNOSIS — Z171 Estrogen receptor negative status [ER-]: Secondary | ICD-10-CM

## 2021-06-14 LAB — CBC WITH DIFFERENTIAL (CANCER CENTER ONLY)
Abs Immature Granulocytes: 0.01 10*3/uL (ref 0.00–0.07)
Basophils Absolute: 0 10*3/uL (ref 0.0–0.1)
Basophils Relative: 0 %
Eosinophils Absolute: 0.1 10*3/uL (ref 0.0–0.5)
Eosinophils Relative: 2 %
HCT: 35.5 % — ABNORMAL LOW (ref 36.0–46.0)
Hemoglobin: 12 g/dL (ref 12.0–15.0)
Immature Granulocytes: 0 %
Lymphocytes Relative: 21 %
Lymphs Abs: 0.8 10*3/uL (ref 0.7–4.0)
MCH: 34.2 pg — ABNORMAL HIGH (ref 26.0–34.0)
MCHC: 33.8 g/dL (ref 30.0–36.0)
MCV: 101.1 fL — ABNORMAL HIGH (ref 80.0–100.0)
Monocytes Absolute: 0.4 10*3/uL (ref 0.1–1.0)
Monocytes Relative: 11 %
Neutro Abs: 2.6 10*3/uL (ref 1.7–7.7)
Neutrophils Relative %: 66 %
Platelet Count: 193 10*3/uL (ref 150–400)
RBC: 3.51 MIL/uL — ABNORMAL LOW (ref 3.87–5.11)
RDW: 11.3 % — ABNORMAL LOW (ref 11.5–15.5)
WBC Count: 4 10*3/uL (ref 4.0–10.5)
nRBC: 0 % (ref 0.0–0.2)

## 2021-06-14 LAB — CMP (CANCER CENTER ONLY)
ALT: 14 U/L (ref 0–44)
AST: 17 U/L (ref 15–41)
Albumin: 3.6 g/dL (ref 3.5–5.0)
Alkaline Phosphatase: 61 U/L (ref 38–126)
Anion gap: 9 (ref 5–15)
BUN: 12 mg/dL (ref 6–20)
CO2: 24 mmol/L (ref 22–32)
Calcium: 8.6 mg/dL — ABNORMAL LOW (ref 8.9–10.3)
Chloride: 107 mmol/L (ref 98–111)
Creatinine: 0.67 mg/dL (ref 0.44–1.00)
GFR, Estimated: >60 mL/min (ref >60)
Glucose, Bld: 86 mg/dL (ref 70–99)
Potassium: 3.6 mmol/L (ref 3.5–5.1)
Sodium: 140 mmol/L (ref 135–145)
Total Bilirubin: 0.3 mg/dL (ref 0.3–1.2)
Total Protein: 6.9 g/dL (ref 6.5–8.1)

## 2021-06-14 MED ORDER — DIPHENHYDRAMINE HCL 25 MG PO CAPS
25.0000 mg | ORAL_CAPSULE | Freq: Once | ORAL | Status: AC
  Administered 2021-06-14: 25 mg via ORAL
  Filled 2021-06-14: qty 1

## 2021-06-14 MED ORDER — SODIUM CHLORIDE 0.9% FLUSH
10.0000 mL | Freq: Once | INTRAVENOUS | Status: AC
  Administered 2021-06-14: 10 mL via INTRAVENOUS

## 2021-06-14 MED ORDER — SODIUM CHLORIDE 0.9% FLUSH
10.0000 mL | INTRAVENOUS | Status: DC | PRN
  Administered 2021-06-14: 10 mL

## 2021-06-14 MED ORDER — SODIUM CHLORIDE 0.9 % IV SOLN
420.0000 mg | Freq: Once | INTRAVENOUS | Status: AC
  Administered 2021-06-14: 420 mg via INTRAVENOUS
  Filled 2021-06-14: qty 14

## 2021-06-14 MED ORDER — ACETAMINOPHEN 325 MG PO TABS
650.0000 mg | ORAL_TABLET | Freq: Once | ORAL | Status: AC
  Administered 2021-06-14: 650 mg via ORAL
  Filled 2021-06-14: qty 2

## 2021-06-14 MED ORDER — SODIUM CHLORIDE 0.9 % IV SOLN
Freq: Once | INTRAVENOUS | Status: AC
Start: 2021-06-14 — End: 2021-06-14

## 2021-06-14 MED ORDER — TRASTUZUMAB-ANNS CHEMO 150 MG IV SOLR
6.0000 mg/kg | Freq: Once | INTRAVENOUS | Status: AC
  Administered 2021-06-14: 441 mg via INTRAVENOUS
  Filled 2021-06-14: qty 21

## 2021-06-14 MED ORDER — HEPARIN SOD (PORK) LOCK FLUSH 100 UNIT/ML IV SOLN
500.0000 [IU] | Freq: Once | INTRAVENOUS | Status: AC | PRN
  Administered 2021-06-14: 500 [IU]

## 2021-06-14 NOTE — Patient Instructions (Signed)
[facility]  Discharge Instructions: Thank you for choosing [facility] to provide your oncology and hematology care.   If you have a lab appointment with the [facility], please go directly to the [facility] and check in at the registration area.   Wear comfortable clothing and clothing appropriate for easy access to any Portacath or PICC line.   We strive to give you quality time with your provider. You may need to reschedule your appointment if you arrive late (15 or more minutes).  Arriving late affects you and other patients whose appointments are after yours.  Also, if you miss three or more appointments without notifying the office, you may be dismissed from the clinic at the provider's discretion.      For prescription refill requests, have your pharmacy contact our office and allow 72 hours for refills to be completed.    Today you received the following chemotherapy and/or immunotherapy agents: trastuzumab and pertuzumab      To help prevent nausea and vomiting after your treatment, we encourage you to take your nausea medication as directed.  BELOW ARE SYMPTOMS THAT SHOULD BE REPORTED IMMEDIATELY: *FEVER GREATER THAN 100.4 F (38 C) OR HIGHER *CHILLS OR SWEATING *NAUSEA AND VOMITING THAT IS NOT CONTROLLED WITH YOUR NAUSEA MEDICATION *UNUSUAL SHORTNESS OF BREATH *UNUSUAL BRUISING OR BLEEDING *URINARY PROBLEMS (pain or burning when urinating, or frequent urination) *BOWEL PROBLEMS (unusual diarrhea, constipation, pain near the anus) TENDERNESS IN MOUTH AND THROAT WITH OR WITHOUT PRESENCE OF ULCERS (sore throat, sores in mouth, or a toothache) UNUSUAL RASH, SWELLING OR PAIN  UNUSUAL VAGINAL DISCHARGE OR ITCHING   Items with * indicate a potential emergency and should be followed up as soon as possible or go to the Emergency Department if any problems should occur.  Please show the CHEMOTHERAPY ALERT CARD or IMMUNOTHERAPY ALERT  CARD at check-in to the Emergency Department and triage nurse.  Should you have questions after your visit or need to cancel or reschedule your appointment, please contact [facility]  Dept: [phone]  and follow the prompts.  Office hours are 8:00 a.m. to 4:30 p.m. Monday - Friday. Please note that voicemails left after 4:00 p.m. may not be returned until the following business day.  We are closed weekends and major holidays. You have access to a nurse at all times for urgent questions. Please call the main number to the clinic Dept: [phone] and follow the prompts.   For any non-urgent questions, you may also contact your provider using MyChart. We now offer e-Visits for anyone 16 and older to request care online for non-urgent symptoms. For details visit [email or web address].   Also download the MyChart app! Go to the app store, search "MyChart", open the app, select [facility], and log in with your MyChart username and password.  Due to Covid, a mask is required upon entering the hospital/clinic. If you do not have a mask, one will be given to you upon arrival. For doctor visits, patients may have 1 support person aged 57 or older with them. For treatment visits, patients cannot have anyone with them due to current Covid guidelines and our immunocompromised population.

## 2021-06-14 NOTE — Progress Notes (Signed)
Pt declined to stay for 30 minute observation after infusion

## 2021-06-15 ENCOUNTER — Ambulatory Visit
Admission: RE | Admit: 2021-06-15 | Discharge: 2021-06-15 | Disposition: A | Discharge status: Home / Self Care | Payer: Managed Care, Other (non HMO) | Source: Ambulatory Visit | Attending: Radiation Oncology | Admitting: Radiation Oncology

## 2021-06-15 DIAGNOSIS — C50512 Malignant neoplasm of lower-outer quadrant of left female breast: Secondary | ICD-10-CM | POA: Diagnosis present

## 2021-06-15 DIAGNOSIS — Z171 Estrogen receptor negative status [ER-]: Secondary | ICD-10-CM | POA: Diagnosis not present

## 2021-06-16 ENCOUNTER — Other Ambulatory Visit: Payer: Self-pay

## 2021-06-16 ENCOUNTER — Ambulatory Visit
Admission: RE | Admit: 2021-06-16 | Discharge: 2021-06-16 | Disposition: A | Discharge status: Home / Self Care | Payer: Managed Care, Other (non HMO) | Source: Ambulatory Visit | Attending: Radiation Oncology | Admitting: Radiation Oncology

## 2021-06-16 DIAGNOSIS — C50512 Malignant neoplasm of lower-outer quadrant of left female breast: Secondary | ICD-10-CM | POA: Diagnosis not present

## 2021-06-17 ENCOUNTER — Ambulatory Visit
Admission: RE | Admit: 2021-06-17 | Discharge: 2021-06-17 | Disposition: A | Discharge status: Home / Self Care | Payer: Managed Care, Other (non HMO) | Source: Ambulatory Visit | Attending: Radiation Oncology | Admitting: Radiation Oncology

## 2021-06-17 DIAGNOSIS — C50512 Malignant neoplasm of lower-outer quadrant of left female breast: Secondary | ICD-10-CM | POA: Diagnosis not present

## 2021-06-18 ENCOUNTER — Other Ambulatory Visit: Payer: Self-pay

## 2021-06-18 ENCOUNTER — Ambulatory Visit
Admission: RE | Admit: 2021-06-18 | Discharge: 2021-06-18 | Disposition: A | Discharge status: Home / Self Care | Payer: Managed Care, Other (non HMO) | Source: Ambulatory Visit | Attending: Radiation Oncology | Admitting: Radiation Oncology

## 2021-06-18 DIAGNOSIS — C50512 Malignant neoplasm of lower-outer quadrant of left female breast: Secondary | ICD-10-CM | POA: Diagnosis not present

## 2021-06-21 ENCOUNTER — Other Ambulatory Visit: Payer: Self-pay

## 2021-06-21 ENCOUNTER — Ambulatory Visit
Admission: RE | Admit: 2021-06-21 | Discharge: 2021-06-21 | Disposition: A | Discharge status: Home / Self Care | Payer: Managed Care, Other (non HMO) | Source: Ambulatory Visit | Attending: Radiation Oncology | Admitting: Radiation Oncology

## 2021-06-21 DIAGNOSIS — C50512 Malignant neoplasm of lower-outer quadrant of left female breast: Secondary | ICD-10-CM | POA: Diagnosis not present

## 2021-06-22 ENCOUNTER — Ambulatory Visit
Admission: RE | Admit: 2021-06-22 | Discharge: 2021-06-22 | Disposition: A | Discharge status: Home / Self Care | Payer: Managed Care, Other (non HMO) | Source: Ambulatory Visit | Attending: Radiation Oncology | Admitting: Radiation Oncology

## 2021-06-22 ENCOUNTER — Ambulatory Visit: Payer: Managed Care, Other (non HMO) | Admitting: Radiation Oncology

## 2021-06-22 DIAGNOSIS — C50512 Malignant neoplasm of lower-outer quadrant of left female breast: Secondary | ICD-10-CM | POA: Diagnosis not present

## 2021-06-23 ENCOUNTER — Ambulatory Visit
Admission: RE | Admit: 2021-06-23 | Discharge: 2021-06-23 | Disposition: A | Discharge status: Home / Self Care | Payer: Managed Care, Other (non HMO) | Source: Ambulatory Visit | Attending: Radiation Oncology | Admitting: Radiation Oncology

## 2021-06-23 ENCOUNTER — Other Ambulatory Visit: Payer: Self-pay

## 2021-06-23 DIAGNOSIS — C50512 Malignant neoplasm of lower-outer quadrant of left female breast: Secondary | ICD-10-CM | POA: Diagnosis not present

## 2021-06-24 ENCOUNTER — Ambulatory Visit
Admission: RE | Admit: 2021-06-24 | Discharge: 2021-06-24 | Disposition: A | Discharge status: Home / Self Care | Payer: Managed Care, Other (non HMO) | Source: Ambulatory Visit | Attending: Radiation Oncology | Admitting: Radiation Oncology

## 2021-06-24 DIAGNOSIS — C50512 Malignant neoplasm of lower-outer quadrant of left female breast: Secondary | ICD-10-CM | POA: Diagnosis not present

## 2021-06-25 ENCOUNTER — Ambulatory Visit
Admission: RE | Admit: 2021-06-25 | Discharge: 2021-06-25 | Disposition: A | Discharge status: Home / Self Care | Payer: Managed Care, Other (non HMO) | Source: Ambulatory Visit | Attending: Radiation Oncology | Admitting: Radiation Oncology

## 2021-06-25 ENCOUNTER — Other Ambulatory Visit: Payer: Self-pay

## 2021-06-25 DIAGNOSIS — C50512 Malignant neoplasm of lower-outer quadrant of left female breast: Secondary | ICD-10-CM | POA: Diagnosis not present

## 2021-06-28 ENCOUNTER — Ambulatory Visit
Admission: RE | Admit: 2021-06-28 | Discharge: 2021-06-28 | Disposition: A | Discharge status: Home / Self Care | Payer: Managed Care, Other (non HMO) | Source: Ambulatory Visit | Attending: Radiation Oncology | Admitting: Radiation Oncology

## 2021-06-28 ENCOUNTER — Other Ambulatory Visit: Payer: Self-pay

## 2021-06-28 DIAGNOSIS — C50512 Malignant neoplasm of lower-outer quadrant of left female breast: Secondary | ICD-10-CM | POA: Diagnosis not present

## 2021-06-29 ENCOUNTER — Ambulatory Visit
Admission: RE | Admit: 2021-06-29 | Discharge: 2021-06-29 | Disposition: A | Discharge status: Home / Self Care | Payer: Managed Care, Other (non HMO) | Source: Ambulatory Visit | Attending: Radiation Oncology | Admitting: Radiation Oncology

## 2021-06-29 DIAGNOSIS — C50512 Malignant neoplasm of lower-outer quadrant of left female breast: Secondary | ICD-10-CM | POA: Diagnosis not present

## 2021-06-30 ENCOUNTER — Other Ambulatory Visit: Payer: Self-pay

## 2021-06-30 ENCOUNTER — Ambulatory Visit
Admission: RE | Admit: 2021-06-30 | Discharge: 2021-06-30 | Disposition: A | Discharge status: Home / Self Care | Payer: Managed Care, Other (non HMO) | Source: Ambulatory Visit | Attending: Radiation Oncology | Admitting: Radiation Oncology

## 2021-06-30 DIAGNOSIS — C50512 Malignant neoplasm of lower-outer quadrant of left female breast: Secondary | ICD-10-CM | POA: Diagnosis not present

## 2021-07-01 ENCOUNTER — Encounter: Payer: Self-pay | Admitting: *Deleted

## 2021-07-01 ENCOUNTER — Ambulatory Visit
Admission: RE | Admit: 2021-07-01 | Discharge: 2021-07-01 | Disposition: A | Discharge status: Home / Self Care | Payer: Managed Care, Other (non HMO) | Source: Ambulatory Visit | Attending: Radiation Oncology | Admitting: Radiation Oncology

## 2021-07-01 ENCOUNTER — Encounter: Payer: Self-pay | Admitting: Radiation Oncology

## 2021-07-01 DIAGNOSIS — C50512 Malignant neoplasm of lower-outer quadrant of left female breast: Secondary | ICD-10-CM | POA: Diagnosis not present

## 2021-07-05 ENCOUNTER — Ambulatory Visit: Payer: Managed Care, Other (non HMO)

## 2021-07-05 ENCOUNTER — Other Ambulatory Visit: Payer: Managed Care, Other (non HMO)

## 2021-07-05 ENCOUNTER — Ambulatory Visit: Payer: Managed Care, Other (non HMO) | Admitting: Hematology and Oncology

## 2021-07-06 ENCOUNTER — Telehealth: Payer: Self-pay | Admitting: *Deleted

## 2021-07-06 NOTE — Telephone Encounter (Signed)
CALLED PATIENT TO ALTER FU ON 08-05-21 DUE TO DR. KINARD BEING ON VACATION, RESCHEDULED FOR 08-12-21 @ 8:45 AM , PATIENT AGREED TO NEW DATE AND TIME

## 2021-07-09 ENCOUNTER — Other Ambulatory Visit: Payer: Self-pay

## 2021-07-09 DIAGNOSIS — Z171 Estrogen receptor negative status [ER-]: Secondary | ICD-10-CM

## 2021-07-09 DIAGNOSIS — C50512 Malignant neoplasm of lower-outer quadrant of left female breast: Secondary | ICD-10-CM

## 2021-07-11 NOTE — Progress Notes (Signed)
Patient Care Team: Alroy Dust, L.Marlou Sa, MD as PCP - General (Family Medicine) Jovita Kussmaul, MD as Consulting Physician (General Surgery) Nicholas Lose, MD as Consulting Physician (Hematology and Oncology) Gery Pray, MD as Consulting Physician (Radiation Oncology) Mauro Kaufmann, RN as Oncology Nurse Navigator Rockwell Germany, RN as Oncology Nurse Navigator  DIAGNOSIS:    ICD-10-CM   1. Malignant neoplasm of lower-outer quadrant of left breast of female, estrogen receptor negative ([place])  C50.512    Z17.1       SUMMARY OF ONCOLOGIC HISTORY: Oncology History  Malignant neoplasm of lower-outer quadrant of left breast of female, estrogen receptor negative ([place])  11/04/2020 Initial Diagnosis   Screening mammogram showed a possible left breast mass and enlarged axillary lymph nodes. Diagnostic mammogram and US showed a 1.4cm mass at the 3:30 position in the left breast and 2 abnormal lymph nodes in the left axilla. 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   11/11/2020 Cancer Staging   Staging form: Breast, AJCC 8th Edition - Clinical stage from 11/11/2020: Stage IIA (cT1c, cN1, cM0, G3, ER-, PR-, HER2+) - Signed by Nicholas Lose, MD on 11/11/2020 Stage prefix: Initial diagnosis Histologic grading system: 3 grade system Laterality: Left Staged by: Pathologist and managing physician Stage used in treatment planning: Yes National guidelines used in treatment planning: Yes Type of national guideline used in treatment planning: NCCN    11/27/2020 - 04/02/2021 Chemotherapy   TCHP x6 cycles       04/16/2021 Surgery   Left lumpectomy 04/16/2021: Pathologic complete response, 0/3 lymph nodes negative   05/03/2021 -  Chemotherapy   Patient is on Treatment Plan : BREAST Trastuzumab + Pertuzumab q21d       CHIEF COMPLIANT: Herceptin Perjeta maintenance  INTERVAL HISTORY: Kristina Harvey is a 54 y.o.  with above-mentioned history of breast cancer having completed neoadjuvant chemotherapy with [place] and radiation, currently on maintenance with Herceptin Perjeta. She presents to the clinic today for follow-up.   ALLERGIES:  is allergic to caffeine.  MEDICATIONS:  Current Outpatient Medications  Medication Sig Dispense Refill   acetaminophen (TYLENOL) 500 MG tablet Take 1,000 mg by mouth every 6 (six) hours as needed for moderate pain.     ALPRAZolam (XANAX) 0.25 MG tablet Take 0.25 mg by mouth at bedtime as needed for sleep. (Patient not taking: Reported on 05/10/2021)     colestipol (COLESTID) 1 g tablet Take 2 g by mouth at bedtime.     fluorouracil (EFUDEX) 5 % cream Apply 1 application topically daily. (Patient not taking: Reported on 05/10/2021)     levothyroxine (SYNTHROID) 75 MCG tablet Take 75 mcg by mouth daily before breakfast.     metoprolol succinate (TOPROL-XL) 25 MG 24 hr tablet Take 1 tablet (25 mg total) by mouth daily. PLEASE SCHEDULE OFFICE VISIT FOR FURTHER REFILLS. THANK YOU! 90 tablet 0   metoprolol tartrate (LOPRESSOR) 25 MG tablet Take 25 mg by mouth daily as needed (palpitations).     Multiple Vitamin (MULTIVITAMIN WITH MINERALS) TABS tablet Take 1 tablet by mouth daily.     Polyethyl Glycol-Propyl Glycol (SYSTANE OP) Place 1 drop into both eyes 2 (two) times daily.     Probiotic Product (PROBIOTIC PO) Take 1 capsule by mouth daily.     Psyllium (METAMUCIL PO) Take 1 Scoop by mouth daily.     No current facility-administered medications for this visit.    PHYSICAL  EXAMINATION: ECOG PERFORMANCE STATUS: 1 - Symptomatic but completely ambulatory  Vitals:   07/12/21 1036  BP: 136/71  Pulse: (!) 108  Resp: 17  Temp: (!) 97.5 F (36.4 C)  SpO2: 99%   Filed Weights   07/12/21 1036  Weight: 162 lb 11.2 oz (73.8 kg)    LABORATORY DATA:  I have reviewed the data as listed CMP Latest Ref Rng & Units 07/12/2021 06/14/2021 05/24/2021  Glucose 70 - 99 mg/dL  116(H) 86 71  BUN 6 - 20 mg/dL 16 12 10   Creatinine 0.44 - 1.00 mg/dL 0.72 0.67 0.66  Sodium 135 - 145 mmol/L 141 140 141  Potassium 3.5 - 5.1 mmol/L 4.4 3.6 3.9  Chloride 98 - 111 mmol/L 105 107 107  CO2 22 - 32 mmol/L 26 24 25   Calcium 8.9 - 10.3 mg/dL 9.2 8.6(L) 9.2  Total Protein 6.5 - 8.1 g/dL 7.5 6.9 6.9  Total Bilirubin 0.3 - 1.2 mg/dL 0.7 0.3 0.3  Alkaline Phos 38 - 126 U/L 72 61 60  AST 15 - 41 U/L 25 17 19   ALT 0 - 44 U/L 24 14 16     Lab Results  Component Value Date   WBC 5.4 07/12/2021   HGB 13.2 07/12/2021   HCT 38.0 07/12/2021   MCV 98.7 07/12/2021   PLT 191 07/12/2021   NEUTROABS 4.1 07/12/2021    ASSESSMENT & PLAN:  Malignant neoplasm of lower-outer quadrant of left breast of female, estrogen receptor negative ([place]) 11/04/2020:Screening mammogram showed a possible left breast mass and enlarged axillary lymph nodes. Diagnostic mammogram and US showed a 1.4cm mass at the 3:30 position in the left breast and 2 abnormal lymph nodes in the left axilla. 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   Treatment Plan: 1. Neoadjuvant chemotherapy with TCH Perjeta 6 cycles completed 03/12/2021 followed by Herceptin Perjeta maintenance versus Kadcyla maintenance (based on response to neoadjuvant chemo) for 1 year 2. Left lumpectomy 04/16/2021: Pathologic complete response, 0/3 lymph nodes negative 3. Followed by adjuvant radiation therapy 05/19/2021-07/01/2021 4.  Consideration for neratinib ------------------------------------------------------------------------------------------------------------------------------- Current Treatment: Completed 6 cycles of (11/27/20-03/12/2021), currently on Herceptin Perjeta maintenance Herceptin Perjeta toxicities: She has significant improvement in the symptoms.  Diarrhea is down to 3 times a day mostly soft stools.   Return to clinic for Herceptin Perjeta  maintenance We discussed the role of neratinib briefly.  She is concerned about risk of recurrence.  If she decides to do it we will consider adding this after Herceptin Perjeta maintenance is completed. I recommended that we get a mammogram in March 2023.   Every 3 weeks for Herceptin Perjeta every 6 weeks to follow-up with me.      No orders of the defined types were placed in this encounter.  The patient has a good understanding of the overall plan. she agrees with it. she will call with any problems that may develop before the next visit here.  Total time spent: 30 mins including face to face time and time spent for planning, charting and coordination of care  Rulon Eisenmenger, MD, MPH 07/12/2021  I, Thana Ates, am acting as scribe for Dr. Nicholas Lose.  I have reviewed the above documentation for accuracy and completeness, and I agree with the above.

## 2021-07-11 NOTE — Assessment & Plan Note (Signed)
11/04/2020:Screening mammogram showed a possible left breast mass and enlarged axillary lymph nodes. Diagnostic mammogram and US showed a 1.4cm mass at the 3:30 position in the left breast and 2 abnormal lymph nodes in the left axilla. 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  Treatment Plan: 1.Neoadjuvant chemotherapy with [place] Perjeta 6 cyclescompleted 7/29/2022followed by Herceptin Perjeta maintenance versus Kadcyla maintenance (based on response to neoadjuvant chemo) for 1 year 2. Left lumpectomy 04/16/2021: Pathologic complete response, 0/3 lymph nodes negative 3. Followed by adjuvant radiation therapy 05/19/2021-07/01/2021 4.Consideration for neratinib ------------------------------------------------------------------------------------------------------------------------------- Current Treatment:Completed 6 cycles of(11/27/20-03/12/2021), currently on Herceptin Perjeta maintenance Herceptin Perjeta toxicities: Alternating diarrhea with constipation.  When she gets diarrhea she goes up to 6 times a day.  Return to clinicforHerceptin Perjeta maintenance Ongoing radiation  Every 3 weeks for Herceptin Perjeta every 6 weeks to follow-up with me.

## 2021-07-12 ENCOUNTER — Other Ambulatory Visit: Payer: Self-pay

## 2021-07-12 ENCOUNTER — Inpatient Hospital Stay: Payer: Managed Care, Other (non HMO)

## 2021-07-12 ENCOUNTER — Inpatient Hospital Stay: Payer: Managed Care, Other (non HMO) | Attending: Hematology and Oncology | Admitting: Hematology and Oncology

## 2021-07-12 VITALS — BP 135/73 | HR 79 | Resp 18

## 2021-07-12 DIAGNOSIS — C50512 Malignant neoplasm of lower-outer quadrant of left female breast: Secondary | ICD-10-CM | POA: Diagnosis present

## 2021-07-12 DIAGNOSIS — Z5112 Encounter for antineoplastic immunotherapy: Secondary | ICD-10-CM | POA: Diagnosis present

## 2021-07-12 DIAGNOSIS — Z171 Estrogen receptor negative status [ER-]: Secondary | ICD-10-CM | POA: Diagnosis not present

## 2021-07-12 DIAGNOSIS — Z95828 Presence of other vascular implants and grafts: Secondary | ICD-10-CM

## 2021-07-12 LAB — COMPREHENSIVE METABOLIC PANEL
ALT: 24 U/L (ref 0–44)
AST: 25 U/L (ref 15–41)
Albumin: 3.8 g/dL (ref 3.5–5.0)
Alkaline Phosphatase: 72 U/L (ref 38–126)
Anion gap: 10 (ref 5–15)
BUN: 16 mg/dL (ref 6–20)
CO2: 26 mmol/L (ref 22–32)
Calcium: 9.2 mg/dL (ref 8.9–10.3)
Chloride: 105 mmol/L (ref 98–111)
Creatinine, Ser: 0.72 mg/dL (ref 0.44–1.00)
GFR, Estimated: >60 mL/min (ref >60)
Glucose, Bld: 116 mg/dL — ABNORMAL HIGH (ref 70–99)
Potassium: 4.4 mmol/L (ref 3.5–5.1)
Sodium: 141 mmol/L (ref 135–145)
Total Bilirubin: 0.7 mg/dL (ref 0.3–1.2)
Total Protein: 7.5 g/dL (ref 6.5–8.1)

## 2021-07-12 LAB — CBC WITH DIFFERENTIAL/PLATELET
Abs Immature Granulocytes: 0.02 10*3/uL (ref 0.00–0.07)
Basophils Absolute: 0 10*3/uL (ref 0.0–0.1)
Basophils Relative: 0 %
Eosinophils Absolute: 0.1 10*3/uL (ref 0.0–0.5)
Eosinophils Relative: 1 %
HCT: 38 % (ref 36.0–46.0)
Hemoglobin: 13.2 g/dL (ref 12.0–15.0)
Immature Granulocytes: 0 %
Lymphocytes Relative: 12 %
Lymphs Abs: 0.6 10*3/uL — ABNORMAL LOW (ref 0.7–4.0)
MCH: 34.3 pg — ABNORMAL HIGH (ref 26.0–34.0)
MCHC: 34.7 g/dL (ref 30.0–36.0)
MCV: 98.7 fL (ref 80.0–100.0)
Monocytes Absolute: 0.6 10*3/uL (ref 0.1–1.0)
Monocytes Relative: 11 %
Neutro Abs: 4.1 10*3/uL (ref 1.7–7.7)
Neutrophils Relative %: 76 %
Platelets: 191 10*3/uL (ref 150–400)
RBC: 3.85 MIL/uL — ABNORMAL LOW (ref 3.87–5.11)
RDW: 12 % (ref 11.5–15.5)
WBC: 5.4 10*3/uL (ref 4.0–10.5)
nRBC: 0 % (ref 0.0–0.2)

## 2021-07-12 MED ORDER — TRASTUZUMAB-ANNS CHEMO 150 MG IV SOLR
6.0000 mg/kg | Freq: Once | INTRAVENOUS | Status: AC
  Administered 2021-07-12: 12:00:00 441 mg via INTRAVENOUS
  Filled 2021-07-12: qty 21

## 2021-07-12 MED ORDER — SODIUM CHLORIDE 0.9 % IV SOLN
420.0000 mg | Freq: Once | INTRAVENOUS | Status: AC
  Administered 2021-07-12: 420 mg via INTRAVENOUS
  Filled 2021-07-12: qty 14

## 2021-07-12 MED ORDER — SODIUM CHLORIDE 0.9% FLUSH
10.0000 mL | INTRAVENOUS | Status: DC | PRN
  Administered 2021-07-12: 13:00:00 10 mL

## 2021-07-12 MED ORDER — DIPHENHYDRAMINE HCL 25 MG PO CAPS
25.0000 mg | ORAL_CAPSULE | Freq: Once | ORAL | Status: AC
  Administered 2021-07-12: 12:00:00 25 mg via ORAL
  Filled 2021-07-12 (×2): qty 1

## 2021-07-12 MED ORDER — ACETAMINOPHEN 325 MG PO TABS
650.0000 mg | ORAL_TABLET | Freq: Once | ORAL | Status: AC
  Administered 2021-07-12: 12:00:00 650 mg via ORAL
  Filled 2021-07-12: qty 2

## 2021-07-12 MED ORDER — HEPARIN SOD (PORK) LOCK FLUSH 100 UNIT/ML IV SOLN
500.0000 [IU] | Freq: Once | INTRAVENOUS | Status: AC | PRN
  Administered 2021-07-12: 13:00:00 500 [IU]

## 2021-07-12 MED ORDER — SODIUM CHLORIDE 0.9 % IV SOLN: Freq: Once | INTRAVENOUS | Status: AC

## 2021-07-12 MED ORDER — SODIUM CHLORIDE 0.9% FLUSH
10.0000 mL | Freq: Once | INTRAVENOUS | Status: AC
Start: 2021-07-12 — End: 2021-07-12
  Administered 2021-07-12: 10:00:00 10 mL

## 2021-07-12 NOTE — Patient Instructions (Signed)
[facility]  Discharge Instructions: Thank you for choosing [facility] to provide your oncology and hematology care.   If you have a lab appointment with the [facility], please go directly to the [facility] and check in at the registration area.   Wear comfortable clothing and clothing appropriate for easy access to any Portacath or PICC line.   We strive to give you quality time with your provider. You may need to reschedule your appointment if you arrive late (15 or more minutes).  Arriving late affects you and other patients whose appointments are after yours.  Also, if you miss three or more appointments without notifying the office, you may be dismissed from the clinic at the provider's discretion.      For prescription refill requests, have your pharmacy contact our office and allow 72 hours for refills to be completed.    Today you received the following chemotherapy and/or immunotherapy agents: trastuzumab/pertuzumab      To help prevent nausea and vomiting after your treatment, we encourage you to take your nausea medication as directed.  BELOW ARE SYMPTOMS THAT SHOULD BE REPORTED IMMEDIATELY: *FEVER GREATER THAN 100.4 F (38 C) OR HIGHER *CHILLS OR SWEATING *NAUSEA AND VOMITING THAT IS NOT CONTROLLED WITH YOUR NAUSEA MEDICATION *UNUSUAL SHORTNESS OF BREATH *UNUSUAL BRUISING OR BLEEDING *URINARY PROBLEMS (pain or burning when urinating, or frequent urination) *BOWEL PROBLEMS (unusual diarrhea, constipation, pain near the anus) TENDERNESS IN MOUTH AND THROAT WITH OR WITHOUT PRESENCE OF ULCERS (sore throat, sores in mouth, or a toothache) UNUSUAL RASH, SWELLING OR PAIN  UNUSUAL VAGINAL DISCHARGE OR ITCHING   Items with * indicate a potential emergency and should be followed up as soon as possible or go to the Emergency Department if any problems should occur.  Please show the CHEMOTHERAPY ALERT CARD or IMMUNOTHERAPY ALERT CARD at  check-in to the Emergency Department and triage nurse.  Should you have questions after your visit or need to cancel or reschedule your appointment, please contact [facility]  Dept: [phone]  and follow the prompts.  Office hours are 8:00 a.m. to 4:30 p.m. Monday - Friday. Please note that voicemails left after 4:00 p.m. may not be returned until the following business day.  We are closed weekends and major holidays. You have access to a nurse at all times for urgent questions. Please call the main number to the clinic Dept: [phone] and follow the prompts.   For any non-urgent questions, you may also contact your provider using MyChart. We now offer e-Visits for anyone 66 and older to request care online for non-urgent symptoms. For details visit [email or web address].   Also download the MyChart app! Go to the app store, search "MyChart", open the app, select [facility], and log in with your MyChart username and password.  Due to Covid, a mask is required upon entering the hospital/clinic. If you do not have a mask, one will be given to you upon arrival. For doctor visits, patients may have 1 support person aged 61 or older with them. For treatment visits, patients cannot have anyone with them due to current Covid guidelines and our immunocompromised population.

## 2021-07-19 NOTE — Progress Notes (Signed)
Cardiology Office Note  Date:  07/21/2021   ID:  Kristina Harvey, DOB [birth year], MRN [id number]  PCP:  Aurea Graff.Kristina Sa, MD   Chief Complaint  Patient presents with   12 month follow up     Patient c/o rapid heartbeats at times. Medications reviewed by the patient verbally.     HPI:  Kristina Harvey is a 55 y.o. female with past medical history of Paroxysmal tachycardia dating back several years Periodic alcohol Snoring, unable to exclude sleep apnea Hyperlipidemia Breast cancer Presents for  atrial fibrillation  Last seen by myself 11/21 Diagnosed with breast cancer , completed chemo, XRT 33 rounds Still recovering [place]  Reports having rare tachycardia episodes, possibly 2 since last clinic visit Continues to take metoprolol  Cardiac risk factors Nonsmoker No diabetes  Echo 10/22 reviewed 1. Left ventricular ejection fraction, by estimation, is 60 to 65%. The  left ventricle has normal function. The left ventricle has no regional  wall motion abnormalities. Left ventricular diastolic parameters were  normal. The average left ventricular  global longitudinal strain is -19.2 %. The global longitudinal strain is  normal.   2. Right ventricular systolic function is normal. The right ventricular  size is normal. There is normal pulmonary artery systolic pressure. The  estimated right ventricular systolic pressure is 78.2 mmHg.   3. The mitral valve is grossly normal. Trivial mitral valve  regurgitation. No evidence of mitral stenosis.   4. The aortic valve is grossly normal. Unable to visualize aortic valve  morphology due to image quality. Aortic valve regurgitation is not  visualized. No aortic stenosis is present.   5. The inferior vena cava is normal in size with greater than 50%  respiratory variability, suggesting right atrial pressure of 3 mmHg.   Prior history of IBS, treated with colestipol  EKG personally reviewed by myself on todays  visit Shows normal sinus rhythm rate 83 bpm no significant ST-T wave changes   Lab work reviewed from primary care total cholesterol 230, LDL 143 August 2021  Since then she reports she has started the colestipol  Scheduled for repeat lab work  prior episode; Felt lightheaded getting out of the car with some diaphoresis Went to urgent care EKG documented atrial fibrillation, transferred by EMS to the emergency room On route was given medication Converted to normal sinus rhythm by the time she got to the emergency room A. fib lasting 2 hours  Husband reported that she has snoring  paroxysmal tachycardia dating back several years  CT abdomen pelvis 2017 images pulled up and reviewed  showing no significant aortic atherosclerosis   PMH:   has a past medical history of Arrhythmia, Cancer ([place]), Family history of breast cancer, Family history of prostate cancer, Family history of stomach cancer, Hyperlipidemia, Hypothyroidism, and Thyroid disease.  PSH:    Past Surgical History:  Procedure Laterality Date   BREAST LUMPECTOMY WITH RADIOACTIVE SEED AND SENTINEL LYMPH NODE BIOPSY Left 04/16/2021   Procedure: LEFT BREAST LUMPECTOMY WITH RADIOACTIVE SEED AND SENTINEL LYMPH NODE BIOPSY;  Surgeon: Jovita Kussmaul, MD;  Location: [facility];  Service: General;  Laterality: Left;   chin implant  1996   IR IMAGING GUIDED PORT INSERTION  11/24/2020   RADIOACTIVE SEED GUIDED AXILLARY SENTINEL LYMPH NODE Left 04/16/2021   Procedure: RADIOACTIVE SEED GUIDED AXILLARY SENTINEL LYMPH NODE DISSECTION;  Surgeon: Jovita Kussmaul, MD;  Location: [facility];  Service: General;  Laterality: Left;    Current Outpatient  Medications  Medication Sig Dispense Refill   acetaminophen (TYLENOL) 500 MG tablet Take 1,000 mg by mouth every 6 (six) hours as needed for moderate pain.     ALPRAZolam (XANAX) 0.25 MG tablet Take 0.25 mg by mouth at bedtime as needed for sleep.     colestipol (COLESTID) 1 g tablet Take 2 g by mouth at  bedtime.     levothyroxine (SYNTHROID) 75 MCG tablet Take 75 mcg by mouth daily before breakfast.     Multiple Vitamin (MULTIVITAMIN WITH MINERALS) TABS tablet Take 1 tablet by mouth daily.     Polyethyl Glycol-Propyl Glycol (SYSTANE OP) Place 1 drop into both eyes 2 (two) times daily.     Probiotic Product (PROBIOTIC PO) Take 1 capsule by mouth daily.     Psyllium (METAMUCIL PO) Take 1 Scoop by mouth daily.     fluorouracil (EFUDEX) 5 % cream Apply 1 application topically daily. (Patient not taking: Reported on 07/20/2021)     metoprolol succinate (TOPROL-XL) 25 MG 24 hr tablet Take 1 tablet (25 mg total) by mouth daily. 90 tablet 3   metoprolol tartrate (LOPRESSOR) 25 MG tablet Take 1 tablet (25 mg total) by mouth 2 (two) times daily as needed (palpitations). 60 tablet 3   No current facility-administered medications for this visit.     Allergies:   Caffeine   Social History:  The patient  reports that she has never smoked. She has never used smokeless tobacco. She reports current alcohol use. She reports that she does not use drugs.   Family History:   family history includes Atrial fibrillation in her father; Breast cancer (age of onset: 45) in her maternal grandmother; Breast cancer (age of onset: 19) in her mother; Breast cancer (age of onset: 52) in her maternal aunt; Breast cancer (age of onset: 68) in her paternal grandmother; Heart Problems in her maternal aunt; Prostate cancer (age of onset: 67) in her maternal uncle; Stomach cancer (age of onset: 78) in her paternal grandfather.    Review of Systems: Review of Systems  Constitutional: Negative.   HENT: Negative.    Respiratory: Negative.    Cardiovascular: Negative.   Gastrointestinal: Negative.   Musculoskeletal: Negative.   Neurological: Negative.   Psychiatric/Behavioral: Negative.    All other systems reviewed and are negative.  PHYSICAL EXAM: VS:  BP 120/70 (BP Location: Left Arm, Patient Position: Sitting, Cuff  Size: Normal)   Pulse 82   Ht 5\' 7"  (1.702 m)   Wt 163 lb 6 oz (74.1 kg)   SpO2 98%   BMI 25.59 kg/m  , BMI Body mass index is 25.59 kg/m. Constitutional:  oriented to person, place, and time. No distress.  HENT:  Head: Grossly normal Eyes:  no discharge. No scleral icterus.  Neck: No JVD, no carotid bruits  Cardiovascular: Regular rate and rhythm, no murmurs appreciated Pulmonary/Chest: Clear to auscultation bilaterally, no wheezes or rails Abdominal: Soft.  no distension.  no tenderness.  Musculoskeletal: Normal range of motion Neurological:  normal muscle tone. Coordination normal. No atrophy Skin: Skin warm and dry Psychiatric: normal affect, pleasant  Recent Labs: 07/12/2021: ALT 24; BUN 16; Creatinine, Ser 0.72; Hemoglobin 13.2; Platelets 191; Potassium 4.4; Sodium 141    Lipid Panel No results found for: CHOL, HDL, LDLCALC, TRIG    Wt Readings from Last 3 Encounters:  07/20/21 163 lb 6 oz (74.1 kg)  07/12/21 162 lb 11.2 oz (73.8 kg)  06/14/21 159 lb 8 oz (72.3 kg)  ASSESSMENT AND PLAN:  Problem List Items Addressed This Visit       Cardiology Problems   Atrial fibrillation ([place]) - Primary   Relevant Medications   metoprolol tartrate (LOPRESSOR) 25 MG tablet   metoprolol succinate (TOPROL-XL) 25 MG 24 hr tablet   Other Relevant Orders   EKG 12-Lead   Other Visit Diagnoses     Mixed hyperlipidemia       Relevant Medications   metoprolol tartrate (LOPRESSOR) 25 MG tablet   metoprolol succinate (TOPROL-XL) 25 MG 24 hr tablet   Other Relevant Orders   EKG 12-Lead      Paroxysmal atrial fibrillation Continue current dose metoprolol succinate 25 milligrams daily Discussed that the dose could be increased for more frequent episodes of tachypalpitations Would use metoprolol tartrate as needed Currently not on anticoagulation, low CHADS VASC  Hyperlipidemia CT scan images reviewed showing no aortic atherosclerosis from top of kidneys through  pelvis She prefers diet and exercise for lipid control Discussed use of Zetia, even statins  Breast cancer, on left Has completed chemotherapy, radiation   Total encounter time more than 25 minutes  Greater than 50% was spent in counseling and coordination of care with the patient    Signed, Esmond Plants, M.D., Ph.D. [place], [place]

## 2021-07-20 ENCOUNTER — Other Ambulatory Visit: Payer: Self-pay

## 2021-07-20 ENCOUNTER — Encounter: Payer: Self-pay | Admitting: Cardiovascular Disease

## 2021-07-20 ENCOUNTER — Ambulatory Visit (INDEPENDENT_AMBULATORY_CARE_PROVIDER_SITE_OTHER): Payer: Managed Care, Other (non HMO) | Admitting: Cardiovascular Disease

## 2021-07-20 VITALS — BP 120/70 | HR 82 | Ht 67.0 in | Wt 163.4 lb

## 2021-07-20 DIAGNOSIS — I480 Paroxysmal atrial fibrillation: Secondary | ICD-10-CM

## 2021-07-20 DIAGNOSIS — E782 Mixed hyperlipidemia: Secondary | ICD-10-CM | POA: Diagnosis not present

## 2021-07-20 MED ORDER — METOPROLOL SUCCINATE ER 25 MG PO TB24: 25.0000 mg | ORAL_TABLET | Freq: Every day | ORAL | 3 refills | Status: DC

## 2021-07-20 MED ORDER — METOPROLOL TARTRATE 25 MG PO TABS: 25.0000 mg | ORAL_TABLET | Freq: Two times a day (BID) | ORAL | 3 refills | Status: DC | PRN

## 2021-07-20 NOTE — Patient Instructions (Addendum)
Medication Instructions:  No changes  If you need a refill on your cardiac medications before your next appointment, please call your pharmacy.   Lab work: No new labs needed  Testing/Procedures: No new testing needed  Follow-Up: At [facility], you and your health needs are our priority.  As part of our continuing mission to provide you with exceptional heart care, we have created designated Provider Care Teams.  These Care Teams include your primary Cardiologist (physician) and Advanced Practice Providers (APPs -  Physician Assistants and Nurse Practitioners) who all work together to provide you with the care you need, when you need it.  You will need a follow up appointment in 12 months  Providers on your designated Care Team:   Christopher Berge, NP Ryan Dunn, PA-C Cadence Furth, PA-C  COVID-19 Vaccine Information can be found at: [email or web address] For questions related to vaccine distribution or appointments, please email [email or web address] or call [phone].   

## 2021-07-26 ENCOUNTER — Other Ambulatory Visit: Payer: Managed Care, Other (non HMO)

## 2021-07-26 ENCOUNTER — Other Ambulatory Visit: Payer: Self-pay

## 2021-07-26 ENCOUNTER — Ambulatory Visit: Payer: Managed Care, Other (non HMO)

## 2021-07-26 ENCOUNTER — Ambulatory Visit: Payer: Managed Care, Other (non HMO) | Attending: General Surgery | Admitting: Physical Therapy

## 2021-07-26 DIAGNOSIS — Z483 Aftercare following surgery for neoplasm: Secondary | ICD-10-CM | POA: Insufficient documentation

## 2021-07-26 NOTE — Therapy (Signed)
[facility] @ [address] [place], [place], [address] Phone: [phone]   Fax:  [phone]  Physical Therapy Treatment  Patient Details  Name: Kristina Harvey MRN: [id number] Date of Birth: June 07, 1966 Referring Provider (PT): Dr. Autumn Messing   Encounter Date: 07/26/2021   PT End of Session - 07/26/21 1229     Visit Number 2    Number of Visits 2    PT Start Time 1210    PT Stop Time 1218    PT Time Calculation (min) 8 min    Activity Tolerance Patient tolerated treatment well    Behavior During Therapy [facility] for tasks assessed/performed             Past Medical History:  Diagnosis Date   Arrhythmia    paroxysmal atrial fibrillation   Cancer [facility])    Family history of breast cancer    Family history of prostate cancer    Family history of stomach cancer    Hyperlipidemia    Hypothyroidism    Thyroid disease     Past Surgical History:  Procedure Laterality Date   BREAST LUMPECTOMY WITH RADIOACTIVE SEED AND SENTINEL LYMPH NODE BIOPSY Left 04/16/2021   Procedure: LEFT BREAST LUMPECTOMY WITH RADIOACTIVE SEED AND SENTINEL LYMPH NODE BIOPSY;  Surgeon: Jovita Kussmaul, MD;  Location: [place];  Service: General;  Laterality: Left;   chin implant  1996   IR IMAGING GUIDED PORT INSERTION  11/24/2020   RADIOACTIVE SEED GUIDED AXILLARY SENTINEL LYMPH NODE Left 04/16/2021   Procedure: RADIOACTIVE SEED GUIDED AXILLARY SENTINEL LYMPH NODE DISSECTION;  Surgeon: Jovita Kussmaul, MD;  Location: [place];  Service: General;  Laterality: Left;    There were no vitals filed for this visit.   Subjective Assessment - 07/26/21 1227     Subjective Pt here for her SOZO screen. She also c/o her heart racing. Encouraged her to do some deep breathing and contact MD if needed.    Pertinent History Patient was diagnosed on 10/14/2020 with left grade III invasive ductal carcinoma. She  underwent neoadjuvant chemotherapy from 11/27/220-03/12/2021 followed  by a left lumpectomy and sentinel node biopsy (3 negative nodes) on 04/16/2021. It is ER/PR negative and HER2 positive with a Ki67 of 20%.                    L-DEX FLOWSHEETS - 07/26/21 1200       L-DEX LYMPHEDEMA SCREENING   Measurement Type Unilateral    L-DEX MEASUREMENT EXTREMITY Upper Extremity    POSITION  Standing    DOMINANT SIDE Right    At Risk Side Left    BASELINE SCORE (UNILATERAL) -7.3    L-DEX SCORE (UNILATERAL) -2.4    VALUE CHANGE (UNILAT) 4.9                                     PT Long Term Goals - 05/06/21 1156       PT LONG TERM GOAL #1   Title Patient will demonstrate she has regained full shoulder ROM and function post operatively compared to baselines.    Time 6    Period Months    Status Achieved                   Plan - 07/26/21 1229     Clinical Impression Statement SOZO is within recommended range with  increase of 4.9    PT Next Visit Plan Continue SOZO screens    Consulted and Agree with Plan of Care Patient             Patient will benefit from skilled therapeutic intervention in order to improve the following deficits and impairments:     Visit Diagnosis: Aftercare following surgery for neoplasm     Problem List Patient Active Problem List   Diagnosis Date Noted   Port-A-Cath in place 01/07/2021   Family history of breast cancer    Family history of prostate cancer    Family history of stomach cancer    Malignant neoplasm of lower-outer quadrant of left breast of female, estrogen receptor negative ([place]) 11/09/2020   Atrial fibrillation ([place]) 10/01/2019   Disorder of thyroid gland 10/01/2019   Genetic testing 09/22/2017   Annia Friendly, PT 07/26/21 12:30 PM   [facility] @ [address] [place], [place], [address] Phone: [phone]   Fax:  [phone]  Name: Kristina Harvey MRN: [id number] Date of Birth:  January 31, 1966

## 2021-08-02 ENCOUNTER — Inpatient Hospital Stay: Payer: Managed Care, Other (non HMO) | Attending: Hematology and Oncology

## 2021-08-02 ENCOUNTER — Other Ambulatory Visit: Payer: Self-pay

## 2021-08-02 ENCOUNTER — Other Ambulatory Visit: Payer: Managed Care, Other (non HMO)

## 2021-08-02 VITALS — BP 110/94 | HR 98 | Temp 97.8°F | Resp 18

## 2021-08-02 DIAGNOSIS — C50512 Malignant neoplasm of lower-outer quadrant of left female breast: Secondary | ICD-10-CM | POA: Insufficient documentation

## 2021-08-02 DIAGNOSIS — Z5112 Encounter for antineoplastic immunotherapy: Secondary | ICD-10-CM | POA: Diagnosis not present

## 2021-08-02 DIAGNOSIS — Z171 Estrogen receptor negative status [ER-]: Secondary | ICD-10-CM | POA: Diagnosis not present

## 2021-08-02 MED ORDER — TRASTUZUMAB-ANNS CHEMO 150 MG IV SOLR
6.0000 mg/kg | Freq: Once | INTRAVENOUS | Status: AC
  Administered 2021-08-02: 10:00:00 441 mg via INTRAVENOUS
  Filled 2021-08-02: qty 21

## 2021-08-02 MED ORDER — HEPARIN SOD (PORK) LOCK FLUSH 100 UNIT/ML IV SOLN
500.0000 [IU] | Freq: Once | INTRAVENOUS | Status: AC | PRN
  Administered 2021-08-02: 11:00:00 500 [IU]

## 2021-08-02 MED ORDER — DIPHENHYDRAMINE HCL 25 MG PO CAPS
25.0000 mg | ORAL_CAPSULE | Freq: Once | ORAL | Status: AC
  Administered 2021-08-02: 10:00:00 25 mg via ORAL
  Filled 2021-08-02: qty 1

## 2021-08-02 MED ORDER — SODIUM CHLORIDE 0.9 % IV SOLN
420.0000 mg | Freq: Once | INTRAVENOUS | Status: AC
  Administered 2021-08-02: 11:00:00 420 mg via INTRAVENOUS
  Filled 2021-08-02: qty 14

## 2021-08-02 MED ORDER — SODIUM CHLORIDE 0.9 % IV SOLN: Freq: Once | INTRAVENOUS | Status: AC

## 2021-08-02 MED ORDER — ACETAMINOPHEN 325 MG PO TABS
650.0000 mg | ORAL_TABLET | Freq: Once | ORAL | Status: AC
  Administered 2021-08-02: 10:00:00 650 mg via ORAL
  Filled 2021-08-02: qty 2

## 2021-08-02 MED ORDER — SODIUM CHLORIDE 0.9% FLUSH
10.0000 mL | INTRAVENOUS | Status: DC | PRN
  Administered 2021-08-02: 11:00:00 10 mL

## 2021-08-04 ENCOUNTER — Encounter: Payer: Self-pay | Admitting: Radiology

## 2021-08-05 ENCOUNTER — Ambulatory Visit: Payer: Self-pay | Admitting: Radiation Oncology

## 2021-08-11 NOTE — Progress Notes (Incomplete)
Radiation Oncology         [phone] ________________________________  Patient Name: Kristina Harvey MRN: [id number] DOB: 08/03/1966 Referring Physician: Allison Quarry (Profile Not Attached) Date of Service: 07/01/2021 [facility] [place], [place]  End Of Treatment Note  Diagnoses: C50.512-Malignant neoplasm of lower-outer quadrant of left female breast Z17.1-Estrogen receptor negative status [ER-]  Cancer Staging: Stage II-A (cT1c, cN1)  Left Breast LOQ, Invasive Ductal Carcinoma, ER- / PR- / Her2+, Grade 3   Intent: Curative  Radiation Treatment Dates: 05/18/2021 through 07/01/2021 Site Technique Total Dose (Gy) Dose per Fx (Gy) Completed Fx Beam Energies  Breast, Left: Breast_Lt 3D 50.4/50.4 1.8 28/28 10X  Breast, Left: Breast_Lt_SCLV 3D 50.4/50.4 1.8 28/28 6X, 10X  Breast, Left: Breast_Lt_Bst 3D 10/10 2 5/5 6X, 10X   Narrative: The patient tolerated radiation therapy relatively well. She denies pain, reports having a good energy level. Continues to have swelling and tenderness to left breast. Full range of motion to left arm. Skin noted to be hyperpigmented. Patient states skin is itching.   On physical exam, there is less erythema in the supraclavicular region and much of the breast.  No moist desquamation noted.  Some dry desquamation appreciated. The patient is using gel pads which is quite soothing for her.   Plan: The patient will follow-up with radiation oncology in one month .  ________________________________________________ -----------------------------------  Blair Promise, PhD, MD  This document serves as a record of services personally performed by Gery Pray, MD. It was created on his behalf by Roney Mans, a trained medical scribe. The creation of this record is based on the scribe's personal observations and the provider's statements to them. This document has been checked and approved by the attending provider.

## 2021-08-11 NOTE — Progress Notes (Signed)
Radiation Oncology         [phone] ________________________________  Name: Kristina Harvey MRN: [id number]  Date: 08/12/2021  DOB: [birth year]  Follow-Up Visit Note  CC: Alroy Dust, L.Marlou Sa, MD  Alroy Dust, L.Marlou Sa, MD    ICD-10-CM   1. Malignant neoplasm of lower-outer quadrant of left breast of female, estrogen receptor negative ([place])  C50.512    Z17.1       Diagnosis: Stage II-A (cT1c, cN1)  Left Breast LOQ, Invasive Ductal Carcinoma, ER- / PR- / Her2+, Grade 3   Interval Since Last Radiation: 1 month and 12 days   Intent: Curative  Radiation Treatment Dates: 05/18/2021 through 07/01/2021 Site Technique Total Dose (Gy) Dose per Fx (Gy) Completed Fx Beam Energies  Breast, Left: Breast_Lt 3D 50.4/50.4 1.8 28/28 10X  Breast, Left: Breast_Lt_SCLV 3D 50.4/50.4 1.8 28/28 6X, 10X  Breast, Left: Breast_Lt_Bst 3D 10/10 2 5/5 6X, 10X    Narrative:  The patient returns today for routine follow-up. She tolerated radiation therapy relatively well. She denied pain and reported having a good energy level. She did have continued swelling and tenderness to the left breast on the day of her final treatment, though was able to sustain good range of motion to her left arm. On physical exam performed on the day of her final treatment, less erythema was seen in the supraclavicular region and much of the breast.  No moist desquamation was appreciated, though some dry desquamation and hyperpigmentation was present. The patient used gel pads which worked quite nicely for her.   While receiving RT, the patient continued on chemotherapy maintenance consisting of Herceptin Perjeta under the care of Dr. Lindi Adie. During follow up with Dr. Lindi Adie on 05/24/21, the patient reported Herceptin Perjeta toxicities including diarrhea and constipation.   The patient most recently followed up with Dr. Lindi Adie on 07/12/21. During which time, the patient was noted to report significant improvement of her chemo associated  symptoms.   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  She is most bothered now from a burning sensation in the left shoulder area describes it as itching/burning from within her shoulder but not actually on the skin.  Her skin in this area has healed well.  She denies any significant itching or burning of her skin at this time.  She has some mild discomfort in the breast but no actual pain.  She denies any nipple discharge or bleeding.  Allergies:  is allergic to caffeine.  Meds: Current Outpatient Medications  Medication Sig Dispense Refill   acetaminophen (TYLENOL) 500 MG tablet Take 1,000 mg by mouth every 6 (six) hours as needed for moderate pain.     ALPRAZolam (XANAX) 0.25 MG tablet Take 0.25 mg by mouth at bedtime as needed for sleep.     colestipol (COLESTID) 1 g tablet Take 2 g by mouth at bedtime.     gabapentin (NEURONTIN) 100 MG capsule Take 1 capsule (100 mg total) by mouth daily. 30 capsule 0   gabapentin (NEURONTIN) 300 MG capsule Take 1 capsule (300 mg total) by mouth at bedtime. 30 capsule 0   levothyroxine (SYNTHROID) 75 MCG tablet Take 75 mcg by mouth daily before breakfast.     metoprolol succinate (TOPROL-XL) 25 MG 24 hr tablet Take 1 tablet (25 mg total) by mouth daily. 90 tablet 3   metoprolol tartrate (LOPRESSOR) 25 MG tablet Take 1 tablet (25 mg total) by mouth 2 (two) times daily as needed (palpitations). 60 tablet 3  Multiple Vitamin (MULTIVITAMIN WITH MINERALS) TABS tablet Take 1 tablet by mouth daily.     Polyethyl Glycol-Propyl Glycol (SYSTANE OP) Place 1 drop into both eyes 2 (two) times daily.     Probiotic Product (PROBIOTIC PO) Take 1 capsule by mouth daily.     Psyllium (METAMUCIL PO) Take 1 Scoop by mouth daily.     fluorouracil (EFUDEX) 5 % cream Apply 1 application topically daily. (Patient not taking: Reported on 07/20/2021)     No current facility-administered medications for this encounter.     Physical Findings: The patient is in no acute distress. Patient is alert and oriented.  height is 5' 7"  (1.702 m) and weight is 160 lb 12.8 oz (72.9 kg). Her temperature is 97.7 F (36.5 C). Her blood pressure is 131/77 and her pulse is 89. Her respiration is 18 and oxygen saturation is 99%. .  No significant changes. Lungs are clear to auscultation bilaterally. Heart has regular rate and rhythm. No palpable cervical, supraclavicular, or axillary adenopathy. Abdomen soft, non-tender, normal bowel sounds.   Left Breast: no palpable mass, nipple discharge or bleeding.  Patient's skin is healed well now.  Minimal hyperpigmentation changes noted.  Minimal edema noted in the breast.      Lab Findings: Lab Results  Component Value Date   WBC 5.4 07/12/2021   HGB 13.2 07/12/2021   HCT 38.0 07/12/2021   MCV 98.7 07/12/2021   PLT 191 07/12/2021    Radiographic Findings: No results found.  Impression:  Stage II-A (cT1c, cN1)  Left Breast LOQ, Invasive Ductal Carcinoma, ER- / PR- / Her2+, Grade 3   The patient is recovering from the effects of radiation.  Patient is quite bothered by this burning sensation in the left upper chest region.  Her skin is healed well in this area.  This symptom is keeping her awake at Night and is also bothersome during the day in addition.  We discussed potential prescription for Neurontin which she wishes to try given her significant symptoms.  Patient will be given 300 mg at bedtime and 100 mg during the day if symptoms become bothersome interfering with her work.  Plan: Patient will return in 1 month for follow-up and to evaluate her response to Neurontin as above.    ____________________________________  Blair Promise, PhD, MD   This document serves as a record of services personally performed by Gery Pray, MD. It was created on his behalf by Roney Mans, a trained medical scribe. The creation of this record is based on the scribe's personal  observations and the provider's statements to them. This document has been checked and approved by the attending provider.

## 2021-08-12 ENCOUNTER — Ambulatory Visit
Admission: RE | Admit: 2021-08-12 | Discharge: 2021-08-12 | Disposition: A | Discharge status: Home / Self Care | Payer: Managed Care, Other (non HMO) | Source: Ambulatory Visit | Attending: Radiation Oncology | Admitting: Radiation Oncology

## 2021-08-12 ENCOUNTER — Encounter: Payer: Self-pay | Admitting: Radiation Oncology

## 2021-08-12 ENCOUNTER — Other Ambulatory Visit: Payer: Self-pay

## 2021-08-12 VITALS — BP 131/77 | HR 89 | Temp 97.7°F | Resp 18 | Ht 67.0 in | Wt 160.8 lb

## 2021-08-12 DIAGNOSIS — G629 Polyneuropathy, unspecified: Secondary | ICD-10-CM | POA: Diagnosis not present

## 2021-08-12 DIAGNOSIS — C50512 Malignant neoplasm of lower-outer quadrant of left female breast: Secondary | ICD-10-CM | POA: Insufficient documentation

## 2021-08-12 DIAGNOSIS — Z79899 Other long term (current) drug therapy: Secondary | ICD-10-CM | POA: Diagnosis not present

## 2021-08-12 DIAGNOSIS — Z923 Personal history of irradiation: Secondary | ICD-10-CM | POA: Insufficient documentation

## 2021-08-12 DIAGNOSIS — Z171 Estrogen receptor negative status [ER-]: Secondary | ICD-10-CM | POA: Diagnosis not present

## 2021-08-12 MED ORDER — GABAPENTIN 300 MG PO CAPS: 300.0000 mg | ORAL_CAPSULE | Freq: Every day | ORAL | 0 refills | Status: DC

## 2021-08-12 MED ORDER — GABAPENTIN 100 MG PO CAPS: 100.0000 mg | ORAL_CAPSULE | Freq: Every day | ORAL | 0 refills | Status: DC

## 2021-08-12 NOTE — Progress Notes (Signed)
Kristina Harvey is here today for follow up post radiation to the breast.   Breast Side:left   They completed their radiation on: 07/01/2021   Does the patient complain of any of the following: Post radiation skin issues: itching, feels hot to touch  Breast Tenderness: sore since surgery Breast Swelling: mild Lymphadema: no Range of Motion limitations: demonstrates full range of motion Fatigue post radiation: mild but more than during radiation treatment Appetite good/fair/poor: good  Additional comments if applicable: eczema  Vitals:   08/12/21 0850  BP: 131/77  Pulse: 89  Resp: 18  Temp: 97.7 F (36.5 C)  SpO2: 99%  Weight: 160 lb 12.8 oz (72.9 kg)  Height: 5\' 7"  (1.702 m)

## 2021-08-20 ENCOUNTER — Other Ambulatory Visit: Payer: Self-pay | Admitting: *Deleted

## 2021-08-20 DIAGNOSIS — C50512 Malignant neoplasm of lower-outer quadrant of left female breast: Secondary | ICD-10-CM

## 2021-08-20 DIAGNOSIS — Z171 Estrogen receptor negative status [ER-]: Secondary | ICD-10-CM

## 2021-08-22 NOTE — Assessment & Plan Note (Signed)
11/04/2020:Screening mammogram showed a possible left breast mass and enlarged axillary lymph nodes. Diagnostic mammogram and US showed a 1.4cm mass at the 3:30 position in the left breast and 2 abnormal lymph nodes in the left axilla. 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  Treatment Plan: 1.Neoadjuvant chemotherapy with [place] Perjeta 6 cyclescompleted 7/29/2022followed by Herceptin Perjeta maintenance versus Kadcyla maintenance (based on response to neoadjuvant chemo) for 1 year 2. Left lumpectomy 04/16/2021: Pathologic complete response, 0/3 lymph nodes negative 3. Followed by adjuvant radiation therapy10/12/2020-07/01/2021 4.Consideration for neratinib ------------------------------------------------------------------------------------------------------------------------------- Current Treatment:Completed 6 cycles of(11/27/20-03/12/2021), currently on Herceptin Perjeta maintenance Herceptin Perjeta toxicities:She has significant improvement in the symptoms.  Diarrhea is down to 3 times a day mostly soft stools.  Return to clinicforHerceptin Perjeta maintenance We discussed the role of neratinib briefly.  She is concerned about risk of recurrence.  If she decides to do it we will consider adding this after Herceptin Perjeta maintenance is completed.  Breast Cancer Surveillance: I recommended that we get a mammogram in March 2023.  Every 3 weeks for Herceptin Perjeta every 6 weeks to follow-up with me.

## 2021-08-23 ENCOUNTER — Inpatient Hospital Stay: Payer: Managed Care, Other (non HMO) | Attending: Hematology and Oncology

## 2021-08-23 ENCOUNTER — Other Ambulatory Visit: Payer: Self-pay

## 2021-08-23 ENCOUNTER — Inpatient Hospital Stay: Payer: Managed Care, Other (non HMO)

## 2021-08-23 ENCOUNTER — Inpatient Hospital Stay (HOSPITAL_BASED_OUTPATIENT_CLINIC_OR_DEPARTMENT_OTHER): Payer: Managed Care, Other (non HMO) | Admitting: Hematology and Oncology

## 2021-08-23 VITALS — BP 128/83 | HR 73 | Temp 98.1°F | Resp 18

## 2021-08-23 DIAGNOSIS — C50512 Malignant neoplasm of lower-outer quadrant of left female breast: Secondary | ICD-10-CM | POA: Insufficient documentation

## 2021-08-23 DIAGNOSIS — Z171 Estrogen receptor negative status [ER-]: Secondary | ICD-10-CM | POA: Insufficient documentation

## 2021-08-23 DIAGNOSIS — Z5112 Encounter for antineoplastic immunotherapy: Secondary | ICD-10-CM | POA: Insufficient documentation

## 2021-08-23 DIAGNOSIS — Z95828 Presence of other vascular implants and grafts: Secondary | ICD-10-CM

## 2021-08-23 LAB — CBC WITH DIFFERENTIAL (CANCER CENTER ONLY)
Abs Immature Granulocytes: 0.02 10*3/uL (ref 0.00–0.07)
Basophils Absolute: 0 10*3/uL (ref 0.0–0.1)
Basophils Relative: 0 %
Eosinophils Absolute: 0.1 10*3/uL (ref 0.0–0.5)
Eosinophils Relative: 2 %
HCT: 35.1 % — ABNORMAL LOW (ref 36.0–46.0)
Hemoglobin: 12.1 g/dL (ref 12.0–15.0)
Immature Granulocytes: 0 %
Lymphocytes Relative: 13 %
Lymphs Abs: 0.7 10*3/uL (ref 0.7–4.0)
MCH: 34.9 pg — ABNORMAL HIGH (ref 26.0–34.0)
MCHC: 34.5 g/dL (ref 30.0–36.0)
MCV: 101.2 fL — ABNORMAL HIGH (ref 80.0–100.0)
Monocytes Absolute: 0.6 10*3/uL (ref 0.1–1.0)
Monocytes Relative: 12 %
Neutro Abs: 3.8 10*3/uL (ref 1.7–7.7)
Neutrophils Relative %: 73 %
Platelet Count: 240 10*3/uL (ref 150–400)
RBC: 3.47 MIL/uL — ABNORMAL LOW (ref 3.87–5.11)
RDW: 12.4 % (ref 11.5–15.5)
WBC Count: 5.3 10*3/uL (ref 4.0–10.5)
nRBC: 0 % (ref 0.0–0.2)

## 2021-08-23 LAB — CMP (CANCER CENTER ONLY)
ALT: 14 U/L (ref 0–44)
AST: 17 U/L (ref 15–41)
Albumin: 3.7 g/dL (ref 3.5–5.0)
Alkaline Phosphatase: 57 U/L (ref 38–126)
Anion gap: 6 (ref 5–15)
BUN: 12 mg/dL (ref 6–20)
CO2: 26 mmol/L (ref 22–32)
Calcium: 8.7 mg/dL — ABNORMAL LOW (ref 8.9–10.3)
Chloride: 105 mmol/L (ref 98–111)
Creatinine: 0.65 mg/dL (ref 0.44–1.00)
GFR, Estimated: >60 mL/min (ref >60)
Glucose, Bld: 107 mg/dL — ABNORMAL HIGH (ref 70–99)
Potassium: 4 mmol/L (ref 3.5–5.1)
Sodium: 137 mmol/L (ref 135–145)
Total Bilirubin: 0.5 mg/dL (ref 0.3–1.2)
Total Protein: 6.9 g/dL (ref 6.5–8.1)

## 2021-08-23 MED ORDER — DIPHENHYDRAMINE HCL 25 MG PO CAPS
25.0000 mg | ORAL_CAPSULE | Freq: Once | ORAL | Status: AC
  Administered 2021-08-23: 25 mg via ORAL
  Filled 2021-08-23: qty 1

## 2021-08-23 MED ORDER — SODIUM CHLORIDE 0.9 % IV SOLN
420.0000 mg | Freq: Once | INTRAVENOUS | Status: AC
  Administered 2021-08-23: 420 mg via INTRAVENOUS
  Filled 2021-08-23: qty 14

## 2021-08-23 MED ORDER — HEPARIN SOD (PORK) LOCK FLUSH 100 UNIT/ML IV SOLN
500.0000 [IU] | Freq: Once | INTRAVENOUS | Status: AC | PRN
  Administered 2021-08-23: 500 [IU]

## 2021-08-23 MED ORDER — SODIUM CHLORIDE 0.9% FLUSH
10.0000 mL | INTRAVENOUS | Status: DC | PRN
  Administered 2021-08-23: 10 mL

## 2021-08-23 MED ORDER — SODIUM CHLORIDE 0.9% FLUSH
10.0000 mL | Freq: Once | INTRAVENOUS | Status: AC
  Administered 2021-08-23: 10 mL

## 2021-08-23 MED ORDER — SODIUM CHLORIDE 0.9 % IV SOLN: Freq: Once | INTRAVENOUS | Status: AC

## 2021-08-23 MED ORDER — TRASTUZUMAB-ANNS CHEMO 150 MG IV SOLR
6.0000 mg/kg | Freq: Once | INTRAVENOUS | Status: AC
  Administered 2021-08-23: 441 mg via INTRAVENOUS
  Filled 2021-08-23: qty 21

## 2021-08-23 MED ORDER — ACETAMINOPHEN 325 MG PO TABS
650.0000 mg | ORAL_TABLET | Freq: Once | ORAL | Status: AC
  Administered 2021-08-23: 650 mg via ORAL
  Filled 2021-08-23: qty 2

## 2021-08-23 NOTE — Progress Notes (Signed)
Pt declined 30 min post observation. VSS

## 2021-08-23 NOTE — Progress Notes (Signed)
Patient Care Team: Alroy Dust, L.Marlou Sa, MD as PCP - General (Family Medicine) Jovita Kussmaul, MD as Consulting Physician (General Surgery) Nicholas Lose, MD as Consulting Physician (Hematology and Oncology) Gery Pray, MD as Consulting Physician (Radiation Oncology) Mauro Kaufmann, RN as Oncology Nurse Navigator Rockwell Germany, RN as Oncology Nurse Navigator  DIAGNOSIS:  Encounter Diagnosis  Name Primary?   Malignant neoplasm of lower-outer quadrant of left breast of female, estrogen receptor negative ([place])     SUMMARY OF ONCOLOGIC HISTORY: Oncology History  Malignant neoplasm of lower-outer quadrant of left breast of female, estrogen receptor negative ([place])  11/04/2020 Initial Diagnosis   Screening mammogram showed a possible left breast mass and enlarged axillary lymph nodes. Diagnostic mammogram and US showed a 1.4cm mass at the 3:30 position in the left breast and 2 abnormal lymph nodes in the left axilla. 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   11/11/2020 Cancer Staging   Staging form: Breast, AJCC 8th Edition - Clinical stage from 11/11/2020: Stage IIA (cT1c, cN1, cM0, G3, ER-, PR-, HER2+) - Signed by Nicholas Lose, MD on 11/11/2020 Stage prefix: Initial diagnosis Histologic grading system: 3 grade system Laterality: Left Staged by: Pathologist and managing physician Stage used in treatment planning: Yes National guidelines used in treatment planning: Yes Type of national guideline used in treatment planning: NCCN    11/27/2020 - 04/02/2021 Chemotherapy   TCHP x6 cycles       04/16/2021 Surgery   Left lumpectomy 04/16/2021: Pathologic complete response, 0/3 lymph nodes negative   05/03/2021 -  Chemotherapy   Patient is on Treatment Plan : BREAST Trastuzumab + Pertuzumab q21d     05/20/2021 - 07/01/2021 Radiation Therapy   Adjuvant XRT     CHIEF COMPLIANT:   INTERVAL HISTORY:  Kristina Harvey is a   ALLERGIES:  is allergic to caffeine.  MEDICATIONS:  Current Outpatient Medications  Medication Sig Dispense Refill   acetaminophen (TYLENOL) 500 MG tablet Take 1,000 mg by mouth every 6 (six) hours as needed for moderate pain.     ALPRAZolam (XANAX) 0.25 MG tablet Take 0.25 mg by mouth at bedtime as needed for sleep.     colestipol (COLESTID) 1 g tablet Take 2 g by mouth at bedtime.     fluorouracil (EFUDEX) 5 % cream Apply 1 application topically daily. (Patient not taking: Reported on 07/20/2021)     gabapentin (NEURONTIN) 100 MG capsule Take 1 capsule (100 mg total) by mouth daily. 30 capsule 0   gabapentin (NEURONTIN) 300 MG capsule Take 1 capsule (300 mg total) by mouth at bedtime. 30 capsule 0   levothyroxine (SYNTHROID) 75 MCG tablet Take 75 mcg by mouth daily before breakfast.     metoprolol succinate (TOPROL-XL) 25 MG 24 hr tablet Take 1 tablet (25 mg total) by mouth daily. 90 tablet 3   metoprolol tartrate (LOPRESSOR) 25 MG tablet Take 1 tablet (25 mg total) by mouth 2 (two) times daily as needed (palpitations). 60 tablet 3   Multiple Vitamin (MULTIVITAMIN WITH MINERALS) TABS tablet Take 1 tablet by mouth daily.     Polyethyl Glycol-Propyl Glycol (SYSTANE OP) Place 1 drop into both eyes 2 (two) times daily.     Probiotic Product (PROBIOTIC PO) Take 1 capsule by mouth daily.     Psyllium (METAMUCIL PO) Take 1 Scoop by mouth daily.     No current facility-administered medications for this visit.  PHYSICAL EXAMINATION: ECOG PERFORMANCE STATUS: 1 - Symptomatic but completely ambulatory  Vitals:   08/23/21 0902  BP: 133/65  Pulse: 88  Resp: 18  Temp: (!) 97.3 F (36.3 C)  SpO2: 99%   Filed Weights   08/23/21 0902  Weight: 161 lb 12.8 oz (73.4 kg)    BREAST: No palpable masses or nodules in either right or left breasts. No palpable axillary supraclavicular or infraclavicular adenopathy no breast tenderness or nipple discharge. (exam performed in the  presence of a chaperone)  LABORATORY DATA:  I have reviewed the data as listed CMP Latest Ref Rng & Units 07/12/2021 06/14/2021 05/24/2021  Glucose 70 - 99 mg/dL 116(H) 86 71  BUN 6 - 20 mg/dL _0 Creatinine 0.44 - 1.00 mg/dL 0.72 0.67 0.66  Sodium 135 - 145 mmol/L 141 140 141  Potassium 3.5 - 5.1 mmol/L 4.4 3.6 3.9  Chloride 98 - 111 mmol/L 105 107 107  CO2 22 - 32 mmol/L _1 Calcium 8.9 - 10.3 mg/dL 9.2 8.6(L) 9.2  Total Protein 6.5 - 8.1 g/dL 7.5 6.9 6.9  Total Bilirubin 0.3 - 1.2 mg/dL 0.7 0.3 0.3  Alkaline Phos 38 - 126 U/L 72 61 60  AST 15 - 41 U/L _2 ALT 0 - 44 U/L _3 Lab Results  Component Value Date   WBC 5.3 08/23/2021   HGB 12.1 08/23/2021   HCT 35.1 (L) 08/23/2021   MCV 101.2 (H) 08/23/2021   PLT 240 08/23/2021   NEUTROABS 3.8 08/23/2021    ASSESSMENT & PLAN:  Malignant neoplasm of lower-outer quadrant of left breast of female, estrogen receptor negative ([place]) 11/04/2020:Screening mammogram showed a possible left breast mass and enlarged axillary lymph nodes. Diagnostic mammogram and US showed a 1.4cm mass at the 3:30 position in the left breast and 2 abnormal lymph nodes in the left axilla. 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   Treatment Plan: 1. Neoadjuvant chemotherapy with [place] Perjeta 6 cycles completed 03/12/2021 followed by Herceptin Perjeta maintenance versus Kadcyla maintenance (based on response to neoadjuvant chemo) for 1 year 2. Left lumpectomy 04/16/2021: Pathologic complete response, 0/3 lymph nodes negative 3. Followed by adjuvant radiation therapy 05/19/2021-07/01/2021 4.  Consideration for neratinib ------------------------------------------------------------------------------------------------------------------------------- Current Treatment: Completed 6 cycles of (11/27/20-03/12/2021), currently on Herceptin Perjeta  maintenance Herceptin Perjeta toxicities: Diarrhea has resolved   Return to clinic for Herceptin Perjeta maintenance We discussed the role of neratinib briefly.   After much discussion she decided that she does not want to take it.  Breast Cancer Surveillance: I recommended that we get a mammogram in March 2023. Echocardiogram will be obtained: Patient is experiencing tachycardia of unknown reason   Every 3 weeks for Herceptin Perjeta every 6 weeks to follow-up with me.     No orders of the defined types were placed in this encounter.  The patient has a good understanding of the overall plan. she agrees with it. she will call with any problems that may develop before the next visit here. Total time spent: 30 mins including face to face time and time spent for planning, charting and co-ordination of care   Kristina Ohara, MD 08/23/21

## 2021-09-08 NOTE — Progress Notes (Signed)
Radiation Oncology         [phone] ________________________________  Name: Kristina Harvey MRN: [id number]  Date: 09/09/2021  DOB: Feb 20, 1966  Follow-Up Visit Note  CC: Alroy Dust, L.Marlou Sa, MD  Alroy Dust, L.Marlou Sa, MD    ICD-10-CM   1. Malignant neoplasm of lower-outer quadrant of left breast of female, estrogen receptor negative ([place])  C50.512    Z17.1       Diagnosis: Stage II-A (cT1c, cN1)  Left Breast LOQ, Invasive Ductal Carcinoma, ER- / PR- / Her2+, Grade 3   Interval Since Last Radiation: 2 months and 9 days   Intent: Curative  Radiation Treatment Dates: 05/18/2021 through 07/01/2021 Site Technique Total Dose (Gy) Dose per Fx (Gy) Completed Fx Beam Energies  Breast, Left: Breast_Lt 3D 50.4/50.4 1.8 28/28 10X  Breast, Left: Breast_Lt_SCLV 3D 50.4/50.4 1.8 28/28 6X, 10X  Breast, Left: Breast_Lt_Bst 3D 10/10 2 5/5 6X, 10X    Narrative:  The patient returns today for follow up. To review from our follow up visit on 08/12/21, the patient reported a burning sensation in the left upper chest region. Her skin appeared to be healing well in this area, but the burning sensation caused her a significant amount of pain which kept her up at night. Accordingly, I prescribed her Neurontin (300 mg at bedtime and 100 mg during the day). She returns today for evaluation of her response.       In the interval since she was last seen, the patient followed up with Dr. Lindi Adie on 08/23/21. During which time, the patient was noted to have significant improvement / near resolution of her diarrhea from herceptin perjeta. Otherwise, the patient was noted to be doing well and will continue on herceptin perjeta maintenance. Dr. Lindi Adie also discussed the role of neratinib with the patient, though after discussion, the patient decided that she does not want to pursue this.  She reports continued occasional burning tingling sensation in the left supraclavicular and left upper back region.  The Neurontin that  she was given was helpful for this issue.  She did notice weight gain and actually only used the 100 mg tablet.  She has since found that oral Benadryl at night is most helpful for her however this does make her drowsy in the morning.  She has some mild discomfort in the left breast from associated swelling.  She denies any nipple discharge or bleeding.                      Allergies:  is allergic to caffeine.  Meds: Current Outpatient Medications  Medication Sig Dispense Refill   acetaminophen (TYLENOL) 500 MG tablet Take 1,000 mg by mouth every 6 (six) hours as needed for moderate pain.     ALPRAZolam (XANAX) 0.25 MG tablet Take 0.25 mg by mouth at bedtime as needed for sleep.     colestipol (COLESTID) 1 g tablet Take 2 g by mouth at bedtime.     gabapentin (NEURONTIN) 100 MG capsule Take 1 capsule (100 mg total) by mouth daily. 30 capsule 0   levothyroxine (SYNTHROID) 75 MCG tablet Take 75 mcg by mouth daily before breakfast.     metoprolol succinate (TOPROL-XL) 25 MG 24 hr tablet Take 1 tablet (25 mg total) by mouth daily. 90 tablet 3   metoprolol tartrate (LOPRESSOR) 25 MG tablet Take 1 tablet (25 mg total) by mouth 2 (two) times daily as needed (palpitations). 60 tablet 3   Multiple Vitamin (MULTIVITAMIN WITH MINERALS) TABS tablet  Take 1 tablet by mouth daily.     Polyethyl Glycol-Propyl Glycol (SYSTANE OP) Place 1 drop into both eyes 2 (two) times daily.     Probiotic Product (PROBIOTIC PO) Take 1 capsule by mouth daily.     fluorouracil (EFUDEX) 5 % cream Apply 1 application topically daily. (Patient not taking: Reported on 07/20/2021)     gabapentin (NEURONTIN) 300 MG capsule Take 1 capsule (300 mg total) by mouth at bedtime. (Patient not taking: Reported on 09/09/2021) 30 capsule 0   Psyllium (METAMUCIL PO) Take 1 Scoop by mouth daily. (Patient not taking: Reported on 09/09/2021)     No current facility-administered medications for this encounter.    Physical Findings: The patient is  in no acute distress. Patient is alert and oriented.  height is _0  (1.702 m) and weight is 163 lb 6 oz (74.1 kg). Her temporal temperature is 96.3 F (35.7 C) (abnormal). Her blood pressure is 131/80 and her pulse is 85. Her respiration is 18 and oxygen saturation is 100%. .  No significant changes. Lungs are clear to auscultation bilaterally. Heart has regular rate and rhythm. No palpable cervical, supraclavicular, or axillary adenopathy. Abdomen soft, non-tender, normal bowel sounds.     Left Breast: no palpable mass, nipple discharge or bleeding.  She continues to have some edema but her overall edema has improved since last exam.  Edema is most noted in the nipple areolar complex area.  Minimal erythema and hyperpigmentation changes noted.    Lab Findings: Lab Results  Component Value Date   WBC 5.3 08/23/2021   HGB 12.1 08/23/2021   HCT 35.1 (L) 08/23/2021   MCV 101.2 (H) 08/23/2021   PLT 240 08/23/2021    Radiographic Findings: No results found.  Impression:  Stage II-A (cT1c, cN1)  Left Breast LOQ, Invasive Ductal Carcinoma, ER- / PR- / Her2+, Grade 3   Her symptoms have improved over the past month.  She does not wish to continue on the Neurontin as she noticed weight gain issues with this medication.  As above she feels oral Benadryl at night is most helpful for her.  Discussed potential referral to physical therapy for breast lymphedema issues if that becomes an issue in the future.  Plan: As needed follow-up in radiation oncology.  Patient will continue close follow-up in medical oncology and continue on therapy as above.    ____________________________________  Blair Promise, PhD, MD   This document serves as a record of services personally performed by Gery Pray, MD. It was created on his behalf by Roney Mans, a trained medical scribe. The creation of this record is based on the scribe's personal observations and the provider's statements to them. This  document has been checked and approved by the attending provider.

## 2021-09-09 ENCOUNTER — Encounter: Payer: Self-pay | Admitting: Radiation Oncology

## 2021-09-09 ENCOUNTER — Ambulatory Visit
Admission: RE | Admit: 2021-09-09 | Discharge: 2021-09-09 | Disposition: A | Discharge status: Home / Self Care | Payer: Managed Care, Other (non HMO) | Source: Ambulatory Visit | Attending: Radiation Oncology | Admitting: Radiation Oncology

## 2021-09-09 ENCOUNTER — Other Ambulatory Visit: Payer: Self-pay

## 2021-09-09 DIAGNOSIS — Z923 Personal history of irradiation: Secondary | ICD-10-CM | POA: Insufficient documentation

## 2021-09-09 DIAGNOSIS — C50512 Malignant neoplasm of lower-outer quadrant of left female breast: Secondary | ICD-10-CM | POA: Diagnosis present

## 2021-09-09 DIAGNOSIS — Z171 Estrogen receptor negative status [ER-]: Secondary | ICD-10-CM | POA: Diagnosis not present

## 2021-09-09 DIAGNOSIS — Z79899 Other long term (current) drug therapy: Secondary | ICD-10-CM | POA: Insufficient documentation

## 2021-09-09 NOTE — Progress Notes (Signed)
Kristina Harvey is here today for follow up post radiation to the breast.   Breast Side:left   They completed their radiation on: 07/01/21  Does the patient complain of any of the following: Post radiation skin issues: Patient reports having a burning sensation to left shoulder.  Breast Tenderness: yes Breast Swelling: mild  Lymphadema: no Range of Motion limitations:No  Fatigue post radiation: continues to have fatigue.  Appetite good/fair/poor: good  Additional comments if applicable:  Vitals:   65/78/46 0813  BP: 131/80  Pulse: 85  Resp: 18  Temp: (!) 96.3 F (35.7 C)  TempSrc: Temporal  SpO2: 100%  Weight: 163 lb 6 oz (74.1 kg)  Height: 5\' 7"  (1.702 m)

## 2021-09-10 ENCOUNTER — Ambulatory Visit (HOSPITAL_COMMUNITY)
Admission: RE | Admit: 2021-09-10 | Discharge: 2021-09-10 | Disposition: A | Discharge status: Home / Self Care | Payer: Managed Care, Other (non HMO) | Source: Ambulatory Visit | Attending: Hematology and Oncology | Admitting: Hematology and Oncology

## 2021-09-10 ENCOUNTER — Other Ambulatory Visit: Payer: Self-pay | Admitting: *Deleted

## 2021-09-10 DIAGNOSIS — Z171 Estrogen receptor negative status [ER-]: Secondary | ICD-10-CM

## 2021-09-10 DIAGNOSIS — C50512 Malignant neoplasm of lower-outer quadrant of left female breast: Secondary | ICD-10-CM

## 2021-09-10 DIAGNOSIS — E785 Hyperlipidemia, unspecified: Secondary | ICD-10-CM | POA: Diagnosis not present

## 2021-09-10 DIAGNOSIS — Z0189 Encounter for other specified special examinations: Secondary | ICD-10-CM | POA: Diagnosis not present

## 2021-09-10 LAB — ECHOCARDIOGRAM COMPLETE
AR max vel: 2.17 cm2
AV Area VTI: 2.07 cm2
AV Area mean vel: 2.14 cm2
AV Mean grad: 4 mmHg
AV Peak grad: 6.8 mmHg
Ao pk vel: 1.3 m/s
Area-P 1/2: 5.13 cm2
S' Lateral: 2.8 cm

## 2021-09-10 NOTE — Progress Notes (Signed)
Echocardiogram 2D Echocardiogram has been performed.  Arlyss Gandy 09/10/2021, 11:07 AM

## 2021-09-13 ENCOUNTER — Other Ambulatory Visit: Payer: Self-pay

## 2021-09-13 ENCOUNTER — Encounter: Payer: Self-pay | Admitting: *Deleted

## 2021-09-13 ENCOUNTER — Inpatient Hospital Stay: Payer: Managed Care, Other (non HMO)

## 2021-09-13 VITALS — BP 136/74 | HR 86 | Temp 98.0°F | Resp 16 | Ht 67.5 in | Wt 164.5 lb

## 2021-09-13 DIAGNOSIS — Z171 Estrogen receptor negative status [ER-]: Secondary | ICD-10-CM

## 2021-09-13 DIAGNOSIS — C50512 Malignant neoplasm of lower-outer quadrant of left female breast: Secondary | ICD-10-CM

## 2021-09-13 DIAGNOSIS — Z5112 Encounter for antineoplastic immunotherapy: Secondary | ICD-10-CM | POA: Diagnosis not present

## 2021-09-13 DIAGNOSIS — Z95828 Presence of other vascular implants and grafts: Secondary | ICD-10-CM

## 2021-09-13 LAB — CMP (CANCER CENTER ONLY)
ALT: 16 U/L (ref 0–44)
AST: 23 U/L (ref 15–41)
Albumin: 4 g/dL (ref 3.5–5.0)
Alkaline Phosphatase: 58 U/L (ref 38–126)
Anion gap: 8 (ref 5–15)
BUN: 13 mg/dL (ref 6–20)
CO2: 26 mmol/L (ref 22–32)
Calcium: 9 mg/dL (ref 8.9–10.3)
Chloride: 104 mmol/L (ref 98–111)
Creatinine: 0.69 mg/dL (ref 0.44–1.00)
GFR, Estimated: >60 mL/min (ref >60)
Glucose, Bld: 163 mg/dL — ABNORMAL HIGH (ref 70–99)
Potassium: 4.2 mmol/L (ref 3.5–5.1)
Sodium: 138 mmol/L (ref 135–145)
Total Bilirubin: 0.5 mg/dL (ref 0.3–1.2)
Total Protein: 7.2 g/dL (ref 6.5–8.1)

## 2021-09-13 LAB — CBC WITH DIFFERENTIAL (CANCER CENTER ONLY)
Abs Immature Granulocytes: 0.01 10*3/uL (ref 0.00–0.07)
Basophils Absolute: 0 10*3/uL (ref 0.0–0.1)
Basophils Relative: 0 %
Eosinophils Absolute: 0.1 10*3/uL (ref 0.0–0.5)
Eosinophils Relative: 2 %
HCT: 38.1 % (ref 36.0–46.0)
Hemoglobin: 12.7 g/dL (ref 12.0–15.0)
Immature Granulocytes: 0 %
Lymphocytes Relative: 16 %
Lymphs Abs: 0.8 10*3/uL (ref 0.7–4.0)
MCH: 33.9 pg (ref 26.0–34.0)
MCHC: 33.3 g/dL (ref 30.0–36.0)
MCV: 101.6 fL — ABNORMAL HIGH (ref 80.0–100.0)
Monocytes Absolute: 0.6 10*3/uL (ref 0.1–1.0)
Monocytes Relative: 12 %
Neutro Abs: 3.3 10*3/uL (ref 1.7–7.7)
Neutrophils Relative %: 70 %
Platelet Count: 221 10*3/uL (ref 150–400)
RBC: 3.75 MIL/uL — ABNORMAL LOW (ref 3.87–5.11)
RDW: 11.7 % (ref 11.5–15.5)
WBC Count: 4.8 10*3/uL (ref 4.0–10.5)
nRBC: 0 % (ref 0.0–0.2)

## 2021-09-13 MED ORDER — HEPARIN SOD (PORK) LOCK FLUSH 100 UNIT/ML IV SOLN
500.0000 [IU] | Freq: Once | INTRAVENOUS | Status: AC | PRN
  Administered 2021-09-13: 500 [IU]

## 2021-09-13 MED ORDER — SODIUM CHLORIDE 0.9 % IV SOLN: Freq: Once | INTRAVENOUS | Status: AC

## 2021-09-13 MED ORDER — TRASTUZUMAB-ANNS CHEMO 150 MG IV SOLR
6.0000 mg/kg | Freq: Once | INTRAVENOUS | Status: AC
  Administered 2021-09-13: 441 mg via INTRAVENOUS
  Filled 2021-09-13: qty 21

## 2021-09-13 MED ORDER — SODIUM CHLORIDE 0.9 % IV SOLN
420.0000 mg | Freq: Once | INTRAVENOUS | Status: AC
  Administered 2021-09-13: 420 mg via INTRAVENOUS
  Filled 2021-09-13: qty 14

## 2021-09-13 MED ORDER — ACETAMINOPHEN 325 MG PO TABS
650.0000 mg | ORAL_TABLET | Freq: Once | ORAL | Status: AC
  Administered 2021-09-13: 650 mg via ORAL
  Filled 2021-09-13: qty 2

## 2021-09-13 MED ORDER — DIPHENHYDRAMINE HCL 25 MG PO CAPS
25.0000 mg | ORAL_CAPSULE | Freq: Once | ORAL | Status: AC
  Administered 2021-09-13: 25 mg via ORAL
  Filled 2021-09-13: qty 1

## 2021-09-13 MED ORDER — SODIUM CHLORIDE 0.9% FLUSH
10.0000 mL | Freq: Once | INTRAVENOUS | Status: AC
  Administered 2021-09-13: 10 mL

## 2021-09-13 MED ORDER — SODIUM CHLORIDE 0.9% FLUSH
10.0000 mL | INTRAVENOUS | Status: DC | PRN
  Administered 2021-09-13: 10 mL

## 2021-09-13 NOTE — Patient Instructions (Signed)
[facility]  Discharge Instructions: Thank you for choosing [facility] to provide your oncology and hematology care.   If you have a lab appointment with the [facility], please go directly to the [facility] and check in at the registration area.   Wear comfortable clothing and clothing appropriate for easy access to any Portacath or PICC line.   We strive to give you quality time with your provider. You may need to reschedule your appointment if you arrive late (15 or more minutes).  Arriving late affects you and other patients whose appointments are after yours.  Also, if you miss three or more appointments without notifying the office, you may be dismissed from the clinic at the providers discretion.      For prescription refill requests, have your pharmacy contact our office and allow 72 hours for refills to be completed.    Today you received the following chemotherapy and/or immunotherapy agents: Trastuzumab and Pertuzumab (Perjeta).   To help prevent nausea and vomiting after your treatment, we encourage you to take your nausea medication as directed.  BELOW ARE SYMPTOMS THAT SHOULD BE REPORTED IMMEDIATELY: *FEVER GREATER THAN 100.4 F (38 C) OR HIGHER *CHILLS OR SWEATING *NAUSEA AND VOMITING THAT IS NOT CONTROLLED WITH YOUR NAUSEA MEDICATION *UNUSUAL SHORTNESS OF BREATH *UNUSUAL BRUISING OR BLEEDING *URINARY PROBLEMS (pain or burning when urinating, or frequent urination) *BOWEL PROBLEMS (unusual diarrhea, constipation, pain near the anus) TENDERNESS IN MOUTH AND THROAT WITH OR WITHOUT PRESENCE OF ULCERS (sore throat, sores in mouth, or a toothache) UNUSUAL RASH, SWELLING OR PAIN  UNUSUAL VAGINAL DISCHARGE OR ITCHING   Items with * indicate a potential emergency and should be followed up as soon as possible or go to the Emergency Department if any problems should occur.  Please show the CHEMOTHERAPY ALERT CARD or IMMUNOTHERAPY  ALERT CARD at check-in to the Emergency Department and triage nurse.  Should you have questions after your visit or need to cancel or reschedule your appointment, please contact [facility]  Dept: [phone]  and follow the prompts.  Office hours are 8:00 a.m. to 4:30 p.m. Monday - Friday. Please note that voicemails left after 4:00 p.m. may not be returned until the following business day.  We are closed weekends and major holidays. You have access to a nurse at all times for urgent questions. Please call the main number to the clinic Dept: [phone] and follow the prompts.   For any non-urgent questions, you may also contact your provider using MyChart. We now offer e-Visits for anyone 28 and older to request care online for non-urgent symptoms. For details visit [email or web address].   Also download the MyChart app! Go to the app store, search "MyChart", open the app, select [facility], and log in with your MyChart username and password.  Due to Covid, a mask is required upon entering the hospital/clinic. If you do not have a mask, one will be given to you upon arrival. For doctor visits, patients may have 1 support person aged 96 or older with them. For treatment visits, patients cannot have anyone with them due to current Covid guidelines and our immunocompromised population.

## 2021-09-16 ENCOUNTER — Encounter: Payer: Managed Care, Other (non HMO) | Attending: Hematology and Oncology | Admitting: Physical Therapy

## 2021-09-16 ENCOUNTER — Other Ambulatory Visit: Payer: Self-pay

## 2021-09-16 ENCOUNTER — Encounter: Payer: Self-pay | Admitting: Physical Therapy

## 2021-09-16 DIAGNOSIS — C50512 Malignant neoplasm of lower-outer quadrant of left female breast: Secondary | ICD-10-CM | POA: Diagnosis not present

## 2021-09-16 DIAGNOSIS — Z171 Estrogen receptor negative status [ER-]: Secondary | ICD-10-CM | POA: Insufficient documentation

## 2021-09-16 DIAGNOSIS — R252 Cramp and spasm: Secondary | ICD-10-CM | POA: Diagnosis not present

## 2021-09-16 DIAGNOSIS — Z483 Aftercare following surgery for neoplasm: Secondary | ICD-10-CM | POA: Diagnosis not present

## 2021-09-16 NOTE — Patient Instructions (Signed)
Moisturizers They are used in the vagina to hydrate the mucous membrane that make up the vaginal canal. Designed to keep a more normal acid balance (ph) Once placed in the vagina, it will last between two to three days.  Use 2-3 times per week at bedtime  Ingredients to avoid is glycerin and fragrance, can increase chance of infection Should not be used just before sex due to causing irritation Most are gels administered either in a tampon-shaped applicator or as a vaginal suppository. They are non-hormonal.   Types of Moisturizers(internal use)  Vitamin E vaginal suppositories- Whole foods, Amazon Moist Again Coconut oil- can break down condoms Julva- (Do no use if on Tamoxifen) amazon Yes moisturizer- amazon NeuEve Silk , NeuEve Silver for menopausal or over 65 (if have severe vaginal atrophy or cancer treatments use NeuEve Silk for  1 month than move to [employer])- [employer], [place].com Olive and Bee intimate cream- www.oliveandbee.com.au Mae vaginal moisturizer- Amazon Aloe    Creams to use externally on the Vulva area [employer] (good for for cancer patients that had radiation to the area)- [place] or [employer].FlyingBasics.com.br V-magic cream - amazon Julva-amazon Vital "V [place] salve ( help moisturize and help with thinning vulvar area, does have Makena by Irwin Brakeman labial moisturizer (Amazon,  Coconut or olive oil aloe   Things to avoid in the vaginal area Do not use things to irritate the vulvar area No lotions just specialized creams for the vulva area- Neogyn, V-magic, No soaps; can use Aveeno or Calendula cleanser if needed. Must be gentle No deodorants No douches Good to sleep without underwear to let the vaginal area to air out No scrubbing: spread the lips to let warm water rinse over labias and pat dry  Earlie Counts, PT [facility] [address] [place], [place] [address] W: [phone] [email or web address]

## 2021-09-16 NOTE — Therapy (Signed)
[facility] at [facility] [address] [place], [place], [address] Phone: [phone]   Fax:  [phone]  Physical Therapy Evaluation  Patient Details  Name: ROYCE SCIARA MRN: [id number] Date of Birth: Jun 05, 1966 Referring Provider (PT): Dr. Ursula Alert   Encounter Date: 09/16/2021   PT End of Session - 09/16/21 1231     Visit Number 3   2 breast, 1 pelvic   Date for PT Re-Evaluation 12/09/21   05/14/21 for breast   Authorization Type [employer]    PT Start Time 1130    PT Stop Time 1215    PT Time Calculation (min) 45 min    Activity Tolerance Patient tolerated treatment well;Patient limited by pain    Behavior During Therapy [facility] for tasks assessed/performed             Past Medical History:  Diagnosis Date   Arrhythmia    paroxysmal atrial fibrillation   Cancer [facility])    Family history of breast cancer    Family history of prostate cancer    Family history of stomach cancer    History of radiation therapy    left breast and subclavian  05/18/2021-07/01/2021  Dr Gery Pray   Hyperlipidemia    Hypothyroidism    Thyroid disease     Past Surgical History:  Procedure Laterality Date   BREAST LUMPECTOMY WITH RADIOACTIVE SEED AND SENTINEL LYMPH NODE BIOPSY Left 04/16/2021   Procedure: LEFT BREAST LUMPECTOMY WITH RADIOACTIVE SEED AND SENTINEL LYMPH NODE BIOPSY;  Surgeon: Jovita Kussmaul, MD;  Location: [facility];  Service: General;  Laterality: Left;   chin implant  1996   IR [place]  11/24/2020   RADIOACTIVE SEED GUIDED AXILLARY SENTINEL LYMPH NODE Left 04/16/2021   Procedure: RADIOACTIVE SEED GUIDED AXILLARY SENTINEL LYMPH NODE DISSECTION;  Surgeon: Jovita Kussmaul, MD;  Location: [facility];  Service: General;  Laterality: Left;    There were no vitals filed for this visit.    Subjective Assessment - 09/16/21 1140     Subjective Patient had 3 rounds of the [place] lisa treatments prior to chemo. She now has  dryness again.    Pertinent History Patient was diagnosed on 10/14/2020 with left grade III invasive ductal carcinoma. She  underwent neoadjuvant chemotherapy from 11/27/220-03/12/2021 followed by a left lumpectomy and sentinel node biopsy (3 negative nodes) on 04/16/2021. It is ER/PR negative and HER2 positive with a Ki67 of 20%.    Patient Stated Goals vaginal dryness    Currently in Pain? Yes    Pain Score 10-Worst pain ever    Pain Location Vagina    Pain Orientation Mid    Pain Descriptors / Indicators Stabbing    Pain Type Acute pain    Pain Onset More than a month ago    Pain Frequency Intermittent    Aggravating Factors  attempt to have intercourse and unable to have the penis go into the vaginal canal    Pain Relieving Factors nothing inthe vaginal canal    Multiple Pain Sites No                [facility] PT Assessment - 09/16/21 0001       Assessment   Medical Diagnosis C50.512, Z17.1 Manignant neoplasmof lower outer quadrant of left breast female estrogen receptor negative    Referring Provider (PT) Dr. 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     Pelvic Floor Special Questions - 09/16/21 0001     Currently Sexually Active Yes    Is this Painful Yes    Marinoff  Scale pain prevents any attempts at intercourse    Urinary Leakage No    Urinary urgency No    Fecal incontinence No   diarrhea   Falling out feeling (prolapse) No    Skin Integrity Intact;Erthema    External Palpation Q-tip test with pain at 8:00 to 5:00 with redness in the area    Pelvic Floor Internal Exam Patient confirmed identfication and approves PT to assess pelvic floor and treatment    Exam Type Vaginal    Palpation placed a q-tip into the vaginal canal but was painful therefore stopped              [facility] Adult PT Treatment/Exercise - 09/16/21 0001       Self-Care   Self-Care Other Self-Care Comments    Other Self-Care Comments  educated patient about vaginal moisturizers and vaginal health. Discussed with patient to get in with her gynecologist earlier if she can                     PT Education - 09/16/21 1211     Education Details vaginal moisturizers    Person(s) Educated Patient    Methods Explanation;Handout    Comprehension Verbalized understanding              PT Short Term Goals - 09/16/21 1244       PT SHORT TERM GOAL #1   Title independent with hip stretches, diaphragmatic breathing    Time 4    Period Weeks    Status New    Target Date 10/14/21      PT SHORT TERM GOAL #2   Title understand vaginal moisturizers and how they help the vaginal dryness and pain    Time 4    Period Weeks    Status New    Target Date 10/14/21      PT SHORT TERM GOAL #3   Title understand ways to manage pain with meditation    Time 4    Period Weeks    Status New    Target Date 10/14/21      PT SHORT TERM GOAL #4   Title able to have the q-tip test without pain due to decreased sensitivity    Time 4    Period Weeks    Status New    Target Date 10/14/21               PT Long Term Goals - 09/16/21 1251  PT LONG TERM GOAL #2   Title independent with HEP for relaxation of the vaginal tissue so she is able to have vaginal  penetration    Time 12    Period Weeks    Status New    Target Date 12/09/21      PT LONG TERM GOAL #3   Title able to have penile penetration at [facility] score 1/3 due to improvement of the vaginal dryness and tissue irritation    Time 12    Period Weeks    Status New    Target Date 12/09/21      PT LONG TERM GOAL #4   Title understand lubricants that do not irritate the vaginal tissue to use with vaginal penetration    Time 12    Period Weeks    Status New    Target Date 12/09/21                    Plan - 09/16/21 1233     Clinical Impression Statement Patient is a 56 year old female with vaginal dryness post chemotherapy. she was diagnosised with breast cancer on 10/14/2020. She has had chemotherapy and radiation. She is now gettting immunotherapy.Patient reports her vaginal pain is 10/10 when she attempts to have vaginal penetration. Patient reports the penis is not able to enter the introitus due to pain. Marinoff score is 3/3. She has had the [facility] prior to her breast cancer diagonsis and was able to have penetrative intercourse. Q-tip test was painful from 8:00 to 5:00 with erythema along the vestibule. She had tenderness around the urethral opening. She had pain with the q-tip being placed into the vaginal canal so the therapist did not try doing an internal asssessment of the pelvic muscles. Patient has an appoinment with her gynecologist in March but will see if she is able to get in sooner. Patient will benefit from skilled therapy to reduce her pain and be able to have vaginal penetration for initmcy and vaginal exams.    Personal Factors and Comorbidities Sex;Time since onset of injury/illness/exacerbation;Past/Current Experience;Comorbidity 3+    Comorbidities breast cancer with treatment including radiation and chemotherapy; Hypothyroism    Examination-Activity Limitations Other   vaginal penetration for intimacy and vaginal exam   Stability/Clinical Decision  Making Stable/Uncomplicated    Clinical Decision Making Low    Rehab Potential Good    PT Frequency 1x / week    PT Duration 12 weeks    PT Treatment/Interventions ADLs/Self Care Home Management;Cryotherapy;Moist Heat;Electrical Stimulation;Ultrasound;Therapeutic activities;Therapeutic exercise;Neuromuscular re-education;Patient/family education;Manual techniques;Dry needling    PT Next Visit Plan pelvic mediation; stretches for hips that stretch the pelvic floor bieing careful of her back; diaphragmatic breathing; manual work to hamstring, hip adductors and quads    Consulted and Agree with Plan of Care Patient             Patient will benefit from skilled therapeutic intervention in order to improve the following deficits and impairments:  Decreased coordination, Pain, Increased fascial restricitons, Decreased activity tolerance, Increased muscle spasms  Visit Diagnosis: Aftercare following surgery for neoplasm - Plan: PT plan of care cert/re-cert  Cramp and spasm - Plan: PT plan of care cert/re-cert     Problem List Patient Active Problem List   Diagnosis Date Noted   Port-A-Cath in place 01/07/2021   Family history of breast cancer    Family history of prostate cancer    Family history of stomach cancer    Malignant neoplasm  of lower-outer quadrant of left breast of female, estrogen receptor negative ([place]) 11/09/2020   Atrial fibrillation ([place]) 10/01/2019   Disorder of thyroid gland 10/01/2019   Genetic testing 09/22/2017    Earlie Counts, PT 09/16/21 12:57 PM  [facility] at [facility] [address] [place], [place], [address] Phone: [phone]   Fax:  [phone]  Name: INDY PRESTWOOD MRN: [id number] Date of Birth: Jan 06, 1966

## 2021-09-21 ENCOUNTER — Encounter: Payer: Managed Care, Other (non HMO) | Admitting: Physical Therapy

## 2021-09-30 ENCOUNTER — Encounter: Payer: Self-pay | Admitting: Physical Therapy

## 2021-09-30 ENCOUNTER — Other Ambulatory Visit: Payer: Self-pay

## 2021-09-30 ENCOUNTER — Ambulatory Visit: Payer: Managed Care, Other (non HMO) | Admitting: Physical Therapy

## 2021-09-30 DIAGNOSIS — R252 Cramp and spasm: Secondary | ICD-10-CM

## 2021-09-30 DIAGNOSIS — Z483 Aftercare following surgery for neoplasm: Secondary | ICD-10-CM | POA: Diagnosis not present

## 2021-09-30 NOTE — Therapy (Signed)
[place] at [facility] [address] [place], [place], [address] Phone: [phone]   Fax:  [phone]  Physical Therapy Treatment  Patient Details  Name: Kristina Harvey MRN: [id number] Date of Birth: 1965/12/23 Referring Provider (PT): Dr. Ursula Alert   Encounter Date: 09/30/2021   PT End of Session - 09/30/21 0925     Visit Number 4   2  pelvic, 2 breast   Date for PT Re-Evaluation 12/09/21   11/19/21 pelvic;   Authorization Type [employer]    PT Start Time 0830    PT Stop Time 0920    PT Time Calculation (min) 50 min    Activity Tolerance Patient tolerated treatment well;Patient limited by pain    Behavior During Therapy [facility] for tasks assessed/performed             Past Medical History:  Diagnosis Date   Arrhythmia    paroxysmal atrial fibrillation   Cancer [facility])    Family history of breast cancer    Family history of prostate cancer    Family history of stomach cancer    History of radiation therapy    left breast and subclavian  05/18/2021-07/01/2021  Dr Gery Pray   Hyperlipidemia    Hypothyroidism    Thyroid disease     Past Surgical History:  Procedure Laterality Date   BREAST LUMPECTOMY WITH RADIOACTIVE SEED AND SENTINEL LYMPH NODE BIOPSY Left 04/16/2021   Procedure: LEFT BREAST LUMPECTOMY WITH RADIOACTIVE SEED AND SENTINEL LYMPH NODE BIOPSY;  Surgeon: Jovita Kussmaul, MD;  Location: [facility];  Service: General;  Laterality: Left;   chin implant  1996   IR [place]  11/24/2020   RADIOACTIVE SEED GUIDED AXILLARY SENTINEL LYMPH NODE Left 04/16/2021   Procedure: RADIOACTIVE SEED GUIDED AXILLARY SENTINEL LYMPH NODE DISSECTION;  Surgeon: Jovita Kussmaul, MD;  Location: [facility];  Service: General;  Laterality: Left;    There were no vitals filed for this visit.   Subjective Assessment - 09/30/21 0835     Subjective I have been using the creams. I was using Lume for deodarant on the vaginal area and  have stopped.    Pertinent History Patient was diagnosed on 10/14/2020 with left grade III invasive ductal carcinoma. She  underwent neoadjuvant chemotherapy from 11/27/220-03/12/2021 followed by a left lumpectomy and sentinel node biopsy (3 negative nodes) on 04/16/2021. It is ER/PR negative and HER2 positive with a Ki67 of 20%.    Patient Stated Goals vaginal dryness    Currently in Pain? Yes    Pain Score 10-Worst pain ever    Pain Location Vagina    Pain Orientation Mid    Pain Descriptors / Indicators Stabbing    Pain Type Acute pain    Pain Onset More than a month ago    Pain Frequency Intermittent    Aggravating Factors  attempt to have intercourse and unable to have the penis go into the vaginal canal    Pain Relieving Factors Nothing in the vaginal canal    Multiple Pain Sites No                               [facility] Adult PT Treatment/Exercise - 09/30/21 0001       Self-Care   Self-Care Other Self-Care Comments    Other Self-Care Comments  educated pateint on perineal massage andhow to massage around the vulva area to be  used to touching the area; educated patient on how to be used to him touching her by only giving massages outside the vaginal area      Neuro Re-ed    Neuro Re-ed Details  diaphragmatic breathing while therapist was performing manual work to the thighs      Lumbar Exercises: Stretches   Active Hamstring Stretch Right;Left;1 rep;30 seconds    Active Hamstring Stretch Limitations supine with strap    ITB Stretch Right;Left;1 rep;30 seconds    ITB Stretch Limitations supine with strap    Other Lumbar Stretch Exercise one legged butterfly stretch holding for 30 seconds, right , left      Lumbar Exercises: Quadruped   [place] Horse 10 reps    Other Quadruped Lumbar Exercises hip hinge with hips internall rotated 10x      Manual Therapy   Manual Therapy Soft tissue mobilization    Soft tissue mobilization to bilateral hamstring, quadricep,  andhip adductors                     PT Education - 09/30/21 0922     Education Details Access Code: [email or web address]; you tube videos for perineal massge and pelvic floor meditation    Person(s) Educated Patient    Methods Explanation;Demonstration;Handout    Comprehension Returned demonstration;Verbalized understanding              PT Short Term Goals - 09/30/21 0930       PT SHORT TERM GOAL #1   Title independent with hip stretches, diaphragmatic breathing    Time 4    Period Weeks    Status On-going      PT SHORT TERM GOAL #2   Title understand vaginal moisturizers and how they help the vaginal dryness and pain    Time 4    Period Weeks    Status On-going      PT SHORT TERM GOAL #3   Title understand ways to manage pain with meditation    Time 4    Period Weeks    Status Achieved      PT SHORT TERM GOAL #4   Title able to have the q-tip test without pain due to decreased sensitivity    Time 4    Period Weeks    Status On-going               PT Long Term Goals - 09/16/21 1251       PT LONG TERM GOAL #2   Title independent with HEP for relaxation of the vaginal tissue so she is able to have vaginal penetration    Time 12    Period Weeks    Status New    Target Date 12/09/21      PT LONG TERM GOAL #3   Title able to have penile penetration at [facility] score 1/3 due to improvement of the vaginal dryness and tissue irritation    Time 12    Period Weeks    Status New    Target Date 12/09/21      PT LONG TERM GOAL #4   Title understand lubricants that do not irritate the vaginal tissue to use with vaginal penetration    Time 12    Period Weeks    Status New    Target Date 12/09/21                   Plan - 09/30/21 0927     Clinical Impression Statement  Patient was anxious during the treatment so therapist stayed external. She has tightness in the hip adductors and hamstring. She was able to perform diaphragmatic breathing and  felt the pelvic floor relax. Patient has been using creams that were given as samples and they are helping. She has ordered creams and waiting for them to come in. Patient will benefit from skilled therapy to reduce her pain and be able to have vaginal penetration for intimacy and vaginal exam.    Personal Factors and Comorbidities Sex;Time since onset of injury/illness/exacerbation;Past/Current Experience;Comorbidity 3+    Comorbidities breast cancer with treatment including radiation and chemotherapy; Hypothyroism    Examination-Activity Limitations Other   vaginal penetration for intimacy and vaginal exam   Stability/Clinical Decision Making Stable/Uncomplicated    Rehab Potential Good    PT Frequency 1x / week    PT Duration 12 weeks    PT Treatment/Interventions ADLs/Self Care Home Management;Cryotherapy;Moist Heat;Electrical Stimulation;Ultrasound;Therapeutic activities;Therapeutic exercise;Neuromuscular re-education;Patient/family education;Manual techniques;Dry needling    PT Next Visit Plan diaphragmatic breathing, manuao work to [employer] thigh and by the pelvic bone, work around the vulva, urogenital release    PT Home Exercise Plan Post op shoulder ROM HEP    Recommended Other Services Access Code: [email or web address]    [email or web address] and Agree with Plan of Care Patient             Patient will benefit from skilled therapeutic intervention in order to improve the following deficits and impairments:  Decreased coordination, Pain, Increased fascial restricitons, Decreased activity tolerance, Increased muscle spasms  Visit Diagnosis: Aftercare following surgery for neoplasm  Cramp and spasm     Problem List Patient Active Problem List   Diagnosis Date Noted   Port-A-Cath in place 01/07/2021   Family history of breast cancer    Family history of prostate cancer    Family history of stomach cancer    Malignant neoplasm of lower-outer quadrant of left breast of female, estrogen receptor  negative ([place]) 11/09/2020   Atrial fibrillation ([place]) 10/01/2019   Disorder of thyroid gland 10/01/2019   Genetic testing 09/22/2017    Earlie Counts, PT 09/30/21 9:31 AM  [place] at [facility] [address] [place], [place], [address] Phone: [phone]   Fax:  [phone]  Name: Kristina Harvey MRN: [id number] Date of Birth: February 01, 1966

## 2021-09-30 NOTE — Patient Instructions (Signed)
Access Code: [email or web address] URL: [email or web address] Date: 09/30/2021 Prepared by: Earlie Counts  Exercises Supine Hamstring Stretch with Strap - 1 x daily - 7 x weekly - 1 sets - 2 reps - 30 sec hold Supine ITB Stretch with Strap - 1 x daily - 7 x weekly - 1 sets - 2 reps - 30 sec hold Supine Pelvic Floor Stretch - 1 x daily - 7 x weekly - 1 sets - 2 reps - 30 sec hold Cat Cow - 1 x daily - 7 x weekly - 1 sets - 10 reps Hip Hinge Rock Back - 1 x daily - 7 x weekly - 1 sets - 10 reps Earlie Counts, PT [facility] [address], [place], [place] [address] W: [phone] [email or web address]

## 2021-10-01 ENCOUNTER — Other Ambulatory Visit: Payer: Self-pay

## 2021-10-01 DIAGNOSIS — C50512 Malignant neoplasm of lower-outer quadrant of left female breast: Secondary | ICD-10-CM

## 2021-10-02 NOTE — Progress Notes (Signed)
Patient Care Team: Alroy Dust, L.Marlou Sa, MD as PCP - General (Family Medicine) Jovita Kussmaul, MD as Consulting Physician (General Surgery) Nicholas Lose, MD as Consulting Physician (Hematology and Oncology) Gery Pray, MD as Consulting Physician (Radiation Oncology) Mauro Kaufmann, RN as Oncology Nurse Navigator Rockwell Germany, RN as Oncology Nurse Navigator  DIAGNOSIS:    ICD-10-CM   1. Malignant neoplasm of lower-outer quadrant of left breast of female, estrogen receptor negative ([place])  C50.512    Z17.1       SUMMARY OF ONCOLOGIC HISTORY: Oncology History  Malignant neoplasm of lower-outer quadrant of left breast of female, estrogen receptor negative ([place])  11/04/2020 Initial Diagnosis   Screening mammogram showed a possible left breast mass and enlarged axillary lymph nodes. Diagnostic mammogram and US showed a 1.4cm mass at the 3:30 position in the left breast and 2 abnormal lymph nodes in the left axilla. 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   11/11/2020 Cancer Staging   Staging form: Breast, AJCC 8th Edition - Clinical stage from 11/11/2020: Stage IIA (cT1c, cN1, cM0, G3, ER-, PR-, HER2+) - Signed by Nicholas Lose, MD on 11/11/2020 Stage prefix: Initial diagnosis Histologic grading system: 3 grade system Laterality: Left Staged by: Pathologist and managing physician Stage used in treatment planning: Yes National guidelines used in treatment planning: Yes Type of national guideline used in treatment planning: NCCN    11/27/2020 - 04/02/2021 Chemotherapy   TCHP x6 cycles       04/16/2021 Surgery   Left lumpectomy 04/16/2021: Pathologic complete response, 0/3 lymph nodes negative   05/03/2021 -  Chemotherapy   Patient is on Treatment Plan : BREAST Trastuzumab + Pertuzumab q21d     05/20/2021 - 07/01/2021 Radiation Therapy   Adjuvant XRT     CHIEF COMPLIANT: Herceptin Perjeta  maintenance  INTERVAL HISTORY: Kristina Harvey is a 56 y.o. with above-mentioned history of breast cancer having completed neoadjuvant chemotherapy with [place] and radiation, currently on maintenance with Herceptin Perjeta. She presents to the clinic today for treatment.  She continues to have peripheral neuropathy symptoms in her hands and she rates it as 4 out of 10.  Denies any other side effects to prior treatment.  Muscle mass has come down and she is trying to work out and get stronger.  Denies any side effects to Herceptin and Perjeta.  She was on gabapentin for breast related pain but she stopped it a week ago because it was not working.  It was also making her gain weight.  ALLERGIES:  is allergic to caffeine.  MEDICATIONS:  Current Outpatient Medications  Medication Sig Dispense Refill   acetaminophen (TYLENOL) 500 MG tablet Take 1,000 mg by mouth every 6 (six) hours as needed for moderate pain.     ALPRAZolam (XANAX) 0.25 MG tablet Take 0.25 mg by mouth at bedtime as needed for sleep.     colestipol (COLESTID) 1 g tablet Take 2 g by mouth at bedtime.     fluorouracil (EFUDEX) 5 % cream Apply 1 application topically daily. (Patient not taking: Reported on 07/20/2021)     gabapentin (NEURONTIN) 100 MG capsule Take 1 capsule (100 mg total) by mouth daily. 30 capsule 0   gabapentin (NEURONTIN) 300 MG capsule Take 1 capsule (300 mg total) by mouth at bedtime. (Patient not taking: Reported on 09/09/2021) 30 capsule 0   levothyroxine (SYNTHROID) 75 MCG tablet Take 75 mcg by  mouth daily before breakfast.     metoprolol succinate (TOPROL-XL) 25 MG 24 hr tablet Take 1 tablet (25 mg total) by mouth daily. 90 tablet 3   metoprolol tartrate (LOPRESSOR) 25 MG tablet Take 1 tablet (25 mg total) by mouth 2 (two) times daily as needed (palpitations). 60 tablet 3   Multiple Vitamin (MULTIVITAMIN WITH MINERALS) TABS tablet Take 1 tablet by mouth daily.     Polyethyl Glycol-Propyl Glycol (SYSTANE OP)  Place 1 drop into both eyes 2 (two) times daily.     Probiotic Product (PROBIOTIC PO) Take 1 capsule by mouth daily.     Psyllium (METAMUCIL PO) Take 1 Scoop by mouth daily. (Patient not taking: Reported on 09/09/2021)     No current facility-administered medications for this visit.    PHYSICAL EXAMINATION: ECOG PERFORMANCE STATUS: 1 - Symptomatic but completely ambulatory  Vitals:   10/04/21 1014  BP: 119/64  Pulse: 87  Resp: 18  Temp: (!) 97.5 F (36.4 C)  SpO2: 100%   Filed Weights   10/04/21 1014  Weight: 165 lb 11.2 oz (75.2 kg)      LABORATORY DATA:  I have reviewed the data as listed CMP Latest Ref Rng & Units 09/13/2021 08/23/2021 07/12/2021  Glucose 70 - 99 mg/dL 163(H) 107(H) 116(H)  BUN 6 - 20 mg/dL _0 Creatinine 0.44 - 1.00 mg/dL 0.69 0.65 0.72  Sodium 135 - 145 mmol/L 138 137 141  Potassium 3.5 - 5.1 mmol/L 4.2 4.0 4.4  Chloride 98 - 111 mmol/L 104 105 105  CO2 22 - 32 mmol/L _1 Calcium 8.9 - 10.3 mg/dL 9.0 8.7(L) 9.2  Total Protein 6.5 - 8.1 g/dL 7.2 6.9 7.5  Total Bilirubin 0.3 - 1.2 mg/dL 0.5 0.5 0.7  Alkaline Phos 38 - 126 U/L 58 57 72  AST 15 - 41 U/L _2 ALT 0 - 44 U/L _3 Lab Results  Component Value Date   WBC 5.2 10/04/2021   HGB 12.7 10/04/2021   HCT 37.9 10/04/2021   MCV 101.6 (H) 10/04/2021   PLT 207 10/04/2021   NEUTROABS 3.7 10/04/2021    ASSESSMENT & PLAN:  Malignant neoplasm of lower-outer quadrant of left breast of female, estrogen receptor negative ([place]) 11/04/2020:Screening mammogram showed a possible left breast mass and enlarged axillary lymph nodes. Diagnostic mammogram and US showed a 1.4cm mass at the 3:30 position in the left breast and 2 abnormal lymph nodes in the left axilla. 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   Treatment Plan: 1. Neoadjuvant chemotherapy with TCH Perjeta 6 cycles completed 03/12/2021  followed by Herceptin Perjeta maintenance versus Kadcyla maintenance (based on response to neoadjuvant chemo) for 1 year 2. Left lumpectomy 04/16/2021: Pathologic complete response, 0/3 lymph nodes negative 3. Followed by adjuvant radiation therapy 05/19/2021-07/01/2021 4.  Consideration for neratinib ------------------------------------------------------------------------------------------------------------------------------- Current Treatment: Completed 6 cycles of (11/27/20-03/12/2021), currently on Herceptin Perjeta maintenance Herceptin Perjeta toxicities: Diarrhea has resolved   Return to clinic for Herceptin Perjeta maintenance We discussed the role of neratinib briefly.   After much discussion she decided that she does not want to take it.   Breast Cancer Surveillance: Mammogram scheduled for 11/03/2021 Echocardiogram 09/10/2021: Patient is experiencing tachycardia of unknown reason: LVEF 55 to 60%   Peripheral neuropathy: 4/10: I recommended participation in the phase 2 ACCRU [place] 2102 study of N-Palmitoylrthanolamide vs placebo for  the treatment of chemo induced peripheral neuropathy.  Every 3 weeks for Herceptin Perjeta every 6 weeks to follow-up with me.    No orders of the defined types were placed in this encounter.  The patient has a good understanding of the overall plan. she agrees with it. she will call with any problems that may develop before the next visit here.  Total time spent: 30 mins including face to face time and time spent for planning, charting and coordination of care  Rulon Eisenmenger, MD, MPH 10/04/2021  I, Thana Ates, am acting as scribe for Dr. Nicholas Lose.  I have reviewed the above documentation for accuracy and completeness, and I agree with the above.

## 2021-10-04 ENCOUNTER — Inpatient Hospital Stay: Payer: Managed Care, Other (non HMO)

## 2021-10-04 ENCOUNTER — Inpatient Hospital Stay: Payer: Managed Care, Other (non HMO) | Attending: Hematology and Oncology

## 2021-10-04 ENCOUNTER — Other Ambulatory Visit: Payer: Self-pay

## 2021-10-04 ENCOUNTER — Inpatient Hospital Stay: Payer: Managed Care, Other (non HMO) | Admitting: Emergency Medicine

## 2021-10-04 ENCOUNTER — Inpatient Hospital Stay (HOSPITAL_BASED_OUTPATIENT_CLINIC_OR_DEPARTMENT_OTHER): Payer: Managed Care, Other (non HMO) | Admitting: Hematology and Oncology

## 2021-10-04 VITALS — BP 111/72 | HR 88 | Temp 98.4°F | Resp 20

## 2021-10-04 DIAGNOSIS — Z171 Estrogen receptor negative status [ER-]: Secondary | ICD-10-CM

## 2021-10-04 DIAGNOSIS — C50512 Malignant neoplasm of lower-outer quadrant of left female breast: Secondary | ICD-10-CM

## 2021-10-04 DIAGNOSIS — Z95828 Presence of other vascular implants and grafts: Secondary | ICD-10-CM

## 2021-10-04 DIAGNOSIS — Z5111 Encounter for antineoplastic chemotherapy: Secondary | ICD-10-CM | POA: Diagnosis present

## 2021-10-04 DIAGNOSIS — Z5112 Encounter for antineoplastic immunotherapy: Secondary | ICD-10-CM | POA: Diagnosis present

## 2021-10-04 DIAGNOSIS — G629 Polyneuropathy, unspecified: Secondary | ICD-10-CM | POA: Insufficient documentation

## 2021-10-04 LAB — CBC WITH DIFFERENTIAL (CANCER CENTER ONLY)
Abs Immature Granulocytes: 0.01 10*3/uL (ref 0.00–0.07)
Basophils Absolute: 0 10*3/uL (ref 0.0–0.1)
Basophils Relative: 0 %
Eosinophils Absolute: 0.1 10*3/uL (ref 0.0–0.5)
Eosinophils Relative: 2 %
HCT: 37.9 % (ref 36.0–46.0)
Hemoglobin: 12.7 g/dL (ref 12.0–15.0)
Immature Granulocytes: 0 %
Lymphocytes Relative: 15 %
Lymphs Abs: 0.8 10*3/uL (ref 0.7–4.0)
MCH: 34 pg (ref 26.0–34.0)
MCHC: 33.5 g/dL (ref 30.0–36.0)
MCV: 101.6 fL — ABNORMAL HIGH (ref 80.0–100.0)
Monocytes Absolute: 0.6 10*3/uL (ref 0.1–1.0)
Monocytes Relative: 11 %
Neutro Abs: 3.7 10*3/uL (ref 1.7–7.7)
Neutrophils Relative %: 72 %
Platelet Count: 207 10*3/uL (ref 150–400)
RBC: 3.73 MIL/uL — ABNORMAL LOW (ref 3.87–5.11)
RDW: 11.7 % (ref 11.5–15.5)
WBC Count: 5.2 10*3/uL (ref 4.0–10.5)
nRBC: 0 % (ref 0.0–0.2)

## 2021-10-04 LAB — CMP (CANCER CENTER ONLY)
ALT: 17 U/L (ref 0–44)
AST: 19 U/L (ref 15–41)
Albumin: 4 g/dL (ref 3.5–5.0)
Alkaline Phosphatase: 57 U/L (ref 38–126)
Anion gap: 3 — ABNORMAL LOW (ref 5–15)
BUN: 16 mg/dL (ref 6–20)
CO2: 29 mmol/L (ref 22–32)
Calcium: 9 mg/dL (ref 8.9–10.3)
Chloride: 106 mmol/L (ref 98–111)
Creatinine: 0.61 mg/dL (ref 0.44–1.00)
GFR, Estimated: >60 mL/min (ref >60)
Glucose, Bld: 101 mg/dL — ABNORMAL HIGH (ref 70–99)
Potassium: 4.2 mmol/L (ref 3.5–5.1)
Sodium: 138 mmol/L (ref 135–145)
Total Bilirubin: 0.3 mg/dL (ref 0.3–1.2)
Total Protein: 7.1 g/dL (ref 6.5–8.1)

## 2021-10-04 MED ORDER — HEPARIN SOD (PORK) LOCK FLUSH 100 UNIT/ML IV SOLN
500.0000 [IU] | Freq: Once | INTRAVENOUS | Status: AC | PRN
  Administered 2021-10-04: 500 [IU]

## 2021-10-04 MED ORDER — TRASTUZUMAB-ANNS CHEMO 150 MG IV SOLR
6.0000 mg/kg | Freq: Once | INTRAVENOUS | Status: AC
  Administered 2021-10-04: 441 mg via INTRAVENOUS
  Filled 2021-10-04: qty 21

## 2021-10-04 MED ORDER — SODIUM CHLORIDE 0.9 % IV SOLN: Freq: Once | INTRAVENOUS | Status: AC

## 2021-10-04 MED ORDER — ACETAMINOPHEN 325 MG PO TABS
650.0000 mg | ORAL_TABLET | Freq: Once | ORAL | Status: AC
  Administered 2021-10-04: 650 mg via ORAL

## 2021-10-04 MED ORDER — SODIUM CHLORIDE 0.9 % IV SOLN
420.0000 mg | Freq: Once | INTRAVENOUS | Status: AC
  Administered 2021-10-04: 420 mg via INTRAVENOUS
  Filled 2021-10-04: qty 14

## 2021-10-04 MED ORDER — DIPHENHYDRAMINE HCL 25 MG PO CAPS
25.0000 mg | ORAL_CAPSULE | Freq: Once | ORAL | Status: AC
  Administered 2021-10-04: 25 mg via ORAL

## 2021-10-04 MED ORDER — DIPHENHYDRAMINE HCL 25 MG PO CAPS: 25.0000 mg | ORAL_CAPSULE | Freq: Once | ORAL | Status: DC

## 2021-10-04 MED ORDER — SODIUM CHLORIDE 0.9% FLUSH
10.0000 mL | Freq: Once | INTRAVENOUS | Status: AC
  Administered 2021-10-04: 10 mL

## 2021-10-04 MED ORDER — ACETAMINOPHEN 325 MG PO TABS: 650.0000 mg | ORAL_TABLET | Freq: Once | ORAL | Status: DC

## 2021-10-04 MED ORDER — SODIUM CHLORIDE 0.9% FLUSH
10.0000 mL | INTRAVENOUS | Status: DC | PRN
  Administered 2021-10-04: 10 mL

## 2021-10-04 NOTE — Research (Signed)
ACCRU-[place]-2102 - TREATMENT OF ESTABLISHED CHEMOTHERAPY-INDUCED NEUROPATHY WITH N-PALMITOYLETHANOLAMIDE, A CANNABIMIMETIC NUTRACEUTICAL: A RANDOMIZED DOUBLE-BLIND PHASE II PILOT TRIAL  INTRO STUDY/CONSENTS  Patient Kristina Harvey was identified by MD Lindi Adie as a potential candidate for the above listed study.  This Clinical Research Nurse met with Kristina Harvey, Kristina Harvey, on 10/04/21 in a manner and location that ensures patient privacy to discuss participation in the above listed research study.  Patient is Unaccompanied.  A copy of the informed consent document with embedded HIPAA language was provided to the patient.  Patient reads, speaks, and understands [place].   Patient was provided with the business card of this Nurse and encouraged to contact the research team with any questions.  Approximately 15 minutes were spent with the patient reviewing the informed consent documents.  Patient was provided the option of taking informed consent documents home to review and was encouraged to review at their convenience with their support network, including other care providers. Patient took the consent documents home to review.  Will f/u with patient in next few days to determine interest.  Wells Guiles 'Donell Sievert, RN, BSN Clinical Research Nurse I 10/04/21 11:49 AM

## 2021-10-04 NOTE — Assessment & Plan Note (Signed)
11/04/2020:Screening mammogram showed a possible left breast mass and enlarged axillary lymph nodes. Diagnostic mammogram and US showed a 1.4cm mass at the 3:30 position in the left breast and 2 abnormal lymph nodes in the left axilla. 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  Treatment Plan: 1.Neoadjuvant chemotherapy with [place] Perjeta 6 cyclescompleted 7/29/2022followed by Herceptin Perjeta maintenance versus Kadcyla maintenance (based on response to neoadjuvant chemo) for 1 year 2. Left lumpectomy 04/16/2021: Pathologic complete response, 0/3 lymph nodes negative 3. Followed by adjuvant radiation therapy10/12/2020-07/01/2021 4.Consideration for neratinib ------------------------------------------------------------------------------------------------------------------------------- Current Treatment:Completed 6 cycles of(11/27/20-03/12/2021), currently on Herceptin Perjeta maintenance Herceptin Perjeta toxicities:Diarrhea has resolved  Return to clinicforHerceptin Perjeta maintenance We discussed the role of neratinib briefly.  After much discussion she decided that she does not want to take it.  Breast Cancer Surveillance: Mammogram scheduled for 11/03/2021 Echocardiogram 09/10/2021: Patient is experiencing tachycardia of unknown reason: LVEF 55 to 60%  Every 3 weeks for Herceptin Perjeta every 6 weeks to follow-up with me.

## 2021-10-04 NOTE — Progress Notes (Signed)
Patient declines to stay for 30 minute post-perjeta wait.

## 2021-10-07 ENCOUNTER — Other Ambulatory Visit: Payer: Self-pay

## 2021-10-07 ENCOUNTER — Encounter: Payer: Managed Care, Other (non HMO) | Admitting: Physical Therapy

## 2021-10-07 ENCOUNTER — Encounter: Payer: Self-pay | Admitting: Physical Therapy

## 2021-10-07 ENCOUNTER — Telehealth: Payer: Self-pay | Admitting: Emergency Medicine

## 2021-10-07 DIAGNOSIS — Z483 Aftercare following surgery for neoplasm: Secondary | ICD-10-CM

## 2021-10-07 DIAGNOSIS — R252 Cramp and spasm: Secondary | ICD-10-CM

## 2021-10-07 NOTE — Therapy (Signed)
[place] at [facility] [address] [place], [place], [address] Phone: [phone]   Fax:  [phone]  Physical Therapy Treatment  Patient Details  Name: Kristina Harvey MRN: [id number] Date of Birth: 1966-05-28 Referring Provider (PT): Dr. Ursula Alert   Encounter Date: 10/07/2021   PT End of Session - 10/07/21 0920     Visit Number 5   3 pelvic, 2 breast   Date for PT Re-Evaluation 12/09/21   breaset is 05/14/2021   Authorization Type [employer]    PT Start Time 0830    PT Stop Time 0915    PT Time Calculation (min) 45 min    Activity Tolerance Patient tolerated treatment well    Behavior During Therapy [facility] for tasks assessed/performed             Past Medical History:  Diagnosis Date   Arrhythmia    paroxysmal atrial fibrillation   Cancer [facility])    Family history of breast cancer    Family history of prostate cancer    Family history of stomach cancer    History of radiation therapy    left breast and subclavian  05/18/2021-07/01/2021  Dr Gery Pray   Hyperlipidemia    Hypothyroidism    Thyroid disease     Past Surgical History:  Procedure Laterality Date   BREAST LUMPECTOMY WITH RADIOACTIVE SEED AND SENTINEL LYMPH NODE BIOPSY Left 04/16/2021   Procedure: LEFT BREAST LUMPECTOMY WITH RADIOACTIVE SEED AND SENTINEL LYMPH NODE BIOPSY;  Surgeon: Jovita Kussmaul, MD;  Location: [place];  Service: General;  Laterality: Left;   chin implant  1996   IR [place]  11/24/2020   RADIOACTIVE SEED GUIDED AXILLARY SENTINEL LYMPH NODE Left 04/16/2021   Procedure: RADIOACTIVE SEED GUIDED AXILLARY SENTINEL LYMPH NODE DISSECTION;  Surgeon: Jovita Kussmaul, MD;  Location: [place];  Service: General;  Laterality: Left;    There were no vitals filed for this visit.   Subjective Assessment - 10/07/21 0835     Subjective I have not been able to do the exerciess 1 time. I do not feel as dry.    Pertinent History Patient was  diagnosed on 10/14/2020 with left grade III invasive ductal carcinoma. She  underwent neoadjuvant chemotherapy from 11/27/220-03/12/2021 followed by a left lumpectomy and sentinel node biopsy (3 negative nodes) on 04/16/2021. It is ER/PR negative and HER2 positive with a Ki67 of 20%.    Patient Stated Goals vaginal dryness    Currently in Pain? Yes    Pain Score 10-Worst pain ever    Pain Location Vagina    Pain Orientation Mid    Pain Descriptors / Indicators Stabbing    Pain Type Acute pain    Pain Onset More than a month ago    Pain Frequency Intermittent    Aggravating Factors  attempt to have intercourse and unable to have the penis go into the vaginal canal    Pain Relieving Factors Nothing in the vaginal canal    Multiple Pain Sites No                               [facility] Adult PT Treatment/Exercise - 10/07/21 0001       Self-Care   Self-Care Other Self-Care Comments    Other Self-Care Comments  educated patient on ho wto use the addaday to massage her thigh muscles and gluteals, and educated her on  using her vaginal massager to work on the vulva area to improve blood flow and relax muscles      Neuro Re-ed    Neuro Re-ed Details  diaphragmatic breathing with tactile and verbal cues to expand the abdomen      Lumbar Exercises: Stretches   Active Hamstring Stretch Right;Left;1 rep;30 seconds    Active Hamstring Stretch Limitations supine with strap    ITB Stretch Right;Left;1 rep;30 seconds    ITB Stretch Limitations supine with strap    Other Lumbar Stretch Exercise one legged butterfly stretch holding for 30 seconds, right , left      Manual Therapy   Manual Therapy Soft tissue mobilization;Myofascial release    Soft tissue mobilization using the addaday to bilateral quadriceps, ITB, and hi padductors    Myofascial Release urogenital releasae alon gth ppubic rami going through the layers of fascia while patient performed diaphragmatic breathing to feel the  pelvic floor drop                     PT Education - 10/07/21 0919     Education Details educated patient on using the addaday to the thigh muscles and using the vaginal massager to the vulva    Person(s) Educated Patient    Methods Explanation    Comprehension Verbalized understanding              PT Short Term Goals - 10/07/21 0924       PT SHORT TERM GOAL #1   Title independent with hip stretches, diaphragmatic breathing    Time 4    Period Weeks    Status Achieved      PT SHORT TERM GOAL #2   Title understand vaginal moisturizers and how they help the vaginal dryness and pain    Time 4    Period Weeks    Status Achieved      PT SHORT TERM GOAL #3   Title understand ways to manage pain with meditation    Time 4    Period Weeks    Status Achieved      PT SHORT TERM GOAL #4   Title able to have the q-tip test without pain due to decreased sensitivity    Time 4    Period Weeks    Status On-going               PT Long Term Goals - 09/16/21 1251       PT LONG TERM GOAL #2   Title independent with HEP for relaxation of the vaginal tissue so she is able to have vaginal penetration    Time 12    Period Weeks    Status New    Target Date 12/09/21      PT LONG TERM GOAL #3   Title able to have penile penetration at [facility] score 1/3 due to improvement of the vaginal dryness and tissue irritation    Time 12    Period Weeks    Status New    Target Date 12/09/21      PT LONG TERM GOAL #4   Title understand lubricants that do not irritate the vaginal tissue to use with vaginal penetration    Time 12    Period Weeks    Status New    Target Date 12/09/21                   Plan - 10/07/21 6010     Clinical  Impression Statement Patient had a busy schedule and was not able to work on her home program. This week will be slower so she will be able to do the home program. She is letting the therapist work on the pelvic floro externally  and be able to relax. She is noticing less dryness vaginally. Patient did not have pain with the external pelvic floor work. Patient reported to therapist she has never had a vaginal exam. Patient will benefit from skilled therapy to reduce her pain and be able to have vaginal penetration for intimacy and vaginal exam.    Personal Factors and Comorbidities Sex;Time since onset of injury/illness/exacerbation;Past/Current Experience;Comorbidity 3+    Comorbidities breast cancer with treatment including radiation and chemotherapy; Hypothyroism    Examination-Activity Limitations Other   vaginal penetration, vaginal exam   Stability/Clinical Decision Making Stable/Uncomplicated    Rehab Potential Good    PT Frequency 1x / week    PT Duration 12 weeks    PT Treatment/Interventions ADLs/Self Care Home Management;Cryotherapy;Moist Heat;Electrical Stimulation;Ultrasound;Therapeutic activities;Therapeutic exercise;Neuromuscular re-education;Patient/family education;Manual techniques;Dry needling    PT Next Visit Plan urogenital release, manual work to the vulva area    PT Home Exercise Plan Post op shoulder ROM HEP    Recommended Other Services Access Code: [email or web address]    [id number] and Agree with Plan of Care Patient             Patient will benefit from skilled therapeutic intervention in order to improve the following deficits and impairments:  Decreased coordination, Pain, Increased fascial restricitons, Decreased activity tolerance, Increased muscle spasms  Visit Diagnosis: Aftercare following surgery for neoplasm  Cramp and spasm     Problem List Patient Active Problem List   Diagnosis Date Noted   Port-A-Cath in place 01/07/2021   Family history of breast cancer    Family history of prostate cancer    Family history of stomach cancer    Malignant neoplasm of lower-outer quadrant of left breast of female, estrogen receptor negative ([place]) 11/09/2020   Atrial fibrillation ([place])  10/01/2019   Disorder of thyroid gland 10/01/2019   Genetic testing 09/22/2017    Earlie Counts, PT 10/07/21 9:26 AM  [place] at [facility] [address], [place], [place], [address] Phone: [phone]   Fax:  [phone]  Name: Kristina Harvey MRN: [id number] Date of Birth: 18-Jan-1966

## 2021-10-07 NOTE — Telephone Encounter (Signed)
[id number] - TREATMENT OF ESTABLISHED CHEMOTHERAPY-INDUCED NEUROPATHY WITH N-PALMITOYLETHANOLAMIDE, A CANNABIMIMETIC NUTRACEUTICAL: A RANDOMIZED DOUBLE-BLIND PHASE II PILOT TRIAL  Called to f/u on pt's potential interest in study, pt declined as she does not want potential placebo pill.  Patient requested and was provided with information on commercially available PEA supplement.  Pt denies any questions or concerns at this time but is aware to f/u as needed.  Wells Guiles 'Learta CoddingNeysa Bonito, RN, BSN Clinical Research Nurse I 10/07/21 10:23 AM

## 2021-10-14 ENCOUNTER — Encounter: Payer: Managed Care, Other (non HMO) | Admitting: Physical Therapy

## 2021-10-25 ENCOUNTER — Other Ambulatory Visit: Payer: Self-pay | Admitting: *Deleted

## 2021-10-25 ENCOUNTER — Ambulatory Visit: Payer: Managed Care, Other (non HMO) | Attending: General Surgery

## 2021-10-25 ENCOUNTER — Other Ambulatory Visit: Payer: Self-pay

## 2021-10-25 VITALS — Wt 163.4 lb

## 2021-10-25 DIAGNOSIS — N63 Unspecified lump in unspecified breast: Secondary | ICD-10-CM | POA: Insufficient documentation

## 2021-10-25 DIAGNOSIS — C50512 Malignant neoplasm of lower-outer quadrant of left female breast: Secondary | ICD-10-CM | POA: Insufficient documentation

## 2021-10-25 DIAGNOSIS — Z483 Aftercare following surgery for neoplasm: Secondary | ICD-10-CM

## 2021-10-25 DIAGNOSIS — Z171 Estrogen receptor negative status [ER-]: Secondary | ICD-10-CM | POA: Insufficient documentation

## 2021-10-25 DIAGNOSIS — I890 Lymphedema, not elsewhere classified: Secondary | ICD-10-CM | POA: Insufficient documentation

## 2021-10-25 NOTE — Therapy (Addendum)
[facility] ?[place] @ [facility] ?[place], [place], [address] ?Phone: [phone]   Fax:  [phone] ? ?Physical Therapy Treatment ? ?Patient Details  ?Name: Kristina Harvey ?MRN: [id number] ?Date of Birth: Jan 25, 1966 ?Referring Provider (PT): Dr. Ursula Alert ? ? ?Encounter Date: 10/25/2021 ? ? PT End of Session - 10/25/21 0911   ? ? Visit Number 5   # unchanged due to screen only  ? PT Start Time [phone]   ? PT Stop Time [phone]   ? PT Time Calculation (min) 13 min   ? Activity Tolerance Patient tolerated treatment well   ? Behavior During Therapy [facility] for tasks assessed/performed   ? ?  ?  ? ?  ? ? ?Past Medical History:  ?Diagnosis Date  ? Arrhythmia   ? paroxysmal atrial fibrillation  ? Cancer [facility])   ? Family history of breast cancer   ? Family history of prostate cancer   ? Family history of stomach cancer   ? History of radiation therapy   ? left breast and subclavian  05/18/2021-07/01/2021  Dr Gery Pray  ? Hyperlipidemia   ? Hypothyroidism   ? Thyroid disease   ? ? ?Past Surgical History:  ?Procedure Laterality Date  ? BREAST LUMPECTOMY WITH RADIOACTIVE SEED AND SENTINEL LYMPH NODE BIOPSY Left 04/16/2021  ? Procedure: LEFT BREAST LUMPECTOMY WITH RADIOACTIVE SEED AND SENTINEL LYMPH NODE BIOPSY;  Surgeon: Jovita Kussmaul, MD;  Location: [facility];  Service: General;  Laterality: Left;  ? chin implant  1996  ? IR IMAGING GUIDED PORT INSERTION  11/24/2020  ? RADIOACTIVE SEED GUIDED AXILLARY SENTINEL LYMPH NODE Left 04/16/2021  ? Procedure: RADIOACTIVE SEED GUIDED AXILLARY SENTINEL LYMPH NODE DISSECTION;  Surgeon: Jovita Kussmaul, MD;  Location: [place] [facility];  Service: General;  Laterality: Left;  ? ? ?Vitals:  ? 10/25/21 0910  ?Weight: 163 lb 6 oz (74.1 kg)  ? ? ? Subjective Assessment - 10/25/21 0910   ? ? Subjective Pt returns for her 3 month L-Dex screen.   ? Pertinent History Patient was diagnosed on 10/14/2020 with left grade III invasive ductal carcinoma. She  underwent neoadjuvant  chemotherapy from 11/27/220-03/12/2021 followed by a left lumpectomy and sentinel node biopsy (3 negative nodes) on 04/16/2021. It is ER/PR negative and HER2 positive with a Ki67 of 20%.   ? ?  ?  ? ?  ? ? ? ? ? ? ? ? ? L-DEX FLOWSHEETS - 10/25/21 0900   ? ?  ? L-DEX LYMPHEDEMA SCREENING  ? Measurement Type Unilateral   ? L-DEX MEASUREMENT EXTREMITY Upper Extremity   ? POSITION  Standing   ? DOMINANT SIDE Right   ? At Risk Side Left   ? BASELINE SCORE (UNILATERAL) -7.3   ? L-DEX SCORE (UNILATERAL) -0.6   ? VALUE CHANGE (UNILAT) 6.7   ? ?  ?  ? ?  ? ? ? ? ? ? ? ? ? ? ? ? ? ? ? ? ? ? ? ? ? ? ? PT Short Term Goals - 10/07/21 0924   ? ?  ? PT SHORT TERM GOAL #1  ? Title independent with hip stretches, diaphragmatic breathing   ? Time 4   ? Period Weeks   ? Status Achieved   ?  ? PT SHORT TERM GOAL #2  ? Title understand vaginal moisturizers and how they help the vaginal dryness and pain   ? Time 4   ? Period Weeks   ?  Status Achieved   ?  ? PT SHORT TERM GOAL #3  ? Title understand ways to manage pain with meditation   ? Time 4   ? Period Weeks   ? Status Achieved   ?  ? PT SHORT TERM GOAL #4  ? Title able to have the q-tip test without pain due to decreased sensitivity   ? Time 4   ? Period Weeks   ? Status On-going   ? ?  ?  ? ?  ? ? ? ? PT Long Term Goals - 09/16/21 1251   ? ?  ? PT LONG TERM GOAL #2  ? Title independent with HEP for relaxation of the vaginal tissue so she is able to have vaginal penetration   ? Time 12   ? Period Weeks   ? Status New   ? Target Date 12/09/21   ?  ? PT LONG TERM GOAL #3  ? Title able to have penile penetration at [facility] score 1/3 due to improvement of the vaginal dryness and tissue irritation   ? Time 12   ? Period Weeks   ? Status New   ? Target Date 12/09/21   ?  ? PT LONG TERM GOAL #4  ? Title understand lubricants that do not irritate the vaginal tissue to use with vaginal penetration   ? Time 12   ? Period Weeks   ? Status New   ? Target Date 12/09/21   ? ?  ?  ? ?   ? ? ? ? ? ? ? ? Plan - 10/25/21 0925   ? ? Clinical Impression Statement Pt returns for her 3 month L-Dex screen. Her change from baseline of 6.7 is higher than normal limits and indicates subclinical lymphedema. She was measured for a compression sleeve and issued a size II sand color. Instructed to wear this daily for next 30 days, 10hrs/day and not at night, then to return for SOZO screen at that time. Also instructed pt in donning and care of sleeve with washing instructions included. Pt able to verbalize good understanding. Pt also reports noticing increased palpable fullness in Lt breast that has progressively gotten worse since ending radiation and also notices pulling of skin over incision indicating increased edema. Since patient reported this change in status to PTA, this initiated the PTA consulting with Shan Levans, PT. PT determined it would be appropriate to initiate therapy at this time. PT requested a referral from patient's provider. ?  ? PT Next Visit Plan Return for next SOZO screen in 1 month after wearing her compression sleeve daily to reassess change from baseline. If she returns to [facility] then resume every 3 month L-Dex screens for up to 2 years from her [facility] (~04/17/2023)   ? Consulted and Agree with Plan of Care Patient   ? ?  ?  ? ?  ? ? ?Patient will benefit from skilled therapeutic intervention in order to improve the following deficits and impairments:    ? ?Visit Diagnosis: ?Aftercare following surgery for neoplasm ? ? ? ? ?Problem List ?Patient Active Problem List  ? Diagnosis Date Noted  ? Port-A-Cath in place 01/07/2021  ? Family history of breast cancer   ? Family history of prostate cancer   ? Family history of stomach cancer   ? Malignant neoplasm of lower-outer quadrant of left breast of female, estrogen receptor negative ([place]) 11/09/2020  ? Atrial fibrillation ([place]) 10/01/2019  ? Disorder of thyroid gland 10/01/2019  ?  Genetic testing 09/22/2017  ? ? ?Otelia Limes,  PTA ?10/25/2021, 12:24 PM ? ?[place] ?[facility] [place] ?[place], [place], [address] ?Phone: [phone]   Fax:  [phone] ? ?Name: RAVAN SCHLEMMER ?MRN: [id number] ?Date of Birth: [birth year] ? ? ? ?

## 2021-10-26 ENCOUNTER — Inpatient Hospital Stay: Payer: Managed Care, Other (non HMO)

## 2021-10-26 ENCOUNTER — Inpatient Hospital Stay: Payer: Managed Care, Other (non HMO) | Attending: Hematology and Oncology

## 2021-10-26 ENCOUNTER — Other Ambulatory Visit: Payer: Self-pay | Admitting: *Deleted

## 2021-10-26 VITALS — BP 129/75 | HR 75 | Temp 97.9°F | Resp 20 | Wt 168.8 lb

## 2021-10-26 DIAGNOSIS — Z95828 Presence of other vascular implants and grafts: Secondary | ICD-10-CM

## 2021-10-26 DIAGNOSIS — Z5112 Encounter for antineoplastic immunotherapy: Secondary | ICD-10-CM | POA: Diagnosis present

## 2021-10-26 DIAGNOSIS — C50512 Malignant neoplasm of lower-outer quadrant of left female breast: Secondary | ICD-10-CM

## 2021-10-26 DIAGNOSIS — Z171 Estrogen receptor negative status [ER-]: Secondary | ICD-10-CM | POA: Diagnosis not present

## 2021-10-26 DIAGNOSIS — Z5111 Encounter for antineoplastic chemotherapy: Secondary | ICD-10-CM | POA: Insufficient documentation

## 2021-10-26 DIAGNOSIS — N63 Unspecified lump in unspecified breast: Secondary | ICD-10-CM

## 2021-10-26 LAB — CBC WITH DIFFERENTIAL (CANCER CENTER ONLY)
Abs Immature Granulocytes: 0.01 10*3/uL (ref 0.00–0.07)
Basophils Absolute: 0 10*3/uL (ref 0.0–0.1)
Basophils Relative: 1 %
Eosinophils Absolute: 0 10*3/uL (ref 0.0–0.5)
Eosinophils Relative: 1 %
HCT: 35.9 % — ABNORMAL LOW (ref 36.0–46.0)
Hemoglobin: 12.5 g/dL (ref 12.0–15.0)
Immature Granulocytes: 0 %
Lymphocytes Relative: 18 %
Lymphs Abs: 1 10*3/uL (ref 0.7–4.0)
MCH: 34.2 pg — ABNORMAL HIGH (ref 26.0–34.0)
MCHC: 34.8 g/dL (ref 30.0–36.0)
MCV: 98.1 fL (ref 80.0–100.0)
Monocytes Absolute: 0.5 10*3/uL (ref 0.1–1.0)
Monocytes Relative: 10 %
Neutro Abs: 3.8 10*3/uL (ref 1.7–7.7)
Neutrophils Relative %: 70 %
Platelet Count: 207 10*3/uL (ref 150–400)
RBC: 3.66 MIL/uL — ABNORMAL LOW (ref 3.87–5.11)
RDW: 11.6 % (ref 11.5–15.5)
WBC Count: 5.4 10*3/uL (ref 4.0–10.5)
nRBC: 0 % (ref 0.0–0.2)

## 2021-10-26 LAB — CMP (CANCER CENTER ONLY)
ALT: 19 U/L (ref 0–44)
AST: 19 U/L (ref 15–41)
Albumin: 3.7 g/dL (ref 3.5–5.0)
Alkaline Phosphatase: 53 U/L (ref 38–126)
Anion gap: 6 (ref 5–15)
BUN: 10 mg/dL (ref 6–20)
CO2: 26 mmol/L (ref 22–32)
Calcium: 8.8 mg/dL — ABNORMAL LOW (ref 8.9–10.3)
Chloride: 106 mmol/L (ref 98–111)
Creatinine: 0.63 mg/dL (ref 0.44–1.00)
GFR, Estimated: >60 mL/min (ref >60)
Glucose, Bld: 92 mg/dL (ref 70–99)
Potassium: 3.9 mmol/L (ref 3.5–5.1)
Sodium: 138 mmol/L (ref 135–145)
Total Bilirubin: 0.4 mg/dL (ref 0.3–1.2)
Total Protein: 6.3 g/dL — ABNORMAL LOW (ref 6.5–8.1)

## 2021-10-26 MED ORDER — HEPARIN SOD (PORK) LOCK FLUSH 100 UNIT/ML IV SOLN
500.0000 [IU] | Freq: Once | INTRAVENOUS | Status: AC | PRN
  Administered 2021-10-26: 500 [IU]

## 2021-10-26 MED ORDER — TRASTUZUMAB-ANNS CHEMO 150 MG IV SOLR
6.0000 mg/kg | Freq: Once | INTRAVENOUS | Status: AC
  Administered 2021-10-26: 441 mg via INTRAVENOUS
  Filled 2021-10-26: qty 21

## 2021-10-26 MED ORDER — SODIUM CHLORIDE 0.9% FLUSH
10.0000 mL | INTRAVENOUS | Status: DC | PRN
  Administered 2021-10-26: 10 mL

## 2021-10-26 MED ORDER — SODIUM CHLORIDE 0.9 % IV SOLN
420.0000 mg | Freq: Once | INTRAVENOUS | Status: AC
  Administered 2021-10-26: 420 mg via INTRAVENOUS
  Filled 2021-10-26: qty 14

## 2021-10-26 MED ORDER — ACETAMINOPHEN 325 MG PO TABS
650.0000 mg | ORAL_TABLET | Freq: Once | ORAL | Status: AC
  Administered 2021-10-26: 650 mg via ORAL
  Filled 2021-10-26: qty 2

## 2021-10-26 MED ORDER — SODIUM CHLORIDE 0.9% FLUSH
10.0000 mL | Freq: Once | INTRAVENOUS | Status: AC
  Administered 2021-10-26: 10 mL

## 2021-10-26 MED ORDER — DIPHENHYDRAMINE HCL 25 MG PO CAPS
25.0000 mg | ORAL_CAPSULE | Freq: Once | ORAL | Status: AC
  Administered 2021-10-26: 25 mg via ORAL
  Filled 2021-10-26: qty 1

## 2021-10-26 MED ORDER — SODIUM CHLORIDE 0.9 % IV SOLN: Freq: Once | INTRAVENOUS | Status: AC

## 2021-10-26 NOTE — Progress Notes (Signed)
Pt declined 30 minute observation period post Pertuzumab. VSS. No complaints at time of discharge.  ?

## 2021-10-26 NOTE — Progress Notes (Signed)
error 

## 2021-10-26 NOTE — Patient Instructions (Signed)
[facility] MEDICAL [facility]COLOGY  Discharge Instructions: Thank you for choosing [facility] to provide your oncology and hematology care.   If you have a lab appointment with the Cancer Center, please go directly to the Cancer Center and check in at the registration area.   Wear comfortable clothing and clothing appropriate for easy access to any Portacath or PICC line.   We strive to give you quality time with your provider. You may need to reschedule your appointment if you arrive late (15 or more minutes).  Arriving late affects you and other patients whose appointments are after yours.  Also, if you miss three or more appointments without notifying the office, you may be dismissed from the clinic at the provider's discretion.      For prescription refill requests, have your pharmacy contact our office and allow 72 hours for refills to be completed.    Today you received the following chemotherapy and/or immunotherapy agents: Trastuzumab, Pertuzumab.      To help prevent nausea and vomiting after your treatment, we encourage you to take your nausea medication as directed.  BELOW ARE SYMPTOMS THAT SHOULD BE REPORTED IMMEDIATELY: *FEVER GREATER THAN 100.4 F (38 C) OR HIGHER *CHILLS OR SWEATING *NAUSEA AND VOMITING THAT IS NOT CONTROLLED WITH YOUR NAUSEA MEDICATION *UNUSUAL SHORTNESS OF BREATH *UNUSUAL BRUISING OR BLEEDING *URINARY PROBLEMS (pain or burning when urinating, or frequent urination) *BOWEL PROBLEMS (unusual diarrhea, constipation, pain near the anus) TENDERNESS IN MOUTH AND THROAT WITH OR WITHOUT PRESENCE OF ULCERS (sore throat, sores in mouth, or a toothache) UNUSUAL RASH, SWELLING OR PAIN  UNUSUAL VAGINAL DISCHARGE OR ITCHING   Items with * indicate a potential emergency and should be followed up as soon as possible or go to the Emergency Department if any problems should occur.  Please show the CHEMOTHERAPY ALERT CARD or IMMUNOTHERAPY ALERT CARD  at check-in to the Emergency Department and triage nurse.  Should you have questions after your visit or need to cancel or reschedule your appointment, please contact [facility]  Dept: [phone]  and follow the prompts.  Office hours are 8:00 a.m. to 4:30 p.m. Monday - Friday. Please note that voicemails left after 4:00 p.m. may not be returned until the following business day.  We are closed weekends and major holidays. You have access to a nurse at all times for urgent questions. Please call the main number to the clinic Dept: [phone] and follow the prompts.   For any non-urgent questions, you may also contact your provider using MyChart. We now offer e-Visits for anyone 18 and older to request care online for non-urgent symptoms. For details visit [email or web address].   Also download the MyChart app! Go to the app store, search "MyChart", open the app, select , and log in with your MyChart username and password.  Due to Covid, a mask is required upon entering the hospital/clinic. If you do not have a mask, one will be given to you upon arrival. For doctor visits, patients may have 1 support person aged 18 or older with them. For treatment visits, patients cannot have anyone with them due to current Covid guidelines and our immunocompromised population.  

## 2021-10-26 NOTE — Therapy (Signed)
?OUTPATIENT PHYSICAL THERAPY ONCOLOGY EVALUATION ? ?Patient Name: Kristina Harvey ?MRN: [id number] ?DOB:[birth year], 56 y.o., female ?Today's Date: 10/27/2021 ? ? PT End of Session - 10/27/21 0859   ? ? Visit Number 6   ? Number of Visits 10   ? Date for PT Re-Evaluation 12/08/21   ? PT Start Time 0800   ? PT Stop Time 0160   ? PT Time Calculation (min) 49 min   ? Activity Tolerance Patient tolerated treatment well   ? Behavior During Therapy [facility] for tasks assessed/performed   ? ?  ?  ? ?  ? ? ?Past Medical History:  ?Diagnosis Date  ? Arrhythmia   ? paroxysmal atrial fibrillation  ? Cancer [facility])   ? Family history of breast cancer   ? Family history of prostate cancer   ? Family history of stomach cancer   ? History of radiation therapy   ? left breast and subclavian  05/18/2021-07/01/2021  Dr Gery Pray  ? Hyperlipidemia   ? Hypothyroidism   ? Thyroid disease   ? ?Past Surgical History:  ?Procedure Laterality Date  ? BREAST LUMPECTOMY WITH RADIOACTIVE SEED AND SENTINEL LYMPH NODE BIOPSY Left 04/16/2021  ? Procedure: LEFT BREAST LUMPECTOMY WITH RADIOACTIVE SEED AND SENTINEL LYMPH NODE BIOPSY;  Surgeon: Jovita Kussmaul, MD;  Location: [place];  Service: General;  Laterality: Left;  ? chin implant  1996  ? IR IMAGING GUIDED PORT INSERTION  11/24/2020  ? RADIOACTIVE SEED GUIDED AXILLARY SENTINEL LYMPH NODE Left 04/16/2021  ? Procedure: RADIOACTIVE SEED GUIDED AXILLARY SENTINEL LYMPH NODE DISSECTION;  Surgeon: Jovita Kussmaul, MD;  Location: [place];  Service: General;  Laterality: Left;  ? ?Patient Active Problem List  ? Diagnosis Date Noted  ? Port-A-Cath in place 01/07/2021  ? Family history of breast cancer   ? Family history of prostate cancer   ? Family history of stomach cancer   ? Malignant neoplasm of lower-outer quadrant of left breast of female, estrogen receptor negative ([place]) 11/09/2020  ? Atrial fibrillation ([place]) 10/01/2019  ? Disorder of thyroid gland 10/01/2019  ? Genetic testing 09/22/2017  ? ? ?PCP: Alroy Dust,  L.Marlou Sa, MD ? ?REFERRING PROVIDER: Jovita Kussmaul, MD ? ?REFERRING DIAG: left breast lymphedema ? ?THERAPY DIAG:  ?Aftercare following surgery for neoplasm - Plan: PT plan of care cert/re-cert ? ?Malignant neoplasm of lower-outer quadrant of left breast of female, estrogen receptor negative ([place]) - Plan: PT plan of care cert/re-cert ? ?Lymphedema, not elsewhere classified - Plan: PT plan of care cert/re-cert ? ?ONSET DATE: since surgery 04/16/21 ? ?SUBJECTIVE                                                                                                                                                                                          ? ?  SUBJECTIVE STATEMENT: it is just swelling and not going away ? ?PERTINENT HISTORY:  ?Patient was diagnosed on 10/14/2020 with left grade III invasive ductal carcinoma. She  underwent neoadjuvant chemotherapy from 11/27/220-03/12/2021 followed by a left lumpectomy and sentinel node biopsy (3 negative nodes) on 04/16/2021. Completed radiation.  ? ?PAIN:  ?Are you having pain? No ?Just sore to the touch - the whole breast  ? ?PRECAUTIONS: Other: lymphedema risk Lt UE  ? ?WEIGHT BEARING RESTRICTIONS No ? ?FALLS:  ?Has patient fallen in last 6 months? No ? ?LIVING ENVIRONMENT: ?Lives with: lives with their family and lives with their spouse ?Lives in: House/apartment ? ?OCCUPATION: works full time as IT trainer ? ?LEISURE: walks 3x/week for 23mn ? ?HAND DOMINANCE : right  ? ?PRIOR LEVEL OF FUNCTION: Independent ? ?PATIENT GOALS what to do for the swelling  ? ? ?OBJECTIVE ? ?COGNITION: ? Overall cognitive status: Within functional limits for tasks assessed  ? ?PALPATION: Assessment in supine with pt permission: Increased scar tissue denisty lumpectomy incision inferior lateral breast, mild fibrosis inf breast ? ?OBSERVATIONS / OTHER ASSESSMENTS:  ?Breast edema questionnaire:  >9 indicative of breast lymphedema:   score of 28/80 ? ? ?POSTURE: rounded shoulders, forward head ? ? ?TODAY'S  TREATMENT  ?With pt permission for breast MLD ?Education on lymphatic anatomy and drainage patterns of the breast and principles of MLD ?Performed each step in supine per instruction section below with PT reading and then performing each step and then with hand over hand cueing and performance by pt with modification as needed - noted below.  ?Made pt foam chip pack for use in fibrotic breast locations with instruction on using 336m-1 hour to start and then as needed ?Gave pt new prescription for compression bra and encouraged use as much as possible as the most important component of decreasing breast edema.   ? ?PATIENT EDUCATION:  ?Education details: see treatment section ?Person educated: Patient ?Education method: Explanation, Demonstration, Tactile cues, Verbal cues, and Handouts ?Education comprehension: verbalized understanding, returned demonstration, verbal cues required, tactile cues required, and needs further education ? ? ?HOME EXERCISE PROGRAM: ?Self MLD lt breast per below ? ?ASSESSMENT: ? ?CLINICAL IMPRESSION: ?Patient is a [age].o. female who was seen today for physical therapy evaluation and treatment for continued left breast edema, axillary congestion, and increasing SOZO score.  Pt has overall larger Lt breast with soft non pitting edema.  Mild fibrosis inferior breast and tenderness near incision.  Pt lives farther away and is agreeable to 1x per week for 4 weeks to make sure she is ind with self MLD and use of compression for management.    ? ? ?OBJECTIVE IMPAIRMENTS decreased knowledge of use of DME. , edema ? ?ACTIVITY LIMITATIONS none.  ? ?PERSONAL FACTORS 1-2 comorbidities: SLNB, radiation,   are also affecting patient's functional outcome.  ? ? ?REHAB POTENTIAL: Excellent ? ?CLINICAL DECISION MAKING: Stable/uncomplicated ? ?EVALUATION COMPLEXITY: Low ? ?GOALS: ?Goals reviewed with patient? Yes ? ?SHORT TERM GOALS: ? ?Pt will be educated on breast MLD ?Baseline:  ?Target date:  11/10/2021 ?Goal status: IN PROGRESS ? ? ?LONG TERM GOALS: ? ?Pt will be ind with self MLD for the Lt breast ?Baseline:  ?Target date: 11/24/2021 ?Goal status: INITIAL ? ?2.  Pt will report decreased breast edema questionnaire score of 15 or less to demonstrate improved edema ?Baseline: 28 ?Target date: 11/24/2021 ?Goal status: INITIAL ? ? ?PLAN: ?PT FREQUENCY: 1x/week ? ?PT DURATION: 6 weeks ? ?PLANNED INTERVENTIONS: Therapeutic exercises, Patient/Family education,  scar mobilization, Taping, and Manual therapy ? ?PLAN FOR NEXT SESSION: how was breast MLD/review this in seated, new bra? New foam?  Continue MLD and axillary decongestion.  May want to include Lt upper arm or arm as her SOZO score is elevated.  Repeat SOZO on 11/22/21 or later visit to assess sleeve use ? ? ?Stark Bray, PT ?10/27/2021, 9:00 AM ? ? ?10/27/21 ? ?Manual Lymph Drainage for Left Breast. ?  ?Do daily.  Do slowly. ?Use flat hands with just enough pressure to stretch the skin. ?Do not slide over the skin, stretch the skin with the hand. (Stretch ? Relax ? Move) ?Lie down or sit comfortably (in a recliner, for example) to do this. ?  ?Do circles at each collar bone near neck 5-7 times (to ?wake up? lots of lymph nodes in this area). ? ?Take slow deep breaths, allowing your belly to balloon out as you breathe in, 5x (to ?wake up? abdominal lymph nodes). ? ?Both armpits--stretch skin in small circles to stimulate intact lymph nodes there, 5-7x. ? ?Left groin area, at panty line--stretch skin in small circles to stimulate lymph nodes 5-7x. ? ?Redirect fluid from left chest toward right armpit (stretch skin starting at left chest in 3-4 spots working toward right armpit) 3-4x across the chest. ? ?Redirect fluid from left armpit toward left groin (cup your hand around the curve of your left side and do 3-4 ?pumps? from armpit to groin) 3-4x down your side. ? ?Draw an imaginary diagonal line from upper outer breast through the nipple area toward lower  inner breast.  Direct fluid upward and inward from this line toward the pathway across your upper chest (established in #5).  Do this in three rows to treat all the upper inner breast and do each row 3-4x. ? ?Then dire

## 2021-10-27 ENCOUNTER — Encounter: Payer: Self-pay | Admitting: Rehabilitation

## 2021-10-27 ENCOUNTER — Other Ambulatory Visit: Payer: Self-pay

## 2021-10-27 ENCOUNTER — Ambulatory Visit: Payer: Managed Care, Other (non HMO) | Admitting: Rehabilitation

## 2021-10-27 DIAGNOSIS — N63 Unspecified lump in unspecified breast: Secondary | ICD-10-CM | POA: Diagnosis present

## 2021-10-27 DIAGNOSIS — C50512 Malignant neoplasm of lower-outer quadrant of left female breast: Secondary | ICD-10-CM | POA: Diagnosis present

## 2021-10-27 DIAGNOSIS — Z171 Estrogen receptor negative status [ER-]: Secondary | ICD-10-CM

## 2021-10-27 DIAGNOSIS — I890 Lymphedema, not elsewhere classified: Secondary | ICD-10-CM | POA: Diagnosis present

## 2021-10-27 DIAGNOSIS — Z483 Aftercare following surgery for neoplasm: Secondary | ICD-10-CM | POA: Diagnosis not present

## 2021-11-02 ENCOUNTER — Encounter: Payer: Managed Care, Other (non HMO) | Admitting: Physical Therapy

## 2021-11-03 ENCOUNTER — Ambulatory Visit
Admission: RE | Admit: 2021-11-03 | Discharge: 2021-11-03 | Disposition: A | Discharge status: Home / Self Care | Payer: Managed Care, Other (non HMO) | Source: Ambulatory Visit | Attending: Hematology and Oncology | Admitting: Hematology and Oncology

## 2021-11-04 ENCOUNTER — Ambulatory Visit: Payer: Managed Care, Other (non HMO)

## 2021-11-04 ENCOUNTER — Other Ambulatory Visit: Payer: Self-pay

## 2021-11-04 DIAGNOSIS — Z483 Aftercare following surgery for neoplasm: Secondary | ICD-10-CM

## 2021-11-04 DIAGNOSIS — I890 Lymphedema, not elsewhere classified: Secondary | ICD-10-CM

## 2021-11-04 DIAGNOSIS — C50512 Malignant neoplasm of lower-outer quadrant of left female breast: Secondary | ICD-10-CM

## 2021-11-04 NOTE — Therapy (Signed)
?OUTPATIENT PHYSICAL THERAPY TREATMENT NOTE ? ? ?Patient Name: Kristina Harvey ?MRN: [id number] ?DOB:[birth year], 56 y.o., female ?Today's Date: 11/04/2021 ? ?PCP: Alroy Dust, L.Marlou Sa, MD ?REFERRING PROVIDER: Jovita Kussmaul, MD ? ? PT End of Session - 11/04/21 4580   ? ? Visit Number 7  (#1 for lymphedema)  ? Number of Visits 10   ? Date for PT Re-Evaluation 12/08/21   ? Authorization Type [employer]   ? PT Start Time [phone]   ? PT Stop Time 0850   ? PT Time Calculation (min) 46 min   ? Activity Tolerance Patient tolerated treatment well   ? Behavior During Therapy [facility] for tasks assessed/performed   ? ?  ?  ? ?  ? ? ?Past Medical History:  ?Diagnosis Date  ? Arrhythmia   ? paroxysmal atrial fibrillation  ? Cancer [facility])   ? Family history of breast cancer   ? Family history of prostate cancer   ? Family history of stomach cancer   ? History of radiation therapy   ? left breast and subclavian  05/18/2021-07/01/2021  Dr Gery Pray  ? Hyperlipidemia   ? Hypothyroidism   ? Thyroid disease   ? ?Past Surgical History:  ?Procedure Laterality Date  ? BREAST LUMPECTOMY    ? BREAST LUMPECTOMY WITH RADIOACTIVE SEED AND SENTINEL LYMPH NODE BIOPSY Left 04/16/2021  ? Procedure: LEFT BREAST LUMPECTOMY WITH RADIOACTIVE SEED AND SENTINEL LYMPH NODE BIOPSY;  Surgeon: Jovita Kussmaul, MD;  Location: [place];  Service: General;  Laterality: Left;  ? chin implant  1996  ? IR IMAGING GUIDED PORT INSERTION  11/24/2020  ? RADIOACTIVE SEED GUIDED AXILLARY SENTINEL LYMPH NODE Left 04/16/2021  ? Procedure: RADIOACTIVE SEED GUIDED AXILLARY SENTINEL LYMPH NODE DISSECTION;  Surgeon: Jovita Kussmaul, MD;  Location: [place];  Service: General;  Laterality: Left;  ? ?Patient Active Problem List  ? Diagnosis Date Noted  ? Port-A-Cath in place 01/07/2021  ? Family history of breast cancer   ? Family history of prostate cancer   ? Family history of stomach cancer   ? Malignant neoplasm of lower-outer quadrant of left breast of female, estrogen receptor negative ([place])  11/09/2020  ? Atrial fibrillation ([place]) 10/01/2019  ? Disorder of thyroid gland 10/01/2019  ? Genetic testing 09/22/2017  ? ? ?REFERRING DIAG: Left breast Cancer ? ?THERAPY DIAG:  ?Aftercare following surgery for neoplasm ? ?Malignant neoplasm of lower-outer quadrant of left breast of female, estrogen receptor negative ([place]) ? ?Lymphedema, not elsewhere classified ? ?PERTINENT HISTORY: PERTINENT HISTORY:  ?Patient was diagnosed on 10/14/2020 with left grade III invasive ductal carcinoma. She  underwent neoadjuvant chemotherapy from 11/27/220-03/12/2021 followed by a left lumpectomy and sentinel node biopsy (3 negative nodes) on 04/16/2021. Completed radiation ? ?PRECAUTIONS: Other: lymphedema risk Lt UE  ? ?SUBJECTIVE: I have been wearing the sleeve with no problems. I am going to try and get the bra next week. I tried the MLD a little. I havent worn the foam packs much yet. I had a mammogram yesterday and I am sore. ? ?PAIN:  ?Are you having pain? No, tender and sore ? ? ? ?OBJECTIVE ?  ?COGNITION: ?           Overall cognitive status: Within functional limits for tasks assessed  ?  ?PALPATION:   Assessment in supine with pt permission: Increased scar tissue denisty lumpectomy incision inferior lateral breast, mild fibrosis inf breast ?  ?OBSERVATIONS / OTHER ASSESSMENTS:  ?Breast edema questionnaire:  >9 indicative of breast  lymphedema:   score of 28/80 ?  ?  ?POSTURE: rounded shoulders, forward head ?  ?  ?TODAY'S TREATMENT  ? ?11/04/2021 ? ?In reclined position on table continued education in  Left breast MLD with activation of short neck, 5 breaths, bilateral axillary LN's, left groin LN's, anterior interaxillary pathway, left axillo-inguinal pathway, and left breast directing to appropriate pathways and ending with LN's. Therapist also performed MLD to the left upper arm to help stimulate UE lymphatics.  Pt was shown proper way to don sleeve. ? ? ? ? ? ? ?10/28/2021 ?With pt permission for breast MLD ?Education on  lymphatic anatomy and drainage patterns of the breast and principles of MLD ?Performed each step in supine per instruction section below with PT reading and then performing each step and then with hand over hand cueing and performance by pt with modification as needed - noted below.  ?Made pt foam chip pack for use in fibrotic breast locations with instruction on using 46mn-1 hour to start and then as needed ?Gave pt new prescription for compression bra and encouraged use as much as possible as the most important component of decreasing breast edema.   ?  ?PATIENT EDUCATION:  ?Education details: see treatment section ?Person educated: Patient ?Education method: Explanation, Demonstration, Tactile cues, Verbal cues, and Handouts ?Education comprehension: verbalized understanding, returned demonstration, verbal cues required, tactile cues required, and needs further education ?  ?  ?HOME EXERCISE PROGRAM: ?Self MLD lt breast per below ?  ?ASSESSMENT: ?  ?CLINICAL IMPRESSION: ?Practiced all left breast techniques with pt. She demonstrated good understanding of sequence and used very good technique.  She was also shown proper way to wear sleeve and use of gloves. Advised to practice MLD at home. Minimal fibrosis noted today in breast. She will be fit for compression bra next week. ?  ?  ?OBJECTIVE IMPAIRMENTS decreased knowledge of use of DME. , edema ?  ?ACTIVITY LIMITATIONS none.  ?  ?PERSONAL FACTORS 1-2 comorbidities: SLNB, radiation,   are also affecting patient's functional outcome.  ?  ?  ?REHAB POTENTIAL: Excellent ?  ?CLINICAL DECISION MAKING: Stable/uncomplicated ?  ?EVALUATION COMPLEXITY: Low ?  ?GOALS: ?Goals reviewed with patient? Yes ?  ?SHORT TERM GOALS: ?  ?Pt will be educated on breast MLD ?Baseline:  ?Target date: 11/10/2021 ?Goal status: IN PROGRESS ?  ?  ?LONG TERM GOALS: ?  ?Pt will be ind with self MLD for the Lt breast ?Baseline:  ?Target date: 11/24/2021 ?Goal status: INITIAL ?  ?2.  Pt will  report decreased breast edema questionnaire score of 15 or less to demonstrate improved edema ?Baseline: 28 ?Target date: 11/24/2021 ?Goal status: INITIAL ?  ?  ?PLAN: ?PT FREQUENCY: 1x/week ?  ?PT DURATION: 6 weeks ?  ?PLANNED INTERVENTIONS: Therapeutic exercises, Patient/Family education, scar mobilization, Taping, and Manual therapy ?  ?PLAN FOR NEXT SESSION: how was breast MLD/review this in seated, new bra? New foam?  Continue MLD and axillary decongestion.  May want to include Lt upper arm or arm as her SOZO score is elevated.  Repeat SOZO on 11/22/21 or later visit to assess sleeve use ?  ?  ? ? ? ? ?RClaris Pong PT ?11/04/2021, 8:54 AM ? ?  ? ?

## 2021-11-08 ENCOUNTER — Other Ambulatory Visit: Payer: Self-pay | Admitting: Gastroenterology

## 2021-11-08 DIAGNOSIS — Z800 Family history of malignant neoplasm of digestive organs: Secondary | ICD-10-CM

## 2021-11-10 ENCOUNTER — Ambulatory Visit: Payer: Managed Care, Other (non HMO)

## 2021-11-10 ENCOUNTER — Other Ambulatory Visit: Payer: Self-pay

## 2021-11-10 DIAGNOSIS — Z171 Estrogen receptor negative status [ER-]: Secondary | ICD-10-CM

## 2021-11-10 DIAGNOSIS — Z483 Aftercare following surgery for neoplasm: Secondary | ICD-10-CM | POA: Diagnosis not present

## 2021-11-10 DIAGNOSIS — I890 Lymphedema, not elsewhere classified: Secondary | ICD-10-CM

## 2021-11-10 NOTE — Therapy (Signed)
?OUTPATIENT PHYSICAL THERAPY TREATMENT NOTE ? ? ?Patient Name: Kristina Harvey ?MRN: [id number] ?DOB:24-Oct-1965, 56 y.o., female ?Today's Date: 11/10/2021 ? ?PCP: Alroy Dust, L.Marlou Sa, MD ?REFERRING PROVIDER: Jovita Kussmaul, MD ? ?PT End of Session - 11/10/21 0859   ?  ?  Visit Number 8 (3 for lymphedema)  ?  Number of Visits 10   ?  Date for PT Re-Evaluation 12/08/21   ?  PT Start Time [phone]  ?  PT Stop Time 0859  ?  PT Time Calculation (min) 56 min   ?  Activity Tolerance Patient tolerated treatment well   ?  Behavior During Therapy [facility] for tasks assessed/performed  ? ? ? ?Past Medical History:  ?Diagnosis Date  ? Arrhythmia   ? paroxysmal atrial fibrillation  ? Cancer [facility])   ? Family history of breast cancer   ? Family history of prostate cancer   ? Family history of stomach cancer   ? History of radiation therapy   ? left breast and subclavian  05/18/2021-07/01/2021  Dr Gery Pray  ? Hyperlipidemia   ? Hypothyroidism   ? Thyroid disease   ? ?Past Surgical History:  ?Procedure Laterality Date  ? BREAST LUMPECTOMY    ? BREAST LUMPECTOMY WITH RADIOACTIVE SEED AND SENTINEL LYMPH NODE BIOPSY Left 04/16/2021  ? Procedure: LEFT BREAST LUMPECTOMY WITH RADIOACTIVE SEED AND SENTINEL LYMPH NODE BIOPSY;  Surgeon: Jovita Kussmaul, MD;  Location: [place];  Service: General;  Laterality: Left;  ? chin implant  1996  ? IR IMAGING GUIDED PORT INSERTION  11/24/2020  ? RADIOACTIVE SEED GUIDED AXILLARY SENTINEL LYMPH NODE Left 04/16/2021  ? Procedure: RADIOACTIVE SEED GUIDED AXILLARY SENTINEL LYMPH NODE DISSECTION;  Surgeon: Jovita Kussmaul, MD;  Location: [place];  Service: General;  Laterality: Left;  ? ?Patient Active Problem List  ? Diagnosis Date Noted  ? Port-A-Cath in place 01/07/2021  ? Family history of breast cancer   ? Family history of prostate cancer   ? Family history of stomach cancer   ? Malignant neoplasm of lower-outer quadrant of left breast of female, estrogen receptor negative ([place]) 11/09/2020  ? Atrial fibrillation ([place])  10/01/2019  ? Disorder of thyroid gland 10/01/2019  ? Genetic testing 09/22/2017  ? ? ?REFERRING DIAG: Left breast Cancer ? ?THERAPY DIAG:  ?Aftercare following surgery for neoplasm ? ?Malignant neoplasm of lower-outer quadrant of left breast of female, estrogen receptor negative ([place]) ? ?Lymphedema, not elsewhere classified ? ?PERTINENT HISTORY: PERTINENT HISTORY:  ?Patient was diagnosed on 10/14/2020 with left grade III invasive ductal carcinoma. She  underwent neoadjuvant chemotherapy from 11/27/220-03/12/2021 followed by a left lumpectomy and sentinel node biopsy (3 negative nodes) on 04/16/2021. Completed radiation ? ?PRECAUTIONS: Other: lymphedema risk Lt UE  ? ?SUBJECTIVE: I haven't [facility] an [facility] yet [facility] [place] but I have been wearing a sports bra in the mean time. I think I'm going to just ask them to reorder what I got last time bc they fit well, I've just worn them so much the hooks have bent.  ? ?PAIN:  ?Are you having pain? No, just tender to touch ? ? ? ?OBJECTIVE ?  ?COGNITION: ?           Overall cognitive status: Within functional limits for tasks assessed  ?  ?PALPATION:   Assessment in supine with pt permission: Increased scar tissue denisty lumpectomy incision inferior lateral breast, mild fibrosis inf breast ?  ?OBSERVATIONS / OTHER ASSESSMENTS:  ?Breast edema questionnaire:  >9 indicative of  breast lymphedema:   score of 28/80 ?  ?  ?POSTURE: rounded shoulders, forward head ?  ?  ?TODAY'S TREATMENT  ? ?11/10/21: ?In Supine: Left breast MLD with activation of short neck, 5 breaths, Rt axillary LN's, left groin LN's, anterior interaxillary pathway, left axillo-inguinal pathway, and left breast directing to appropriate pathways and ending with LN's, also briefly showed pt Rt S/L  for better aspect to lateral breast and axillo-inguinal anastomosis, then  finished retracing all steps in supine focusing on medial aspect of breast. Therapist also performed MLD to the left upper arm to help stimulate  UE lymphatics and reviewed breast sequence with pt while performing.  ? ?11/04/2021 ? ?In reclined position on table continued education in  Left breast MLD with activation of short neck, 5 breaths, bilateral axillary LN's, left groin LN's, anterior interaxillary pathway, left axillo-inguinal pathway, and left breast directing to appropriate pathways and ending with LN's. Therapist also performed MLD to the left upper arm to help stimulate UE lymphatics.  Pt was shown proper way to don sleeve. ? ?10/28/2021 ?With pt permission for breast MLD ?Education on lymphatic anatomy and drainage patterns of the breast and principles of MLD ?Performed each step in supine per instruction section below with PT reading and then performing each step and then with hand over hand cueing and performance by pt with modification as needed - noted below.  ?Made pt foam chip pack for use in fibrotic breast locations with instruction on using 3mn-1 hour to start and then as needed ?Gave pt new prescription for compression bra and encouraged use as much as possible as the most important component of decreasing breast edema.   ?  ?PATIENT EDUCATION:  ?Education details: see treatment section ?Person educated: Patient ?Education method: Explanation, Demonstration, Tactile cues, Verbal cues, and Handouts ?Education comprehension: verbalized understanding, returned demonstration, verbal cues required, tactile cues required, and needs further education ?  ?  ?HOME EXERCISE PROGRAM: ?Self MLD lt breast per below ?  ?ASSESSMENT: ?  ?CLINICAL IMPRESSION: ?Pt reports missed a few days of self MLD but has been doing this at home. She noted increased firmness at inner medial breast so focused most time here today and skin wasn't as taut by end of session. Also issued 1/4" gray compression foam for pt to try wearing here. She is able to verbalize good understanding of self MLD technique at this time. She hasn't made an app[facility] [place] for  compression bra but has been wearing her sports bras in mean time an plans to call them to see if they can just reorder compression bras she has had in past as they fit well.  ?  ?  ?OBJECTIVE IMPAIRMENTS decreased knowledge of use of DME. , edema ?  ?ACTIVITY LIMITATIONS none.  ?  ?PERSONAL FACTORS 1-2 comorbidities: SLNB, radiation,   are also affecting patient's functional outcome.  ?  ?  ?REHAB POTENTIAL: Excellent ?  ?CLINICAL DECISION MAKING: Stable/uncomplicated ?  ?EVALUATION COMPLEXITY: Low ?  ?GOALS: ?Goals reviewed with patient? Yes ?  ?SHORT TERM GOALS: ?  ?Pt will be educated on breast MLD ?Baseline:  ?Target date: 11/10/2021 ?Goal status: IN PROGRESS ?  ?  ?LONG TERM GOALS: ?  ?Pt will be ind with self MLD for the Lt breast ?Baseline:  ?Target date: 11/24/2021 ?Goal status: INITIAL ?  ?2.  Pt will report decreased breast edema questionnaire score of 15 or less to demonstrate improved edema ?Baseline: 28 ?Target date: 11/24/2021 ?Goal  status: INITIAL ?  ?  ?PLAN: ?PT FREQUENCY: 1x/week ?  ?PT DURATION: 6 weeks ?  ?PLANNED INTERVENTIONS: Therapeutic exercises, Patient/Family education, scar mobilization, Taping, and Manual therapy ?  ?PLAN FOR NEXT SESSION: how was breast MLD/review this in seated? new bra? How was new foam?  Continue MLD and axillary decongestion.  May cont Lt upper arm or arm as her SOZO score is elevated.  Repeat SOZO on 11/22/21 or later visit to assess sleeve use ?  ?  ? ? ? ? ?Otelia Limes, PTA ?11/10/2021, 9:07 AM ? ?  ? ?

## 2021-11-11 ENCOUNTER — Ambulatory Visit
Admission: RE | Admit: 2021-11-11 | Discharge: 2021-11-11 | Disposition: A | Discharge status: Home / Self Care | Payer: Managed Care, Other (non HMO) | Source: Ambulatory Visit | Attending: Gastroenterology | Admitting: Gastroenterology

## 2021-11-11 DIAGNOSIS — Z800 Family history of malignant neoplasm of digestive organs: Secondary | ICD-10-CM

## 2021-11-15 NOTE — Progress Notes (Signed)
? ?Patient Care Team: ?Alroy Dust, L.Marlou Sa, MD as PCP - General (Family Medicine) ?Jovita Kussmaul, MD as Consulting Physician (General Surgery) ?Nicholas Lose, MD as Consulting Physician (Hematology and Oncology) ?Gery Pray, MD as Consulting Physician (Radiation Oncology) ?Mauro Kaufmann, RN as Oncology Nurse Navigator ?Rockwell Germany, RN as Oncology Nurse Navigator ? ?DIAGNOSIS:  ?Encounter Diagnosis  ?Name Primary?  ? Malignant neoplasm of lower-outer quadrant of left breast of female, estrogen receptor negative ([place])   ? ? ?SUMMARY OF ONCOLOGIC HISTORY: ?Oncology History  ?Malignant neoplasm of lower-outer quadrant of left breast of female, estrogen receptor negative ([place])  ?11/04/2020 Initial Diagnosis  ? Screening mammogram showed a possible left breast mass and enlarged axillary lymph nodes. Diagnostic mammogram and US showed a 1.4cm mass at the 3:30 position in the left breast and 2 abnormal lymph nodes in the left axilla. 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 ?  ?11/11/2020 Cancer Staging  ? Staging form: Breast, AJCC 8th Edition ?- Clinical stage from 11/11/2020: Stage IIA (cT1c, cN1, cM0, G3, ER-, PR-, HER2+) - Signed by Nicholas Lose, MD on 11/11/2020 ?Stage prefix: Initial diagnosis ?Histologic grading system: 3 grade system ?Laterality: Left ?Staged by: Pathologist and managing physician ?Stage used in treatment planning: Yes ?National guidelines used in treatment planning: Yes ?Type of national guideline used in treatment planning: NCCN ? ?  ?11/27/2020 - 04/02/2021 Chemotherapy  ? TCHP x6 cycles ? ?  ? ?  ?04/16/2021 Surgery  ? Left lumpectomy 04/16/2021: Pathologic complete response, 0/3 lymph nodes negative ?  ?05/03/2021 -  Chemotherapy  ? Patient is on Treatment Plan : BREAST Trastuzumab + Pertuzumab q21d  ?   ?05/20/2021 - 07/01/2021 Radiation Therapy  ? Adjuvant XRT ?  ? ?CHIEF COMPLIANT: Herceptin Perjeta  maintenance ? ? ?INTERVAL HISTORY: Kristina Harvey is a  56 y.o. with above-mentioned history of breast cancer having completed neoadjuvant chemotherapy with [place] and radiation, currently on maintenance with Herceptin Perjeta. She presents to the clinic today for treatment. She denies diarrhea. She states treatment went fine.  ? ? ?ALLERGIES:  is allergic to caffeine. ? ?MEDICATIONS:  ?Current Outpatient Medications  ?Medication Sig Dispense Refill  ? acetaminophen (TYLENOL) 500 MG tablet Take 1,000 mg by mouth every 6 (six) hours as needed for moderate pain.    ? ALPRAZolam (XANAX) 0.25 MG tablet Take 0.25 mg by mouth at bedtime as needed for sleep.    ? colestipol (COLESTID) 1 g tablet Take 2 g by mouth at bedtime.    ? fluorouracil (EFUDEX) 5 % cream Apply 1 application topically daily. (Patient not taking: Reported on 07/20/2021)    ? levothyroxine (SYNTHROID) 75 MCG tablet Take 75 mcg by mouth daily before breakfast.    ? metoprolol succinate (TOPROL-XL) 25 MG 24 hr tablet Take 1 tablet (25 mg total) by mouth daily. 90 tablet 3  ? metoprolol tartrate (LOPRESSOR) 25 MG tablet Take 1 tablet (25 mg total) by mouth 2 (two) times daily as needed (palpitations). 60 tablet 3  ? Multiple Vitamin (MULTIVITAMIN WITH MINERALS) TABS tablet Take 1 tablet by mouth daily.    ? Polyethyl Glycol-Propyl Glycol (SYSTANE OP) Place 1 drop into both eyes 2 (two) times daily.    ? Probiotic Product (PROBIOTIC PO) Take 1 capsule by mouth daily.    ? Psyllium (METAMUCIL PO) Take 1 Scoop by mouth daily. (Patient not taking: Reported on 09/09/2021)    ? ?  No current facility-administered medications for this visit.  ? ? ?PHYSICAL EXAMINATION: ?ECOG PERFORMANCE STATUS: 1 - Symptomatic but completely ambulatory ? ?Vitals:  ? 11/16/21 1053  ?BP: (!) 143/71  ?Pulse: 86  ?Resp: 17  ?Temp: (!) 97.2 ?F (36.2 ?C)  ?SpO2: 100%  ? ?Filed Weights  ? 11/16/21 1053  ?Weight: 165 lb 1.6 oz (74.9 kg)  ? ?  ? ?LABORATORY DATA:  ?I have reviewed the  data as listed ? ?  Latest Ref Rng & Units 10/26/2021  ?  9:59 AM 10/04/2021  ? 10:04 AM 09/13/2021  ?  8:19 AM  ?CMP  ?Glucose 70 - 99 mg/dL 92   101   163    ?BUN 6 - 20 mg/dL 10   16   13     ?Creatinine 0.44 - 1.00 mg/dL 0.63   0.61   0.69    ?Sodium 135 - 145 mmol/L 138   138   138    ?Potassium 3.5 - 5.1 mmol/L 3.9   4.2   4.2    ?Chloride 98 - 111 mmol/L 106   106   104    ?CO2 22 - 32 mmol/L 26   29   26     ?Calcium 8.9 - 10.3 mg/dL 8.8   9.0   9.0    ?Total Protein 6.5 - 8.1 g/dL 6.3   7.1   7.2    ?Total Bilirubin 0.3 - 1.2 mg/dL 0.4   0.3   0.5    ?Alkaline Phos 38 - 126 U/L 53   57   58    ?AST 15 - 41 U/L 19   19   23     ?ALT 0 - 44 U/L 19   17   16     ? ? ?Lab Results  ?Component Value Date  ? WBC 4.9 11/16/2021  ? HGB 12.4 11/16/2021  ? HCT 37.6 11/16/2021  ? MCV 100.8 (H) 11/16/2021  ? PLT 205 11/16/2021  ? NEUTROABS 3.3 11/16/2021  ? ? ?ASSESSMENT & PLAN:  ?Malignant neoplasm of lower-outer quadrant of left breast of female, estrogen receptor negative ([facility]) ?11/04/2020:Screening mammogram showed a possible left breast mass and enlarged axillary lymph nodes. Diagnostic mammogram and US showed a 1.4cm mass at the 3:30 position in the left breast and 2 abnormal lymph nodes in the left axilla. 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 ?  ?Treatment Plan: ?1. Neoadjuvant chemotherapy with Ullin Perjeta ?6 cycles completed 03/12/2021 followed by Herceptin Perjeta maintenance versus Kadcyla maintenance (based on response to neoadjuvant chemo) for 1 year ?2. Left lumpectomy 04/16/2021: Pathologic complete response, 0/3 lymph nodes negative ?3. Followed by adjuvant radiation therapy 05/19/2021-07/01/2021 ?4.  Consideration for neratinib ?------------------------------------------------------------------------------------------------------------------------------- ?Current Treatment: Completed 6 cycles of (11/27/20-03/12/2021),  currently on Herceptin Perjeta maintenance ?Herceptin Perjeta toxicities: Diarrhea has improved significantly ?  ?We discussed the role of neratinib briefly.   After much discussion she decided that she does not want to take it. ?  ?Breast Cancer Surveillance: ?Mammogram 11/03/2021: Benign breast density category C ?Echocardiogram 09/10/2021: Patient is experiencing tachycardia of unknown reason: LVEF 55 to 60% ?  ?  ?Peripheral neuropathy: 4/10: I recommended participation in the phase 2 ACCRU [place] 2102 study of N-Palmitoylrthanolamide vs placebo for the treatment of chemo induced peripheral neuropathy.  She decided not to participate in the study ?She finishes anti-HER2 therapy on 12/06/2021.   ? ?I have sent a  message to Dr. Marlou Starks for port removal. ? ?We will see her back in 6 months for routine follow-up.  After that we can see her once a year. ? ? ? ?No orders of the defined types were placed in this encounter. ? ?The patient has a good understanding of the overall plan. she agrees with it. she will call with any problems that may develop before the next visit here. ?Total time spent: 30 mins including face to face time and time spent for planning, charting and co-ordination of care ? ? Harriette Ohara, MD ?11/16/21 ? ? ? I [name] Coins am scribing for Dr. Lindi Adie ? ?I have reviewed the above documentation for accuracy and completeness, and I agree with the above. ?. ?

## 2021-11-16 ENCOUNTER — Other Ambulatory Visit: Payer: Self-pay | Admitting: *Deleted

## 2021-11-16 ENCOUNTER — Inpatient Hospital Stay: Payer: Managed Care, Other (non HMO)

## 2021-11-16 ENCOUNTER — Inpatient Hospital Stay: Payer: Managed Care, Other (non HMO) | Attending: Hematology and Oncology

## 2021-11-16 ENCOUNTER — Other Ambulatory Visit: Payer: Self-pay

## 2021-11-16 ENCOUNTER — Inpatient Hospital Stay (HOSPITAL_BASED_OUTPATIENT_CLINIC_OR_DEPARTMENT_OTHER): Payer: Managed Care, Other (non HMO) | Admitting: Hematology and Oncology

## 2021-11-16 DIAGNOSIS — Z5112 Encounter for antineoplastic immunotherapy: Secondary | ICD-10-CM | POA: Insufficient documentation

## 2021-11-16 DIAGNOSIS — Z5111 Encounter for antineoplastic chemotherapy: Secondary | ICD-10-CM | POA: Insufficient documentation

## 2021-11-16 DIAGNOSIS — Z95828 Presence of other vascular implants and grafts: Secondary | ICD-10-CM

## 2021-11-16 DIAGNOSIS — Z171 Estrogen receptor negative status [ER-]: Secondary | ICD-10-CM

## 2021-11-16 DIAGNOSIS — C50512 Malignant neoplasm of lower-outer quadrant of left female breast: Secondary | ICD-10-CM | POA: Diagnosis present

## 2021-11-16 LAB — CBC WITH DIFFERENTIAL (CANCER CENTER ONLY)
Abs Immature Granulocytes: 0.02 10*3/uL (ref 0.00–0.07)
Basophils Absolute: 0 10*3/uL (ref 0.0–0.1)
Basophils Relative: 0 %
Eosinophils Absolute: 0.1 10*3/uL (ref 0.0–0.5)
Eosinophils Relative: 1 %
HCT: 37.6 % (ref 36.0–46.0)
Hemoglobin: 12.4 g/dL (ref 12.0–15.0)
Immature Granulocytes: 0 %
Lymphocytes Relative: 19 %
Lymphs Abs: 0.9 10*3/uL (ref 0.7–4.0)
MCH: 33.2 pg (ref 26.0–34.0)
MCHC: 33 g/dL (ref 30.0–36.0)
MCV: 100.8 fL — ABNORMAL HIGH (ref 80.0–100.0)
Monocytes Absolute: 0.6 10*3/uL (ref 0.1–1.0)
Monocytes Relative: 12 %
Neutro Abs: 3.3 10*3/uL (ref 1.7–7.7)
Neutrophils Relative %: 68 %
Platelet Count: 205 10*3/uL (ref 150–400)
RBC: 3.73 MIL/uL — ABNORMAL LOW (ref 3.87–5.11)
RDW: 11.8 % (ref 11.5–15.5)
WBC Count: 4.9 10*3/uL (ref 4.0–10.5)
nRBC: 0 % (ref 0.0–0.2)

## 2021-11-16 LAB — CMP (CANCER CENTER ONLY)
ALT: 22 U/L (ref 0–44)
AST: 22 U/L (ref 15–41)
Albumin: 4 g/dL (ref 3.5–5.0)
Alkaline Phosphatase: 52 U/L (ref 38–126)
Anion gap: 4 — ABNORMAL LOW (ref 5–15)
BUN: 15 mg/dL (ref 6–20)
CO2: 28 mmol/L (ref 22–32)
Calcium: 9.5 mg/dL (ref 8.9–10.3)
Chloride: 104 mmol/L (ref 98–111)
Creatinine: 0.56 mg/dL (ref 0.44–1.00)
GFR, Estimated: >60 mL/min (ref >60)
Glucose, Bld: 106 mg/dL — ABNORMAL HIGH (ref 70–99)
Potassium: 4.4 mmol/L (ref 3.5–5.1)
Sodium: 136 mmol/L (ref 135–145)
Total Bilirubin: 0.5 mg/dL (ref 0.3–1.2)
Total Protein: 7.3 g/dL (ref 6.5–8.1)

## 2021-11-16 MED ORDER — SODIUM CHLORIDE 0.9% FLUSH
10.0000 mL | Freq: Once | INTRAVENOUS | Status: AC
  Administered 2021-11-16: 10 mL

## 2021-11-16 MED ORDER — SODIUM CHLORIDE 0.9 % IV SOLN
420.0000 mg | Freq: Once | INTRAVENOUS | Status: AC
  Administered 2021-11-16: 420 mg via INTRAVENOUS
  Filled 2021-11-16: qty 14

## 2021-11-16 MED ORDER — SODIUM CHLORIDE 0.9 % IV SOLN: Freq: Once | INTRAVENOUS | Status: AC

## 2021-11-16 MED ORDER — ACETAMINOPHEN 325 MG PO TABS
650.0000 mg | ORAL_TABLET | Freq: Once | ORAL | Status: AC
  Administered 2021-11-16: 650 mg via ORAL
  Filled 2021-11-16: qty 2

## 2021-11-16 MED ORDER — TRASTUZUMAB-ANNS CHEMO 150 MG IV SOLR
6.0000 mg/kg | Freq: Once | INTRAVENOUS | Status: AC
  Administered 2021-11-16: 441 mg via INTRAVENOUS
  Filled 2021-11-16: qty 21

## 2021-11-16 MED ORDER — HEPARIN SOD (PORK) LOCK FLUSH 100 UNIT/ML IV SOLN
500.0000 [IU] | Freq: Once | INTRAVENOUS | Status: AC | PRN
  Administered 2021-11-16: 500 [IU]

## 2021-11-16 MED ORDER — SODIUM CHLORIDE 0.9% FLUSH
10.0000 mL | INTRAVENOUS | Status: DC | PRN
  Administered 2021-11-16: 10 mL

## 2021-11-16 MED ORDER — DIPHENHYDRAMINE HCL 25 MG PO CAPS
25.0000 mg | ORAL_CAPSULE | Freq: Once | ORAL | Status: AC
  Administered 2021-11-16: 25 mg via ORAL
  Filled 2021-11-16: qty 1

## 2021-11-16 NOTE — Patient Instructions (Signed)
[facility]  Discharge Instructions: ?Thank you for choosing [facility] to provide your oncology and hematology care.  ? ?If you have a lab appointment with the [facility], please go directly to the [facility] and check in at the registration area. ?  ?Wear comfortable clothing and clothing appropriate for easy access to any Portacath or PICC line.  ? ?We strive to give you quality time with your provider. You may need to reschedule your appointment if you arrive late (15 or more minutes).  Arriving late affects you and other patients whose appointments are after yours.  Also, if you miss three or more appointments without notifying the office, you may be dismissed from the clinic at the provider?s discretion.    ?  ?For prescription refill requests, have your pharmacy contact our office and allow 72 hours for refills to be completed.   ? ?Today you received the following chemotherapy and/or immunotherapy agents herceptin, perjeta    ?  ?To help prevent nausea and vomiting after your treatment, we encourage you to take your nausea medication as directed. ? ?BELOW ARE SYMPTOMS THAT SHOULD BE REPORTED IMMEDIATELY: ?*FEVER GREATER THAN 100.4 F (38 ?C) OR HIGHER ?*CHILLS OR SWEATING ?*NAUSEA AND VOMITING THAT IS NOT CONTROLLED WITH YOUR NAUSEA MEDICATION ?*UNUSUAL SHORTNESS OF BREATH ?*UNUSUAL BRUISING OR BLEEDING ?*URINARY PROBLEMS (pain or burning when urinating, or frequent urination) ?*BOWEL PROBLEMS (unusual diarrhea, constipation, pain near the anus) ?TENDERNESS IN MOUTH AND THROAT WITH OR WITHOUT PRESENCE OF ULCERS (sore throat, sores in mouth, or a toothache) ?UNUSUAL RASH, SWELLING OR PAIN  ?UNUSUAL VAGINAL DISCHARGE OR ITCHING  ? ?Items with * indicate a potential emergency and should be followed up as soon as possible or go to the Emergency Department if any problems should occur. ? ?Please show the CHEMOTHERAPY ALERT CARD or IMMUNOTHERAPY ALERT CARD at  check-in to the Emergency Department and triage nurse. ? ?Should you have questions after your visit or need to cancel or reschedule your appointment, please contact [facility]  Dept: [phone]  and follow the prompts.  Office hours are 8:00 a.m. to 4:30 p.m. Monday - Friday. Please note that voicemails left after 4:00 p.m. may not be returned until the following business day.  We are closed weekends and major holidays. You have access to a nurse at all times for urgent questions. Please call the main number to the clinic Dept: [phone] and follow the prompts. ? ? ?For any non-urgent questions, you may also contact your provider using MyChart. We now offer e-Visits for anyone 8 and older to request care online for non-urgent symptoms. For details visit [email or web address]. ?  ?Also download the MyChart app! Go to the app store, search "MyChart", open the app, select [facility], and log in with your MyChart username and password. ? ?Due to Covid, a mask is required upon entering the hospital/clinic. If you do not have a mask, one will be given to you upon arrival. For doctor visits, patients may have 1 support person aged 50 or older with them. For treatment visits, patients cannot have anyone with them due to current Covid guidelines and our immunocompromised population.  ? ?

## 2021-11-16 NOTE — Progress Notes (Signed)
Verbal orders received by MD for pt to have port a cath removed in IR. Orders placed with expected dated 12/16/2021. ?

## 2021-11-16 NOTE — Assessment & Plan Note (Signed)
11/04/2020:Screening mammogram showed a possible left breast mass and enlarged axillary lymph nodes. Diagnostic mammogram and US showed a 1.4cm mass at the 3:30 position in the left breast and 2 abnormal lymph nodes in the left axilla. 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 ?? ?Treatment Plan: ?1.?Neoadjuvant chemotherapy with [place] Perjeta ?6 cycles?completed 03/12/2021?followed by Herceptin Perjeta maintenance versus Kadcyla maintenance (based on response to neoadjuvant chemo) for 1 year ?2. Left lumpectomy 04/16/2021: Pathologic complete response, 0/3 lymph nodes negative ?3. Followed by adjuvant radiation therapy?05/19/2021-07/01/2021 ?4.??Consideration for neratinib ?------------------------------------------------------------------------------------------------------------------------------- ?Current Treatment:?Completed 6 cycles of?(11/27/20-03/12/2021), currently on Herceptin Perjeta maintenance ?Herceptin Perjeta toxicities:?Diarrhea has resolved ?? ?Return to clinic?for?Herceptin Perjeta maintenance ?We discussed the role of neratinib briefly.???After much discussion?she decided that she does not want to take it. ?? ?Breast Cancer Surveillance: ?Mammogram 11/03/2021: Benign breast density category C ?Echocardiogram 09/10/2021: Patient is experiencing tachycardia of unknown reason: LVEF 55 to 60% ?Breast exam 11/16/2021: Benign ?? ?Peripheral neuropathy: 4/10: I recommended participation in the phase 2 ACCRU [place] 2102 study of N-Palmitoylrthanolamide vs placebo for the treatment of chemo induced peripheral neuropathy. ?? ?Every 3 weeks for Herceptin Perjeta every 6 weeks to follow-up with me. ?

## 2021-11-17 ENCOUNTER — Other Ambulatory Visit: Payer: Self-pay | Admitting: *Deleted

## 2021-11-17 DIAGNOSIS — C50512 Malignant neoplasm of lower-outer quadrant of left female breast: Secondary | ICD-10-CM

## 2021-11-24 ENCOUNTER — Ambulatory Visit: Payer: Managed Care, Other (non HMO) | Attending: Hematology and Oncology

## 2021-11-24 VITALS — Wt 168.1 lb

## 2021-11-24 DIAGNOSIS — I890 Lymphedema, not elsewhere classified: Secondary | ICD-10-CM | POA: Diagnosis present

## 2021-11-24 DIAGNOSIS — Z171 Estrogen receptor negative status [ER-]: Secondary | ICD-10-CM | POA: Diagnosis present

## 2021-11-24 DIAGNOSIS — Z483 Aftercare following surgery for neoplasm: Secondary | ICD-10-CM | POA: Diagnosis present

## 2021-11-24 DIAGNOSIS — C50512 Malignant neoplasm of lower-outer quadrant of left female breast: Secondary | ICD-10-CM | POA: Insufficient documentation

## 2021-11-24 NOTE — Therapy (Signed)
?OUTPATIENT PHYSICAL THERAPY TREATMENT NOTE ? ? ?Patient Name: Kristina Harvey ?MRN: [id number] ?DOB:1965-10-07, 56 y.o., female ?Today's Date: 11/24/2021 ? ?PCP: Alroy Dust, L.Marlou Sa, MD ?REFERRING PROVIDER: Nicholas Lose, MD ? ?PT End of Session - 11/10/21 0859   ?  ?  Visit Number 9 (3 for lymphedema)  ?  Number of Visits 10   ?  Date for PT Re-Evaluation 12/08/21   ?  PT Start Time 0801  ?  PT Stop Time [phone]  ?  PT Time Calculation (min) 50 min   ?  Activity Tolerance Patient tolerated treatment well   ?  Behavior During Therapy [facility] for tasks assessed/performed, Treatment limited secondary to medical complications (incr  LBP)  ? ? ? ?Past Medical History:  ?Diagnosis Date  ? Arrhythmia   ? paroxysmal atrial fibrillation  ? Cancer [facility])   ? Family history of breast cancer   ? Family history of prostate cancer   ? Family history of stomach cancer   ? History of radiation therapy   ? left breast and subclavian  05/18/2021-07/01/2021  Dr Gery Pray  ? Hyperlipidemia   ? Hypothyroidism   ? Thyroid disease   ? ?Past Surgical History:  ?Procedure Laterality Date  ? BREAST LUMPECTOMY    ? BREAST LUMPECTOMY WITH RADIOACTIVE SEED AND SENTINEL LYMPH NODE BIOPSY Left 04/16/2021  ? Procedure: LEFT BREAST LUMPECTOMY WITH RADIOACTIVE SEED AND SENTINEL LYMPH NODE BIOPSY;  Surgeon: Jovita Kussmaul, MD;  Location: [facility];  Service: General;  Laterality: Left;  ? chin implant  1996  ? IR IMAGING GUIDED PORT INSERTION  11/24/2020  ? RADIOACTIVE SEED GUIDED AXILLARY SENTINEL LYMPH NODE Left 04/16/2021  ? Procedure: RADIOACTIVE SEED GUIDED AXILLARY SENTINEL LYMPH NODE DISSECTION;  Surgeon: Jovita Kussmaul, MD;  Location: [facility];  Service: General;  Laterality: Left;  ? ?Patient Active Problem List  ? Diagnosis Date Noted  ? Port-A-Cath in place 01/07/2021  ? Family history of breast cancer   ? Family history of prostate cancer   ? Family history of stomach cancer   ? Malignant neoplasm of lower-outer quadrant of left breast of female, estrogen  receptor negative ([place]) 11/09/2020  ? Atrial fibrillation ([place]) 10/01/2019  ? Disorder of thyroid gland 10/01/2019  ? Genetic testing 09/22/2017  ? ? ?REFERRING DIAG: Left breast Cancer ? ?THERAPY DIAG:  ?Aftercare following surgery for neoplasm ? ?Malignant neoplasm of lower-outer quadrant of left breast of female, estrogen receptor negative ([place]) ? ?Lymphedema, not elsewhere classified ? ?PERTINENT HISTORY: PERTINENT HISTORY:  ?Patient was diagnosed on 10/14/2020 with left grade III invasive ductal carcinoma. She  underwent neoadjuvant chemotherapy from 11/27/220-03/12/2021 followed by a left lumpectomy and sentinel node biopsy (3 negative nodes) on 04/16/2021. Completed radiation ? ?PRECAUTIONS: Other: lymphedema risk Lt UE  ? ?SUBJECTIVE: I found my other compression bra when we went to the beach this weekend so I've been wearing those. My back is really flared up today. It happens about 3x/wk from chronic issues like degenerative discs.  ? ?PAIN:  ?Are you having pain? Yes ?NPRS scale: 6/10 ?Pain location: low back ?Pain orientation: Lower  ?PAIN TYPE: grabbing ?Pain description: intermittent  ?Aggravating factors: just wake up like this sometimes  ?Relieving factors: once I walk around for a bit it always eases off ? ? ? ?OBJECTIVE ?  ?COGNITION: ?           Overall cognitive status: Within functional limits for tasks assessed  ?  ?PALPATION:   Assessment in supine with  pt permission: Increased scar tissue denisty lumpectomy incision inferior lateral breast, mild fibrosis inf breast ?  ?OBSERVATIONS / OTHER ASSESSMENTS:  ?Breast edema questionnaire:  >9 indicative of breast lymphedema:   score of 28/80 ?  ?  ?POSTURE: rounded shoulders, forward head ?  ?  ?TODAY'S TREATMENT  ? ?11/24/21: ?MLD: In Supine: Left breast MLD with activation of short neck, 5 breaths, Rt axillary LN's, left groin LN's, anterior interaxillary pathway, left axillo-inguinal pathway, and left breast directing to appropriate pathways and ending  with LN's, also briefly showed pt Rt S/L  for better aspect to lateral breast and axillo-inguinal anastomosis, then  finished retracing all steps in supine focusing on medial aspect of breast. Therapist also performed MLD to the left upper arm to help stimulate UE lymphatics and reviewed breast sequence with pt while performing.  ?P/ROM: In Supine to Lt shoulder into flexion, abd and D2 to pts tolerance ? ?11/10/21: ?In Supine: Left breast MLD with activation of short neck, 5 breaths, Rt axillary LN's, left groin LN's, anterior interaxillary pathway, left axillo-inguinal pathway, and left breast directing to appropriate pathways and ending with LN's, also briefly showed pt Rt S/L  for better aspect to lateral breast and axillo-inguinal anastomosis, then  finished retracing all steps in supine focusing on medial aspect of breast. Therapist also performed MLD to the left upper arm to help stimulate UE lymphatics and reviewed breast sequence with pt while performing.  ? ?11/04/2021 ? ?In reclined position on table continued education in  Left breast MLD with activation of short neck, 5 breaths, bilateral axillary LN's, left groin LN's, anterior interaxillary pathway, left axillo-inguinal pathway, and left breast directing to appropriate pathways and ending with LN's. Therapist also performed MLD to the left upper arm to help stimulate UE lymphatics.  Pt was shown proper way to don sleeve. ? ?10/28/2021 ?With pt permission for breast MLD ?Education on lymphatic anatomy and drainage patterns of the breast and principles of MLD ?Performed each step in supine per instruction section below with PT reading and then performing each step and then with hand over hand cueing and performance by pt with modification as needed - noted below.  ?Made pt foam chip pack for use in fibrotic breast locations with instruction on using 89mn-1 hour to start and then as needed ?Gave pt new prescription for compression bra and encouraged use as  much as possible as the most important component of decreasing breast edema.   ?  ?PATIENT EDUCATION:  ?Education details: see treatment section ?Person educated: Patient ?Education method: Explanation, Demonstration, Tactile cues, Verbal cues, and Handouts ?Education comprehension: verbalized understanding, returned demonstration, verbal cues required, tactile cues required, and needs further education ?  ?  ?HOME EXERCISE PROGRAM: ?Self MLD lt breast per below ?  ?ASSESSMENT: ?  ?CLINICAL IMPRESSION: ?Pt has been more consistent with her self MLD and reports improvement in her breast swelling with it not feeling as full. She is wearing her compression bra throughout the day and reports this has been beneficial as well. Overall pt is progressing well. Was going to incorporate ball roll up wall for end ROM stretching but pt would not be able to tolerate due to increased LBP today. Redid her SOZO after having an increased change from baseline above normal limits x1 month ago. She has been wearing her compression sleeve daily since then and her new change from baseline is 5 which is WNLs. Pt was educated that she no longer has to wear her sleeve  every day but was encouraged to wear with repetitive activity like exercise.  ?  ?  ?OBJECTIVE IMPAIRMENTS decreased knowledge of use of DME. , edema ?  ?ACTIVITY LIMITATIONS none.  ?  ?PERSONAL FACTORS 1-2 comorbidities: SLNB, radiation,   are also affecting patient's functional outcome.  ?  ?  ?REHAB POTENTIAL: Excellent ?  ?CLINICAL DECISION MAKING: Stable/uncomplicated ?  ?EVALUATION COMPLEXITY: Low ?  ?GOALS: ?Goals reviewed with patient? Yes ?  ?SHORT TERM GOALS: ?  ?Pt will be educated on breast MLD ?Baseline:  ?Target date: 11/10/2021 ?Goal status: IN PROGRESS ?  ?  ?LONG TERM GOALS: ?  ?Pt will be ind with self MLD for the Lt breast ?Baseline:  ?Target date: 11/24/2021 ?Goal status: INITIAL ?  ?2.  Pt will report decreased breast edema questionnaire score of 15 or less  to demonstrate improved edema ?Baseline: 28 ?Target date: 11/24/2021 ?Goal status: INITIAL ?  ?  ?PLAN: ?PT FREQUENCY: 1x/week ?  ?PT DURATION: 6 weeks ?  ?PLANNED INTERVENTIONS: Therapeutic exercises

## 2021-11-25 ENCOUNTER — Encounter: Payer: Managed Care, Other (non HMO) | Admitting: Physical Therapy

## 2021-12-01 ENCOUNTER — Encounter (HOSPITAL_COMMUNITY): Payer: Self-pay

## 2021-12-01 ENCOUNTER — Ambulatory Visit: Payer: Managed Care, Other (non HMO)

## 2021-12-01 DIAGNOSIS — Z483 Aftercare following surgery for neoplasm: Secondary | ICD-10-CM | POA: Diagnosis not present

## 2021-12-01 DIAGNOSIS — I890 Lymphedema, not elsewhere classified: Secondary | ICD-10-CM

## 2021-12-01 DIAGNOSIS — C50512 Malignant neoplasm of lower-outer quadrant of left female breast: Secondary | ICD-10-CM

## 2021-12-01 NOTE — Therapy (Addendum)
?OUTPATIENT PHYSICAL THERAPY TREATMENT NOTE ? ? ?Patient Name: Kristina Harvey ?MRN: [id number] ?DOB:07-05-1966, 56 y.o., female ?Today's Date: 12/01/2021 ? ?PCP: Alroy Dust, L.Marlou Sa, MD ?REFERRING PROVIDER: Autumn Messing MD ? ? ? ?PT End of Session - 11/10/21 0859   ?  ?  Visit Number 5 for cancer rehab  ?  Number of Visits 10   ?  Date for PT Re-Evaluation 12/08/21   ?  PT Start Time [phone]  ?  PT Stop Time 0859  ?  PT Time Calculation (min) 56 min   ?  Activity Tolerance Patient tolerated treatment well   ?  Behavior During Therapy [facility] for tasks assessed/performed  ? ? ? ?Past Medical History:  ?Diagnosis Date  ? Arrhythmia   ? paroxysmal atrial fibrillation  ? Cancer [facility])   ? Family history of breast cancer   ? Family history of prostate cancer   ? Family history of stomach cancer   ? History of radiation therapy   ? left breast and subclavian  05/18/2021-07/01/2021  Dr Gery Pray  ? Hyperlipidemia   ? Hypothyroidism   ? Thyroid disease   ? ?Past Surgical History:  ?Procedure Laterality Date  ? BREAST LUMPECTOMY    ? BREAST LUMPECTOMY WITH RADIOACTIVE SEED AND SENTINEL LYMPH NODE BIOPSY Left 04/16/2021  ? Procedure: LEFT BREAST LUMPECTOMY WITH RADIOACTIVE SEED AND SENTINEL LYMPH NODE BIOPSY;  Surgeon: Jovita Kussmaul, MD;  Location: [place];  Service: General;  Laterality: Left;  ? chin implant  1996  ? IR IMAGING GUIDED PORT INSERTION  11/24/2020  ? RADIOACTIVE SEED GUIDED AXILLARY SENTINEL LYMPH NODE Left 04/16/2021  ? Procedure: RADIOACTIVE SEED GUIDED AXILLARY SENTINEL LYMPH NODE DISSECTION;  Surgeon: Jovita Kussmaul, MD;  Location: [place];  Service: General;  Laterality: Left;  ? ?Patient Active Problem List  ? Diagnosis Date Noted  ? Port-A-Cath in place 01/07/2021  ? Family history of breast cancer   ? Family history of prostate cancer   ? Family history of stomach cancer   ? Malignant neoplasm of lower-outer quadrant of left breast of female, estrogen receptor negative ([place]) 11/09/2020  ? Atrial fibrillation ([place])  10/01/2019  ? Disorder of thyroid gland 10/01/2019  ? Genetic testing 09/22/2017  ? ? ?REFERRING DIAG: Left breast Cancer ? ?THERAPY DIAG:  ?Aftercare following surgery for neoplasm ? ?Malignant neoplasm of lower-outer quadrant of left breast of female, estrogen receptor negative ([place]) ? ?Lymphedema, not elsewhere classified ? ?PERTINENT HISTORY: PERTINENT HISTORY:  ?Patient was diagnosed on 10/14/2020 with left grade III invasive ductal carcinoma. She  underwent neoadjuvant chemotherapy from 11/27/220-03/12/2021 followed by a left lumpectomy and sentinel node biopsy (3 negative nodes) on 04/16/2021. Completed radiation ? ?PRECAUTIONS: Other: lymphedema risk Lt UE  ? ?SUBJECTIVE: Overall I think I'm doing really good. I'm ready to D/C today. ? ?PAIN:  ?Are you having pain? No ? ? ? ?OBJECTIVE ?  ?COGNITION: ?           Overall cognitive status: Within functional limits for tasks assessed  ?  ?PALPATION:   Assessment in supine with pt permission: Increased scar tissue denisty lumpectomy incision inferior lateral breast, mild fibrosis inf breast ?  ?OBSERVATIONS / OTHER ASSESSMENTS:  ?Breast edema questionnaire:  >9 indicative of breast lymphedema:   score of 28/80 ?  ?  ?POSTURE: rounded shoulders, forward head ?  ?  ?TODAY'S TREATMENT  ?12/01/21: ?MLD: In Supine: Left breast MLD with activation of short neck, 5 breaths, Rt axillary LN's, left groin LN's, anterior  interaxillary pathway, left axillo-inguinal pathway, and left breast directing to appropriate pathways and ending with LN's, also briefly showed pt Rt S/L  for better aspect to lateral breast and axillo-inguinal anastomosis, then  finished retracing all steps in supine focusing on medial aspect of breast.  ? ?11/24/21: ?MLD: In Supine: Left breast MLD with activation of short neck, 5 breaths, Rt axillary LN's, left groin LN's, anterior interaxillary pathway, left axillo-inguinal pathway, and left breast directing to appropriate pathways and ending with LN's, also  briefly showed pt Rt S/L  for better aspect to lateral breast and axillo-inguinal anastomosis, then  finished retracing all steps in supine focusing on medial aspect of breast. Therapist also performed MLD to the left upper arm to help stimulate UE lymphatics and reviewed breast sequence with pt while performing.  ?P/ROM: In Supine to Lt shoulder into flexion, abd and D2 to pts tolerance ? ?11/10/21: ?In Supine: Left breast MLD with activation of short neck, 5 breaths, Rt axillary LN's, left groin LN's, anterior interaxillary pathway, left axillo-inguinal pathway, and left breast directing to appropriate pathways and ending with LN's, also briefly showed pt Rt S/L  for better aspect to lateral breast and axillo-inguinal anastomosis, then  finished retracing all steps in supine focusing on medial aspect of breast. Therapist also performed MLD to the left upper arm to help stimulate UE lymphatics and reviewed breast sequence with pt while performing.  ? ?  ?PATIENT EDUCATION:  ?Education details: see treatment section ?Person educated: Patient ?Education method: Explanation, Demonstration, Tactile cues, Verbal cues, and Handouts ?Education comprehension: verbalized understanding, returned demonstration, verbal cues required, tactile cues required, and needs further education ?  ?  ?HOME EXERCISE PROGRAM: ?Self MLD lt breast per below ?  ?ASSESSMENT: ?  ?CLINICAL IMPRESSION: ?Pt has made excellent progress and met goal of being independent with self MLD. She demonstrated good progress towards other goal of reducing breast edema questionnaire from 28 to 26, partially meeting that goal. She is ready for D/C at this time but will cont every 3 month SOZO screens. Pt is agreeable to this.  ?  ?  ?OBJECTIVE IMPAIRMENTS decreased knowledge of use of DME. , edema ?  ?ACTIVITY LIMITATIONS none.  ?  ?PERSONAL FACTORS 1-2 comorbidities: SLNB, radiation,   are also affecting patient's functional outcome.  ?  ?  ?REHAB POTENTIAL:  Excellent ?  ?CLINICAL DECISION MAKING: Stable/uncomplicated ?  ?EVALUATION COMPLEXITY: Low ?  ?GOALS: ?Goals reviewed with patient? Yes ?  ?SHORT TERM GOALS: ?  ?Pt will be educated on breast MLD ?Baseline:  ?Target date: 11/10/2021 ?Goal status: Achieved ?  ?  ?LONG TERM GOALS: ?  ?Pt will be ind with self MLD for the Lt breast ?Baseline: Pt is now independent with this - 12/01/21 ?Target date: 11/24/2021 ?Goal status: Achieved ?  ?2.  Pt will report decreased breast edema questionnaire score of 15 or less to demonstrate improved edema ?Baseline: 28, 26 - 12/01/21 ?Target date: 11/24/2021 ?Goal status: Partially met ?  ?  ?PLAN: ?PT FREQUENCY: 1x/week ?  ?PT DURATION: 6 weeks ?  ?PLANNED INTERVENTIONS: Therapeutic exercises, Patient/Family education, scar mobilization, Taping, and Manual therapy ?  ?PLAN FOR NEXT SESSION: D/C this visit but will cont every 3 month L-Dex screens for up to 2 years from her SLNB (~04/17/2023)  ? ?PHYSICAL THERAPY DISCHARGE SUMMARY ? ?Visits from Start of Care: 5 ? ?Current functional level related to goals / functional outcomes: ?Achieved 2/3 goals and partially achieved 1 goal ?  ?Remaining  deficits: ?Mild breast edema ?  ?Education / Equipment: ?NA  ? ?Patient agrees to discharge. Patient goals were partially met. Patient is being discharged due to being pleased with the current functional level. ? ? ? ?Otelia Limes, PTA ?12/01/2021, 9:06 AM ? ?  ? ?

## 2021-12-03 ENCOUNTER — Encounter: Payer: Self-pay | Admitting: *Deleted

## 2021-12-03 DIAGNOSIS — C50512 Malignant neoplasm of lower-outer quadrant of left female breast: Secondary | ICD-10-CM

## 2021-12-07 ENCOUNTER — Other Ambulatory Visit: Payer: Self-pay

## 2021-12-07 ENCOUNTER — Inpatient Hospital Stay: Payer: Managed Care, Other (non HMO)

## 2021-12-07 VITALS — BP 135/75 | HR 78 | Temp 98.2°F | Resp 17 | Wt 166.0 lb

## 2021-12-07 DIAGNOSIS — Z5111 Encounter for antineoplastic chemotherapy: Secondary | ICD-10-CM | POA: Diagnosis not present

## 2021-12-07 DIAGNOSIS — Z171 Estrogen receptor negative status [ER-]: Secondary | ICD-10-CM

## 2021-12-07 DIAGNOSIS — Z95828 Presence of other vascular implants and grafts: Secondary | ICD-10-CM

## 2021-12-07 LAB — CBC WITH DIFFERENTIAL (CANCER CENTER ONLY)
Abs Immature Granulocytes: 0.01 10*3/uL (ref 0.00–0.07)
Basophils Absolute: 0 10*3/uL (ref 0.0–0.1)
Basophils Relative: 0 %
Eosinophils Absolute: 0.1 10*3/uL (ref 0.0–0.5)
Eosinophils Relative: 1 %
HCT: 36.5 % (ref 36.0–46.0)
Hemoglobin: 12.2 g/dL (ref 12.0–15.0)
Immature Granulocytes: 0 %
Lymphocytes Relative: 11 %
Lymphs Abs: 0.6 10*3/uL — ABNORMAL LOW (ref 0.7–4.0)
MCH: 33.7 pg (ref 26.0–34.0)
MCHC: 33.4 g/dL (ref 30.0–36.0)
MCV: 100.8 fL — ABNORMAL HIGH (ref 80.0–100.0)
Monocytes Absolute: 0.8 10*3/uL (ref 0.1–1.0)
Monocytes Relative: 16 %
Neutro Abs: 3.6 10*3/uL (ref 1.7–7.7)
Neutrophils Relative %: 72 %
Platelet Count: 206 10*3/uL (ref 150–400)
RBC: 3.62 MIL/uL — ABNORMAL LOW (ref 3.87–5.11)
RDW: 11.8 % (ref 11.5–15.5)
WBC Count: 5 10*3/uL (ref 4.0–10.5)
nRBC: 0 % (ref 0.0–0.2)

## 2021-12-07 LAB — CMP (CANCER CENTER ONLY)
ALT: 19 U/L (ref 0–44)
AST: 21 U/L (ref 15–41)
Albumin: 3.9 g/dL (ref 3.5–5.0)
Alkaline Phosphatase: 61 U/L (ref 38–126)
Anion gap: 3 — ABNORMAL LOW (ref 5–15)
BUN: 18 mg/dL (ref 6–20)
CO2: 26 mmol/L (ref 22–32)
Calcium: 9.1 mg/dL (ref 8.9–10.3)
Chloride: 107 mmol/L (ref 98–111)
Creatinine: 0.59 mg/dL (ref 0.44–1.00)
GFR, Estimated: >60 mL/min (ref >60)
Glucose, Bld: 105 mg/dL — ABNORMAL HIGH (ref 70–99)
Potassium: 4.2 mmol/L (ref 3.5–5.1)
Sodium: 136 mmol/L (ref 135–145)
Total Bilirubin: 0.3 mg/dL (ref 0.3–1.2)
Total Protein: 6.9 g/dL (ref 6.5–8.1)

## 2021-12-07 MED ORDER — ACETAMINOPHEN 325 MG PO TABS
650.0000 mg | ORAL_TABLET | Freq: Once | ORAL | Status: AC
  Administered 2021-12-07: 650 mg via ORAL
  Filled 2021-12-07: qty 2

## 2021-12-07 MED ORDER — SODIUM CHLORIDE 0.9% FLUSH
10.0000 mL | INTRAVENOUS | Status: DC | PRN
  Administered 2021-12-07: 10 mL

## 2021-12-07 MED ORDER — HEPARIN SOD (PORK) LOCK FLUSH 100 UNIT/ML IV SOLN
500.0000 [IU] | Freq: Once | INTRAVENOUS | Status: AC | PRN
  Administered 2021-12-07: 500 [IU]

## 2021-12-07 MED ORDER — SODIUM CHLORIDE 0.9% FLUSH
10.0000 mL | Freq: Once | INTRAVENOUS | Status: AC
  Administered 2021-12-07: 10 mL

## 2021-12-07 MED ORDER — TRASTUZUMAB-ANNS CHEMO 150 MG IV SOLR
6.0000 mg/kg | Freq: Once | INTRAVENOUS | Status: AC
  Administered 2021-12-07: 441 mg via INTRAVENOUS
  Filled 2021-12-07: qty 21

## 2021-12-07 MED ORDER — SODIUM CHLORIDE 0.9 % IV SOLN: Freq: Once | INTRAVENOUS | Status: AC

## 2021-12-07 MED ORDER — SODIUM CHLORIDE 0.9 % IV SOLN
420.0000 mg | Freq: Once | INTRAVENOUS | Status: AC
  Administered 2021-12-07: 420 mg via INTRAVENOUS
  Filled 2021-12-07: qty 14

## 2021-12-07 MED ORDER — DIPHENHYDRAMINE HCL 25 MG PO CAPS
25.0000 mg | ORAL_CAPSULE | Freq: Once | ORAL | Status: AC
  Administered 2021-12-07: 25 mg via ORAL
  Filled 2021-12-07: qty 1

## 2021-12-07 NOTE — Patient Instructions (Signed)
[facility]  Discharge Instructions: ?Thank you for choosing [facility] to provide your oncology and hematology care.  ? ?If you have a lab appointment with the [facility], please go directly to the [facility] and check in at the registration area. ?  ?Wear comfortable clothing and clothing appropriate for easy access to any Portacath or PICC line.  ? ?We strive to give you quality time with your provider. You may need to reschedule your appointment if you arrive late (15 or more minutes).  Arriving late affects you and other patients whose appointments are after yours.  Also, if you miss three or more appointments without notifying the office, you may be dismissed from the clinic at the provider?s discretion.    ?  ?For prescription refill requests, have your pharmacy contact our office and allow 72 hours for refills to be completed.   ? ?Today you received the following chemotherapy and/or immunotherapy agents: Trastuzumab and Pertuzumab    ?  ?To help prevent nausea and vomiting after your treatment, we encourage you to take your nausea medication as directed. ? ?BELOW ARE SYMPTOMS THAT SHOULD BE REPORTED IMMEDIATELY: ?*FEVER GREATER THAN 100.4 F (38 ?C) OR HIGHER ?*CHILLS OR SWEATING ?*NAUSEA AND VOMITING THAT IS NOT CONTROLLED WITH YOUR NAUSEA MEDICATION ?*UNUSUAL SHORTNESS OF BREATH ?*UNUSUAL BRUISING OR BLEEDING ?*URINARY PROBLEMS (pain or burning when urinating, or frequent urination) ?*BOWEL PROBLEMS (unusual diarrhea, constipation, pain near the anus) ?TENDERNESS IN MOUTH AND THROAT WITH OR WITHOUT PRESENCE OF ULCERS (sore throat, sores in mouth, or a toothache) ?UNUSUAL RASH, SWELLING OR PAIN  ?UNUSUAL VAGINAL DISCHARGE OR ITCHING  ? ?Items with * indicate a potential emergency and should be followed up as soon as possible or go to the Emergency Department if any problems should occur. ? ?Please show the CHEMOTHERAPY ALERT CARD or IMMUNOTHERAPY ALERT  CARD at check-in to the Emergency Department and triage nurse. ? ?Should you have questions after your visit or need to cancel or reschedule your appointment, please contact [facility]  Dept: [phone]  and follow the prompts.  Office hours are 8:00 a.m. to 4:30 p.m. Monday - Friday. Please note that voicemails left after 4:00 p.m. may not be returned until the following business day.  We are closed weekends and major holidays. You have access to a nurse at all times for urgent questions. Please call the main number to the clinic Dept: [phone] and follow the prompts. ? ? ?For any non-urgent questions, you may also contact your provider using MyChart. We now offer e-Visits for anyone 31 and older to request care online for non-urgent symptoms. For details visit [email or web address]. ?  ?Also download the MyChart app! Go to the app store, search "MyChart", open the app, select [facility], and log in with your MyChart username and password. ? ?Due to Covid, a mask is required upon entering the hospital/clinic. If you do not have a mask, one will be given to you upon arrival. For doctor visits, patients may have 1 support person aged 69 or older with them. For treatment visits, patients cannot have anyone with them due to current Covid guidelines and our immunocompromised population.  ? ?

## 2021-12-07 NOTE — Progress Notes (Signed)
Pt declined to stay for 30 min post obs. ?

## 2021-12-15 ENCOUNTER — Ambulatory Visit (HOSPITAL_COMMUNITY)
Admission: RE | Admit: 2021-12-15 | Discharge: 2021-12-15 | Disposition: A | Discharge status: Home / Self Care | Payer: Managed Care, Other (non HMO) | Source: Ambulatory Visit | Attending: Hematology and Oncology | Admitting: Hematology and Oncology

## 2021-12-15 DIAGNOSIS — Z95828 Presence of other vascular implants and grafts: Secondary | ICD-10-CM

## 2021-12-15 DIAGNOSIS — Z452 Encounter for adjustment and management of vascular access device: Secondary | ICD-10-CM | POA: Insufficient documentation

## 2021-12-15 HISTORY — PX: IR REMOVAL TUN ACCESS W/ PORT W/O FL MOD SED: IMG2290

## 2021-12-15 MED ORDER — LIDOCAINE-EPINEPHRINE 1 %-1:100000 IJ SOLN
INTRAMUSCULAR | Status: DC | PRN
  Administered 2021-12-15: 10 mL

## 2021-12-15 MED ORDER — LIDOCAINE-EPINEPHRINE 1 %-1:100000 IJ SOLN
INTRAMUSCULAR | Status: AC
  Filled 2021-12-15: qty 1

## 2022-01-03 ENCOUNTER — Telehealth: Payer: Self-pay | Admitting: *Deleted

## 2022-01-07 ENCOUNTER — Inpatient Hospital Stay: Payer: Managed Care, Other (non HMO) | Attending: Hematology and Oncology | Admitting: Adult Health

## 2022-01-07 ENCOUNTER — Encounter: Payer: Self-pay | Admitting: Adult Health

## 2022-01-07 DIAGNOSIS — Z171 Estrogen receptor negative status [ER-]: Secondary | ICD-10-CM | POA: Diagnosis not present

## 2022-01-07 DIAGNOSIS — C50512 Malignant neoplasm of lower-outer quadrant of left female breast: Secondary | ICD-10-CM | POA: Diagnosis not present
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
## 2022-03-23 ENCOUNTER — Encounter: Payer: Self-pay | Admitting: *Deleted

## 2022-04-11 ENCOUNTER — Ambulatory Visit: Payer: Managed Care, Other (non HMO) | Attending: Hematology and Oncology

## 2022-04-11 VITALS — Wt 166.0 lb

## 2022-04-11 DIAGNOSIS — Z483 Aftercare following surgery for neoplasm: Secondary | ICD-10-CM | POA: Insufficient documentation

## 2022-04-11 NOTE — Therapy (Signed)
OUTPATIENT PHYSICAL THERAPY SOZO SCREENING NOTE   Patient Name: Kristina Harvey MRN: [id number] DOB:1965-08-19, 56 y.o., female Today's Date: 04/11/2022  PCP: Alroy Dust, L.Marlou Sa, MD REFERRING PROVIDER: Nicholas Lose, MD   PT End of Session - 04/11/22 [phone]     Visit Number 5   # unchanged due to screen only   PT Start Time 0846    PT Stop Time 0850    PT Time Calculation (min) 4 min    Activity Tolerance Patient tolerated treatment well    Behavior During Therapy [facility] for tasks assessed/performed             Past Medical History:  Diagnosis Date   Arrhythmia    paroxysmal atrial fibrillation   Cancer [facility])    Family history of breast cancer    Family history of prostate cancer    Family history of stomach cancer    History of radiation therapy    left breast and subclavian  05/18/2021-07/01/2021  Dr Gery Pray   Hyperlipidemia    Hypothyroidism    Thyroid disease    Past Surgical History:  Procedure Laterality Date   BREAST LUMPECTOMY     BREAST LUMPECTOMY WITH RADIOACTIVE SEED AND SENTINEL LYMPH NODE BIOPSY Left 04/16/2021   Procedure: LEFT BREAST LUMPECTOMY WITH RADIOACTIVE SEED AND SENTINEL LYMPH NODE BIOPSY;  Surgeon: Jovita Kussmaul, MD;  Location: [place];  Service: General;  Laterality: Left;   chin implant  1996   IR IMAGING GUIDED PORT INSERTION  11/24/2020   IR REMOVAL TUN ACCESS W/ PORT W/O FL MOD SED  12/15/2021   RADIOACTIVE SEED GUIDED AXILLARY SENTINEL LYMPH NODE Left 04/16/2021   Procedure: RADIOACTIVE SEED GUIDED AXILLARY SENTINEL LYMPH NODE DISSECTION;  Surgeon: Jovita Kussmaul, MD;  Location: [place];  Service: General;  Laterality: Left;   Patient Active Problem List   Diagnosis Date Noted   Port-A-Cath in place 01/07/2021   Family history of breast cancer    Family history of prostate cancer    Family history of stomach cancer    Malignant neoplasm of lower-outer quadrant of left breast of female, estrogen receptor negative ([place]) 11/09/2020   Atrial  fibrillation ([place]) 10/01/2019   Disorder of thyroid gland 10/01/2019   Genetic testing 09/22/2017    REFERRING DIAG: left breast cancer at risk for lymphedema  THERAPY DIAG: Aftercare following surgery for neoplasm  PERTINENT HISTORY: Patient was diagnosed on 10/14/2020 with left grade III invasive ductal carcinoma. She  underwent neoadjuvant chemotherapy from 11/27/220-03/12/2021 followed by a left lumpectomy and sentinel node biopsy (3 negative nodes) on 04/16/2021. Completed radiation   PRECAUTIONS: left UE Lymphedema risk, None  SUBJECTIVE: Pt returns for her 3 month L-Dex screen.  PAIN:  Are you having pain? No  SOZO SCREENING: Patient was assessed today using the SOZO machine to determine the lymphedema index score. This was compared to her baseline score. It was determined that she is within the recommended range when compared to her baseline and no further action is needed at this time. She will continue SOZO screenings. These are done every 3 months for 2 years post operatively followed by every 6 months for 2 years, and then annually.   L-DEX FLOWSHEETS - 04/11/22 0800       L-DEX LYMPHEDEMA SCREENING   Measurement Type Unilateral    L-DEX MEASUREMENT EXTREMITY Upper Extremity    POSITION  Standing    DOMINANT SIDE Right    At Risk Side Left    BASELINE SCORE (  UNILATERAL) -7.3    L-DEX SCORE (UNILATERAL) -3.4    VALUE CHANGE (UNILAT) 3.9              Otelia Limes, PTA 04/11/2022, 8:50 AM

## 2022-05-19 ENCOUNTER — Inpatient Hospital Stay: Payer: Managed Care, Other (non HMO) | Admitting: Hematology and Oncology

## 2022-05-31 NOTE — Progress Notes (Signed)
Patient Care Team: Kristina Harvey, L.Marlou Sa, MD as PCP - General (Family Medicine) Kristina Kussmaul, MD as Consulting Physician (General Surgery) Kristina Lose, MD as Consulting Physician (Hematology and Oncology) Kristina Pray, MD as Consulting Physician (Radiation Oncology)  DIAGNOSIS: No diagnosis found.  SUMMARY OF ONCOLOGIC HISTORY: Oncology History  Malignant neoplasm of lower-outer quadrant of left breast of female, estrogen receptor negative ([place])  08/28/2017 Genetic Testing   Negative. The [facility] gene panel offered by [employer] includes sequencing and deletion/duplication testing of the following 35 genes: APC, ATM, AXIN2, BARD1, BMPR1A, BRCA1, BRCA2, BRIP1, CHD1, CDK4, CDKN2A, CHEK2, EPCAM (large rearrangement only), HOXB13, (sequencing only), GALNT12, MLH1, MSH2, MSH3 (excluding repetitive portions of exon 1), MSH6, MUTYH, NBN, NTHL1, PALB2, PMS2, PTEN, RAD51C, RAD51D, RNF43, RPS20, SMAD4, STK11, and TP53. Sequencing was performed for select regions of POLE and POLD1, and large rearrangement analysis was performed for select regions of GREM1.    11/04/2020 Initial Diagnosis   Screening mammogram showed a possible left breast mass and enlarged axillary lymph nodes. Diagnostic mammogram and US showed a 1.4cm mass at the 3:30 position in the left breast and 2 abnormal lymph nodes in the left axilla. 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   11/11/2020 Cancer Staging   Staging form: Breast, AJCC 8th Edition - Clinical stage from 11/11/2020: Stage IIA (cT1c, cN1, cM0, G3, ER-, PR-, HER2+) - Signed by Kristina Lose, MD on 11/11/2020 Stage prefix: Initial diagnosis Histologic grading system: 3 grade system Laterality: Left Staged by: Pathologist and managing physician Stage used in treatment planning: Yes National guidelines used in treatment planning: Yes Type of national guideline used  in treatment planning: NCCN   11/27/2020 - 04/02/2021 Chemotherapy   TCHP x6 cycles       04/16/2021 Surgery   Left lumpectomy 04/16/2021: Pathologic complete response, 0/3 lymph nodes negative   05/03/2021 - 12/07/2021 Chemotherapy   Patient is on Treatment Plan : BREAST Trastuzumab + Pertuzumab q21d     05/20/2021 - 07/01/2021 Radiation Therapy   Site Technique Total Dose (Gy) Dose per Fx (Gy) Completed Fx Beam Energies  Breast, Left: Breast_Lt 3D 50.4/50.4 1.8 28/28 10X  Breast, Left: Breast_Lt_SCLV 3D 50.4/50.4 1.8 28/28 6X, 10X  Breast, Left: Breast_Lt_Bst 3D 10/10 2 5/5 6X, 10X       CHIEF COMPLIANT: Herceptin Perjeta maintenance    INTERVAL HISTORY: Kristina Harvey is a 56 y.o. with above-mentioned history of breast cancer having completed neoadjuvant chemotherapy with [place] and radiation, currently on maintenance with Herceptin Perjeta. She presents to the clinic today for follow-up and treatment.   ALLERGIES:  is allergic to caffeine.  MEDICATIONS:  Current Outpatient Medications  Medication Sig Dispense Refill   acetaminophen (TYLENOL) 500 MG tablet Take 1,000 mg by mouth every 6 (six) hours as needed for moderate pain.     ALPRAZolam (XANAX) 0.25 MG tablet Take 0.25 mg by mouth at bedtime as needed for sleep.     colestipol (COLESTID) 1 g tablet Take 2 g by mouth at bedtime.     levothyroxine (SYNTHROID) 75 MCG tablet Take 75 mcg by mouth daily before breakfast.     metoprolol succinate (TOPROL-XL) 25 MG 24 hr tablet Take 1 tablet (25 mg total) by mouth daily. 90 tablet 3   metoprolol tartrate (LOPRESSOR) 25 MG tablet Take 1 tablet (25 mg total) by mouth 2 (two) times daily as needed (palpitations). 60 tablet 3  Multiple Vitamin (MULTIVITAMIN WITH MINERALS) TABS tablet Take 1 tablet by mouth daily.     Polyethyl Glycol-Propyl Glycol (SYSTANE OP) Place 1 drop into both eyes 2 (two) times daily.     Probiotic Product (PROBIOTIC PO) Take 1 capsule by mouth daily.      Psyllium (METAMUCIL PO) Take 1 Scoop by mouth daily. (Patient not taking: Reported on 09/09/2021)     No current facility-administered medications for this visit.    PHYSICAL EXAMINATION: ECOG PERFORMANCE STATUS: {CHL ONC ECOG PS:[id number]}  There were no vitals filed for this visit. There were no vitals filed for this visit.  BREAST:*** No palpable masses or nodules in either right or left breasts. No palpable axillary supraclavicular or infraclavicular adenopathy no breast tenderness or nipple discharge. (exam performed in the presence of a chaperone)  LABORATORY DATA:  I have reviewed the data as listed    Latest Ref Rng & Units 12/07/2021   10:02 AM 11/16/2021   10:47 AM 10/26/2021    9:59 AM  CMP  Glucose 70 - 99 mg/dL 105  106  92   BUN 6 - 20 mg/dL 18  15  10    Creatinine 0.44 - 1.00 mg/dL 0.59  0.56  0.63   Sodium 135 - 145 mmol/L 136  136  138   Potassium 3.5 - 5.1 mmol/L 4.2  4.4  3.9   Chloride 98 - 111 mmol/L 107  104  106   CO2 22 - 32 mmol/L 26  28  26    Calcium 8.9 - 10.3 mg/dL 9.1  9.5  8.8   Total Protein 6.5 - 8.1 g/dL 6.9  7.3  6.3   Total Bilirubin 0.3 - 1.2 mg/dL 0.3  0.5  0.4   Alkaline Phos 38 - 126 U/L 61  52  53   AST 15 - 41 U/L 21  22  19    ALT 0 - 44 U/L 19  22  19      Lab Results  Component Value Date   WBC 5.0 12/07/2021   HGB 12.2 12/07/2021   HCT 36.5 12/07/2021   MCV 100.8 (H) 12/07/2021   PLT 206 12/07/2021   NEUTROABS 3.6 12/07/2021    ASSESSMENT & PLAN:  No problem-specific Assessment & Plan notes found for this encounter.    No orders of the defined types were placed in this encounter.  The patient has a good understanding of the overall plan. she agrees with it. she will call with any problems that may develop before the next visit here. Total time spent: 30 mins including face to face time and time spent for planning, charting and co-ordination of care   Suzzette Righter, [place] 05/31/22    I [name] Coins am scribing  for Dr. Lindi Adie  ***

## 2022-06-02 ENCOUNTER — Other Ambulatory Visit: Payer: Self-pay

## 2022-06-02 ENCOUNTER — Inpatient Hospital Stay: Payer: Managed Care, Other (non HMO) | Attending: Hematology and Oncology | Admitting: Hematology and Oncology

## 2022-06-02 DIAGNOSIS — Z171 Estrogen receptor negative status [ER-]: Secondary | ICD-10-CM

## 2022-06-02 DIAGNOSIS — G629 Polyneuropathy, unspecified: Secondary | ICD-10-CM | POA: Insufficient documentation

## 2022-06-02 DIAGNOSIS — Z853 Personal history of malignant neoplasm of breast: Secondary | ICD-10-CM | POA: Insufficient documentation

## 2022-06-02 DIAGNOSIS — Z923 Personal history of irradiation: Secondary | ICD-10-CM | POA: Diagnosis not present

## 2022-06-02 DIAGNOSIS — C50512 Malignant neoplasm of lower-outer quadrant of left female breast: Secondary | ICD-10-CM

## 2022-06-02 NOTE — Assessment & Plan Note (Signed)
11/04/2020:Screening mammogram showed a possible left breast mass and enlarged axillary lymph nodes. Diagnostic mammogram and US showed a 1.4cm mass at the 3:30 position in the left breast and 2 abnormal lymph nodes in the left axilla. 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  Treatment Plan: 1.Neoadjuvant chemotherapy with [place] Perjeta 6 cyclescompleted 7/29/2022followed by Herceptin Perjeta maintenance versus Kadcyla maintenance (based on response to neoadjuvant chemo) for 1 year completed 12/06/21 2. Left lumpectomy 04/16/2021: Pathologic complete response, 0/3 lymph nodes negative 3. Followed by adjuvant radiation therapy10/12/2020-07/01/2021 4.Consideration for neratinib (Did not want to take it) ------------------------------------------------------------------------------------------------------------------------------- Current Treatment:Surveillance Breast Cancer Surveillance: Mammogram 11/03/2021: Benign breast density category C Breast Exam: 06/02/22: Benign    Peripheral neuropathy: 4/10:I recommended participation in the phase 2 ACCRU [place] 2102 study of N-Palmitoylrthanolamide vs placebo for the treatment of chemo induced peripheral neuropathy.  She decided not to participate in the study     We will see her back in 1 year

## 2022-06-03 ENCOUNTER — Telehealth: Payer: Self-pay | Admitting: Hematology and Oncology

## 2022-06-03 NOTE — Telephone Encounter (Signed)
Scheduled appointment per 10/19 los. Patient is aware. 

## 2022-07-11 ENCOUNTER — Ambulatory Visit: Payer: Managed Care, Other (non HMO) | Attending: Hematology and Oncology

## 2022-07-11 VITALS — Wt 178.1 lb

## 2022-07-11 DIAGNOSIS — Z483 Aftercare following surgery for neoplasm: Secondary | ICD-10-CM | POA: Insufficient documentation

## 2022-07-11 NOTE — Therapy (Signed)
OUTPATIENT PHYSICAL THERAPY SOZO SCREENING NOTE   Patient Name: Kristina Harvey MRN: [id number] DOB:05/14/1966, 56 y.o., female Today's Date: 07/11/2022  PCP: Alroy Dust, L.Marlou Sa, MD REFERRING PROVIDER: Nicholas Lose, MD   PT End of Session - 07/11/22 0920     Visit Number 5   # unchanged due to screen only   PT Start Time 0919    PT Stop Time 0923    PT Time Calculation (min) 4 min    Activity Tolerance Patient tolerated treatment well    Behavior During Therapy [facility] for tasks assessed/performed             Past Medical History:  Diagnosis Date   Arrhythmia    paroxysmal atrial fibrillation   Cancer [facility])    Family history of breast cancer    Family history of prostate cancer    Family history of stomach cancer    History of radiation therapy    left breast and subclavian  05/18/2021-07/01/2021  Dr Gery Pray   Hyperlipidemia    Hypothyroidism    Thyroid disease    Past Surgical History:  Procedure Laterality Date   BREAST LUMPECTOMY     BREAST LUMPECTOMY WITH RADIOACTIVE SEED AND SENTINEL LYMPH NODE BIOPSY Left 04/16/2021   Procedure: LEFT BREAST LUMPECTOMY WITH RADIOACTIVE SEED AND SENTINEL LYMPH NODE BIOPSY;  Surgeon: Jovita Kussmaul, MD;  Location: [place];  Service: General;  Laterality: Left;   chin implant  1996   IR IMAGING GUIDED PORT INSERTION  11/24/2020   IR REMOVAL TUN ACCESS W/ PORT W/O FL MOD SED  12/15/2021   RADIOACTIVE SEED GUIDED AXILLARY SENTINEL LYMPH NODE Left 04/16/2021   Procedure: RADIOACTIVE SEED GUIDED AXILLARY SENTINEL LYMPH NODE DISSECTION;  Surgeon: Jovita Kussmaul, MD;  Location: [place];  Service: General;  Laterality: Left;   Patient Active Problem List   Diagnosis Date Noted   Port-A-Cath in place 01/07/2021   Family history of breast cancer    Family history of prostate cancer    Family history of stomach cancer    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 She  underwent neoadjuvant chemotherapy from 11/27/220-03/12/2021 followed by a left lumpectomy and sentinel node biopsy (3 negative nodes) on 04/16/2021. Completed radiation   PRECAUTIONS: left UE Lymphedema risk, None  SUBJECTIVE: Pt returns for her 3 month L-Dex screen.  PAIN:  Are you having pain? No  SOZO SCREENING: Patient was assessed today using the SOZO machine to determine the lymphedema index score. This was compared to her baseline score. It was determined that she is within the recommended range when compared to her baseline and no further action is needed at this time. She will continue SOZO screenings. These are done every 3 months for 2 years post operatively followed by every 6 months for 2 years, and then annually.   L-DEX FLOWSHEETS - 07/11/22 0900       L-DEX LYMPHEDEMA SCREENING   Measurement Type Unilateral    L-DEX MEASUREMENT EXTREMITY Upper Extremity    POSITION  Standing    DOMINANT SIDE Right    At Risk Side Left    BASELINE SCORE (  UNILATERAL) -7.3    L-DEX SCORE (UNILATERAL) -0.9    VALUE CHANGE (UNILAT) 6.4              Otelia Limes, PTA 07/11/2022, 9:22 AM

## 2022-08-03 ENCOUNTER — Other Ambulatory Visit: Payer: Self-pay | Admitting: Cardiovascular Disease

## 2022-08-05 ENCOUNTER — Encounter: Payer: Self-pay | Admitting: Hematology and Oncology

## 2022-08-05 ENCOUNTER — Telehealth: Payer: Self-pay | Admitting: *Deleted

## 2022-08-05 NOTE — Telephone Encounter (Signed)
Received call from pt with complaint of new nodule located on her left breast behind her incision.  Pt states nodule has been present x1 week and denies recent injury or trauma.  Pt states her left breast is swollen due to chronic lymphedema.  [facility] visit scheduled, pt verbalized understanding of appt date and time.

## 2022-08-12 ENCOUNTER — Other Ambulatory Visit: Payer: Self-pay

## 2022-08-12 ENCOUNTER — Inpatient Hospital Stay: Payer: Managed Care, Other (non HMO) | Attending: Hematology and Oncology | Admitting: Adult Health

## 2022-08-12 ENCOUNTER — Telehealth: Payer: Self-pay

## 2022-08-12 ENCOUNTER — Encounter: Payer: Self-pay | Admitting: Adult Health

## 2022-08-12 VITALS — BP 139/85 | HR 80 | Temp 97.8°F | Resp 18 | Ht 67.5 in | Wt 180.0 lb

## 2022-08-12 DIAGNOSIS — Z803 Family history of malignant neoplasm of breast: Secondary | ICD-10-CM | POA: Insufficient documentation

## 2022-08-12 DIAGNOSIS — Z171 Estrogen receptor negative status [ER-]: Secondary | ICD-10-CM | POA: Insufficient documentation

## 2022-08-12 DIAGNOSIS — Z800 Family history of malignant neoplasm of digestive organs: Secondary | ICD-10-CM | POA: Diagnosis not present

## 2022-08-12 DIAGNOSIS — R2232 Localized swelling, mass and lump, left upper limb: Secondary | ICD-10-CM

## 2022-08-12 DIAGNOSIS — C50512 Malignant neoplasm of lower-outer quadrant of left female breast: Secondary | ICD-10-CM | POA: Insufficient documentation

## 2022-08-12 DIAGNOSIS — Z8042 Family history of malignant neoplasm of prostate: Secondary | ICD-10-CM | POA: Insufficient documentation

## 2022-08-12 NOTE — Telephone Encounter (Signed)
Called Pt to inform of breast US and MM appt. Pt scheduled for Jan 8th at 0900. Pt and Wilber Bihari, NP aware. No further questions from Pt.

## 2022-08-12 NOTE — Assessment & Plan Note (Signed)
Kristina Harvey is a 56 year old woman with history of stage IIa HER2 positive breast cancer diagnosed in March 2022 she is status post neoadjuvant chemotherapy followed by lumpectomy, adjuvant radiation, and maintenance Herceptin Perjeta which completed in April 2023.  Her mammogram in March 2023 showed no mammographic evidence of malignancy.  I let her know that because of this new left axillary thickness/nodularity we need to further evaluate with diagnostic left breast mammogram and ultrasound.  I placed an order for this to be completed.  It is scheduled for January 8 and I have asked Loel Ro if she is able to help [place] get this moved to sometime next week--we will follow-up with her sooner than her already scheduled follow-up with Dr. Lindi Adie in October 2024 if needed.

## 2022-08-12 NOTE — Progress Notes (Signed)
[facility] Follow up:    Alroy Dust, L.Marlou Sa, [place] [address] [place] [address]   DIAGNOSIS:  Cancer Staging  Malignant neoplasm of lower-outer quadrant of left breast of female, estrogen receptor negative ([place]) Staging form: Breast, AJCC 8th Edition - Clinical stage from 11/11/2020: Stage IIA (cT1c, cN1, cM0, G3, ER-, PR-, HER2+) - Signed by Nicholas Lose, MD on 11/11/2020 Stage prefix: Initial diagnosis Histologic grading system: 3 grade system Laterality: Left Staged by: Pathologist and managing physician Stage used in treatment planning: Yes National guidelines used in treatment planning: Yes Type of national guideline used in treatment planning: NCCN   SUMMARY OF ONCOLOGIC HISTORY: Oncology History  Malignant neoplasm of lower-outer quadrant of left breast of female, estrogen receptor negative ([place])  08/28/2017 Genetic Testing   Negative. The [facility] gene panel offered by [employer] includes sequencing and deletion/duplication testing of the following 35 genes: APC, ATM, AXIN2, BARD1, BMPR1A, BRCA1, BRCA2, BRIP1, CHD1, CDK4, CDKN2A, CHEK2, EPCAM (large rearrangement only), HOXB13, (sequencing only), GALNT12, MLH1, MSH2, MSH3 (excluding repetitive portions of exon 1), MSH6, MUTYH, NBN, NTHL1, PALB2, PMS2, PTEN, RAD51C, RAD51D, RNF43, RPS20, SMAD4, STK11, and TP53. Sequencing was performed for select regions of POLE and POLD1, and large rearrangement analysis was performed for select regions of GREM1.    11/04/2020 Initial Diagnosis   Screening mammogram showed a possible left breast mass and enlarged axillary lymph nodes. Diagnostic mammogram and US showed a 1.4cm mass at the 3:30 position in the left breast and 2 abnormal lymph nodes in the left axilla. 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   11/11/2020 Cancer Staging   Staging form:  Breast, AJCC 8th Edition - Clinical stage from 11/11/2020: Stage IIA (cT1c, cN1, cM0, G3, ER-, PR-, HER2+) - Signed by Nicholas Lose, MD on 11/11/2020 Stage prefix: Initial diagnosis Histologic grading system: 3 grade system Laterality: Left Staged by: Pathologist and managing physician Stage used in treatment planning: Yes National guidelines used in treatment planning: Yes Type of national guideline used in treatment planning: NCCN   11/27/2020 - 04/02/2021 Chemotherapy   TCHP x6 cycles       04/16/2021 Surgery   Left lumpectomy 04/16/2021: Pathologic complete response, 0/3 lymph nodes negative   05/03/2021 - 12/07/2021 Chemotherapy   Patient is on Treatment Plan : BREAST Trastuzumab + Pertuzumab q21d     05/20/2021 - 07/01/2021 Radiation Therapy   Site Technique Total Dose (Gy) Dose per Fx (Gy) Completed Fx Beam Energies  Breast, Left: Breast_Lt 3D 50.4/50.4 1.8 28/28 10X  Breast, Left: Breast_Lt_SCLV 3D 50.4/50.4 1.8 28/28 6X, 10X  Breast, Left: Breast_Lt_Bst 3D 10/10 2 5/5 6X, 10X       CURRENT THERAPY: observation  INTERVAL HISTORY: SHEZA STRICKLAND 56 y.o. female returns for a left axillary nodule that  popped up a week or two ago.  She is unsure what it is.  Her most recent mammogram occurred on 11/03/2021 and was negative for malignancy.  She has breast density category C.    Patient Active Problem List   Diagnosis Date Noted   Port-A-Cath in place 01/07/2021   Family history of breast cancer    Family history of prostate cancer    Family history of stomach cancer    Malignant neoplasm of lower-outer quadrant of left breast of female, estrogen receptor negative ([place]) 11/09/2020   Atrial fibrillation ([place]) 10/01/2019   Disorder of  thyroid gland 10/01/2019   Genetic testing 09/22/2017    is allergic to caffeine.  MEDICAL HISTORY: Past Medical History:  Diagnosis Date   Arrhythmia    paroxysmal atrial fibrillation   Cancer [facility])    Family history of breast cancer     Family history of prostate cancer    Family history of stomach cancer    History of radiation therapy    left breast and subclavian  05/18/2021-07/01/2021  Dr Gery Pray   Hyperlipidemia    Hypothyroidism    Thyroid disease     SURGICAL HISTORY: Past Surgical History:  Procedure Laterality Date   BREAST LUMPECTOMY     BREAST LUMPECTOMY WITH RADIOACTIVE SEED AND SENTINEL LYMPH NODE BIOPSY Left 04/16/2021   Procedure: LEFT BREAST LUMPECTOMY WITH RADIOACTIVE SEED AND SENTINEL LYMPH NODE BIOPSY;  Surgeon: Jovita Kussmaul, MD;  Location: [place];  Service: General;  Laterality: Left;   chin implant  1996   IR IMAGING GUIDED PORT INSERTION  11/24/2020   IR REMOVAL TUN ACCESS W/ PORT W/O FL MOD SED  12/15/2021   RADIOACTIVE SEED GUIDED AXILLARY SENTINEL LYMPH NODE Left 04/16/2021   Procedure: RADIOACTIVE SEED GUIDED AXILLARY SENTINEL LYMPH NODE DISSECTION;  Surgeon: Jovita Kussmaul, MD;  Location: [facility];  Service: General;  Laterality: Left;    SOCIAL HISTORY: Social History   Socioeconomic History   Marital status: Married    Spouse name: Not on file   Number of children: Not on file   Years of education: Not on file   Highest education level: Not on file  Occupational History   Not on file  Tobacco Use   Smoking status: Never   Smokeless tobacco: Never  Vaping Use   Vaping Use: Never used  Substance and Sexual Activity   Alcohol use: Yes    Comment: Socially   Drug use: Never   Sexual activity: Not on file  Other Topics Concern   Not on file  Social History Narrative   Not on file   Social Determinants of Health   Financial Resource Strain: Not on file  Food Insecurity: Not on file  Transportation Needs: Not on file  Physical Activity: Not on file  Stress: Not on file  Social Connections: Not on file  Intimate Partner Violence: Not on file    FAMILY HISTORY: Family History  Problem Relation Age of Onset   Breast cancer Mother 24       bilateral   Atrial  fibrillation Father    Breast cancer Maternal Grandmother 65   Breast cancer Paternal Grandmother 71   Breast cancer Maternal Aunt 67   Prostate cancer Maternal Uncle 68   Stomach cancer Paternal Grandfather 74   Heart Problems Maternal Aunt     Review of Systems  Constitutional:  Negative for appetite change, chills, fatigue, fever and unexpected weight change.  HENT:   Negative for hearing loss, lump/mass and trouble swallowing.   Eyes:  Negative for eye problems and icterus.  Respiratory:  Negative for chest tightness, cough and shortness of breath.   Cardiovascular:  Negative for chest pain, leg swelling and palpitations.  Gastrointestinal:  Negative for abdominal distention, abdominal pain, constipation, diarrhea, nausea and vomiting.  Endocrine: Negative for hot flashes.  Genitourinary:  Negative for difficulty urinating.   Musculoskeletal:  Negative for arthralgias.  Skin:  Negative for itching and rash.  Neurological:  Negative for dizziness, extremity weakness, headaches and numbness.  Hematological:  Negative for adenopathy. Does not bruise/bleed  easily.  Psychiatric/Behavioral:  Negative for depression. The patient is not nervous/anxious.       PHYSICAL EXAMINATION  ECOG PERFORMANCE STATUS: 1 - Symptomatic but completely ambulatory  Vitals:   08/12/22 0835  BP: 139/85  Pulse: 80  Resp: 18  Temp: 97.8 F (36.6 C)  SpO2: 100%    Physical Exam Constitutional:      General: She is not in acute distress.    Appearance: Normal appearance. She is not toxic-appearing.  HENT:     Head: Normocephalic and atraumatic.  Eyes:     General: No scleral icterus. Cardiovascular:     Rate and Rhythm: Normal rate and regular rhythm.     Pulses: Normal pulses.     Heart sounds: Normal heart sounds.  Pulmonary:     Effort: Pulmonary effort is normal.     Breath sounds: Normal breath sounds.  Chest:     Comments: Left breast is swollen, slightly erythematous, and left  axilla with some nodularity and thickness to the left of her scar line from her sentinel node biopsy Abdominal:     General: Abdomen is flat. Bowel sounds are normal. There is no distension.     Palpations: Abdomen is soft.     Tenderness: There is no abdominal tenderness.  Musculoskeletal:        General: No swelling.     Cervical back: Neck supple.  Lymphadenopathy:     Cervical: No cervical adenopathy.  Skin:    General: Skin is warm and dry.     Findings: No rash.  Neurological:     General: No focal deficit present.     Mental Status: She is alert.  Psychiatric:        Mood and Affect: Mood normal.        Behavior: Behavior normal.     LABORATORY DATA: None for this visit  ASSESSMENT and THERAPY PLAN:   Malignant neoplasm of lower-outer quadrant of left breast of female, estrogen receptor negative ([place]) Kristina Harvey is a 56 year old woman with history of stage IIa HER2 positive breast cancer diagnosed in March 2022 she is status post neoadjuvant chemotherapy followed by lumpectomy, adjuvant radiation, and maintenance Herceptin Perjeta which completed in April 2023.  Her mammogram in March 2023 showed no mammographic evidence of malignancy.  I let her know that because of this new left axillary thickness/nodularity we need to further evaluate with diagnostic left breast mammogram and ultrasound.  I placed an order for this to be completed.  It is scheduled for January 8 and I have asked Loel Ro if she is able to help [place] get this moved to sometime next week--we will follow-up with her sooner than her already scheduled follow-up with Dr. Lindi Adie in October 2024 if needed.   All questions were answered. The patient knows to call the clinic with any problems, questions or concerns. We can certainly see the patient much sooner if necessary.  Total encounter time:20 minutes*in face-to-face visit time, chart review, lab review, care coordination, order entry, and documentation of the  encounter time.    Wilber Bihari, NP 08/12/22 1:41 PM Medical Oncology and Hematology [facility] [place],  [address] Tel. [phone]    Fax. [phone]  *Total Encounter Time as defined by the Centers for Medicare and Medicaid Services includes, in addition to the face-to-face time of a patient visit (documented in the note above) non-face-to-face time: obtaining and reviewing outside history, ordering and reviewing medications, tests or procedures,  care coordination (communications with other health care professionals or caregivers) and documentation in the medical record.

## 2022-08-16 ENCOUNTER — Ambulatory Visit
Admission: RE | Admit: 2022-08-16 | Discharge: 2022-08-16 | Disposition: A | Discharge status: Home / Self Care | Payer: Managed Care, Other (non HMO) | Source: Ambulatory Visit | Attending: Adult Health | Admitting: Adult Health

## 2022-08-16 DIAGNOSIS — R2232 Localized swelling, mass and lump, left upper limb: Secondary | ICD-10-CM

## 2022-08-16 DIAGNOSIS — Z171 Estrogen receptor negative status [ER-]: Secondary | ICD-10-CM

## 2022-08-17 ENCOUNTER — Other Ambulatory Visit: Payer: Self-pay | Admitting: Hematology and Oncology

## 2022-08-17 DIAGNOSIS — Z1231 Encounter for screening mammogram for malignant neoplasm of breast: Secondary | ICD-10-CM

## 2022-08-22 ENCOUNTER — Other Ambulatory Visit: Payer: Managed Care, Other (non HMO)

## 2022-08-24 IMAGING — RF DG UGI W/ HIGH DENSITY W/O KUB
4 of 9 series · 8 of 16 positions shown · non-contrast
Comparison: CT AP, 06/09/2016.  Chest XR, 12/16/2020.

CLINICAL DATA: Family history of gastric cancer. Patient complains
of diarrhea after meals.

EXAM:
UPPER GI SERIES WITH HIGH DENSITY WITHOUT KUB
TECHNIQUE: Scout radiograph was obtained. Combined double and single contrast
examination was performed using effervescent crystals, high-density
barium and thin liquid barium. This exam was performed and
interpreted by Khaira, Heike.
FLUOROSCOPY:
Radiation Exposure Index (as provided by the fluoroscopic device):
63 mGy mGy Kerma

[Series 1: w abdomen upright · 1 of 1 slices shown]
[im 1/1]
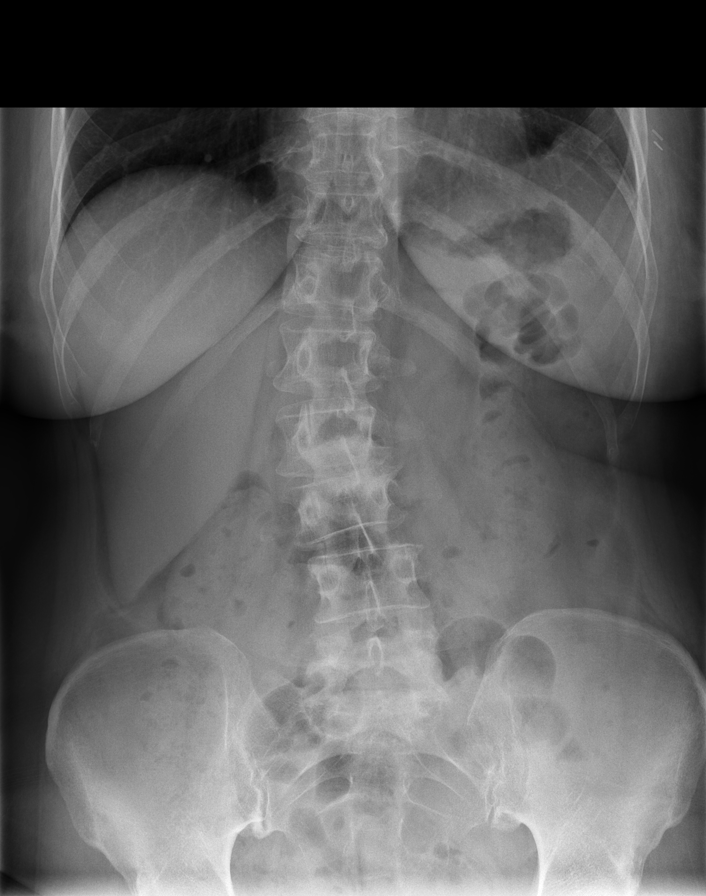

[Series 3: fluoro_barium 2fps_bw · 3 of 4 frames shown (1 of 2)]
[frame 1/4]
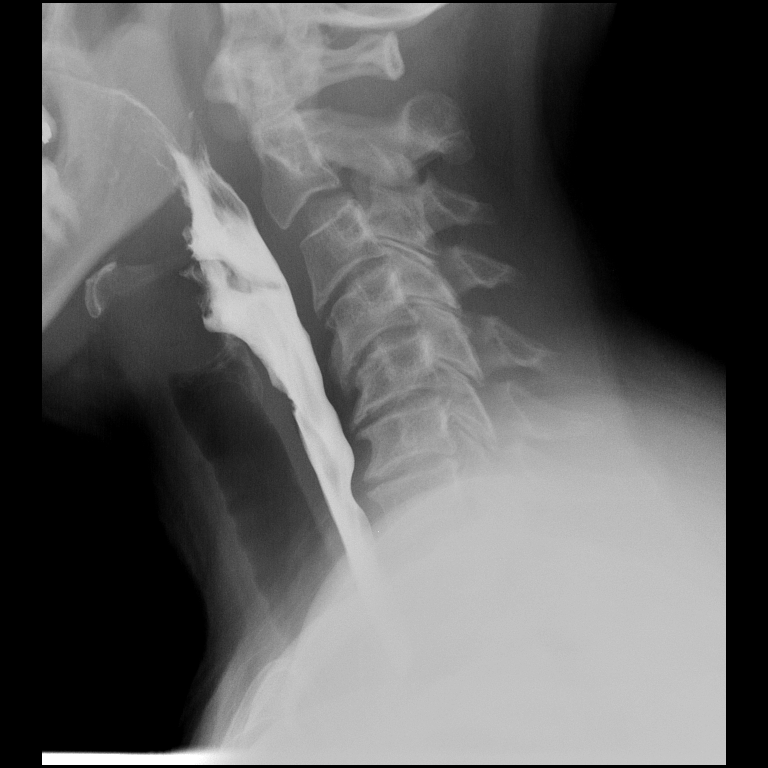
[frame 3/4]
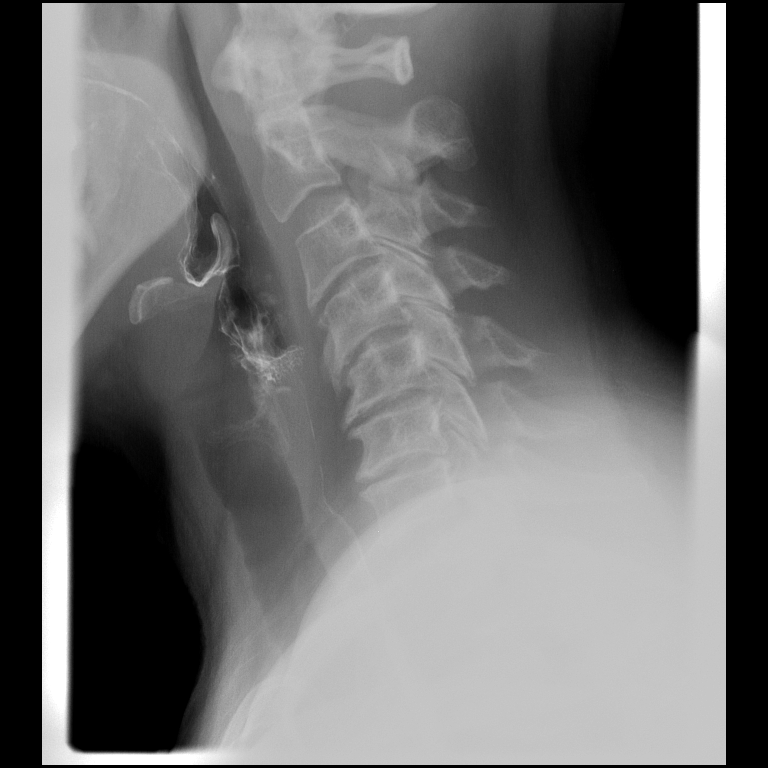
[frame 4/4]
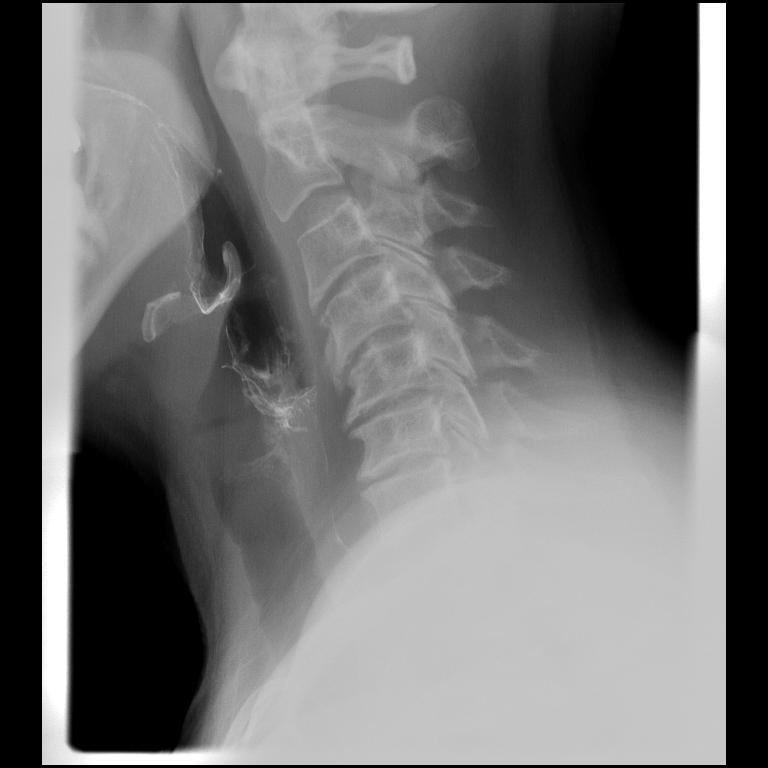

[Series 4: fluoro_barium 2fps_bw · 3 of 3 frames shown (2 of 2)]
[frame 1/3]
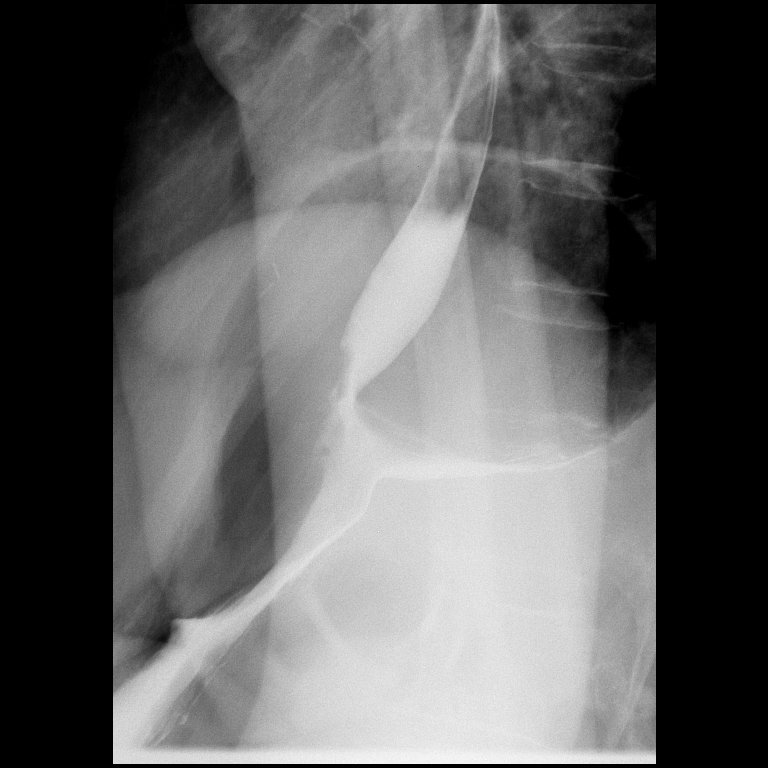
[frame 2/3]
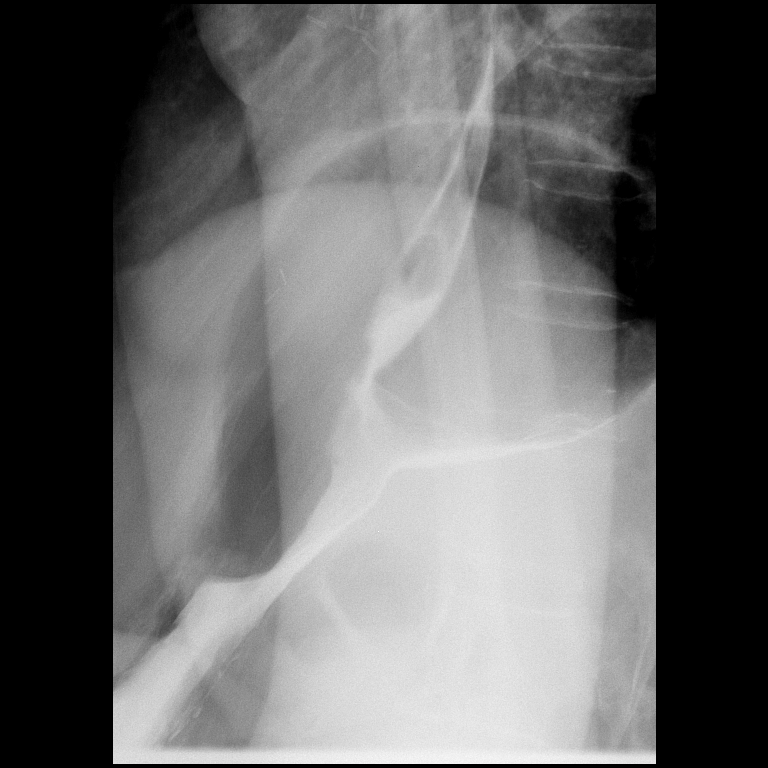
[frame 3/3]
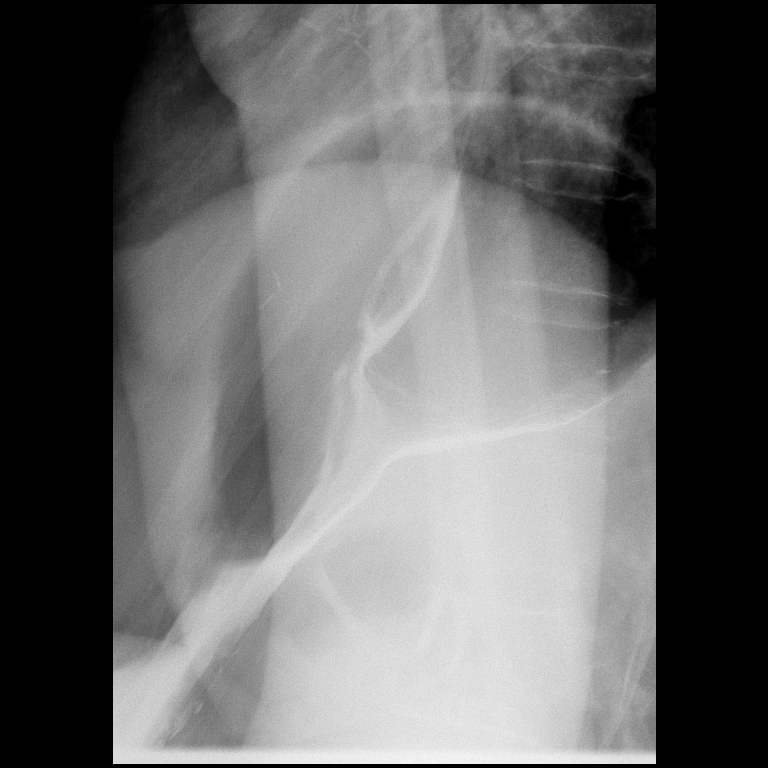

[Series 5: cp_standard · 1 of 1 slices shown]
[im 1/1]
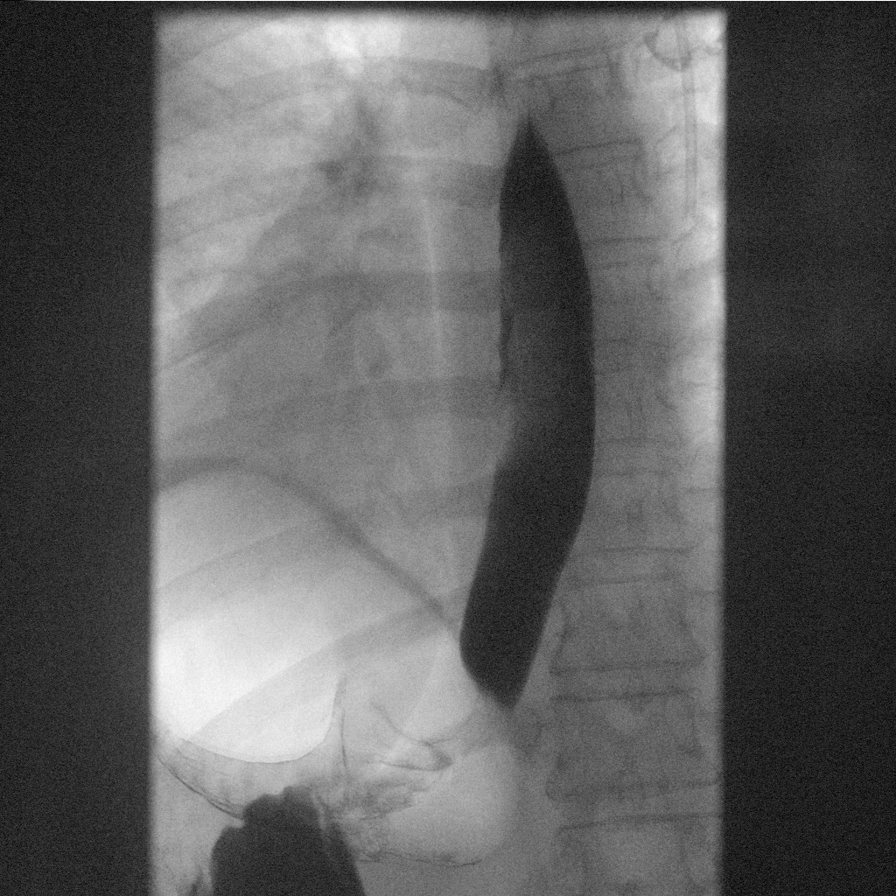

[8 of 16 positions shown; findings below may reference images not displayed]

FINDINGS: Scout Radiograph: Nonobstructed bowel gas pattern. LEFT breast
surgical changes. Degenerative changes of spine, greatest at L2-3.

Esophagus: Normal appearance.

Esophageal motility: Within normal limits.

Gastroesophageal reflux: None visualized.

Ingested 13mm barium tablet: Passed normally

Stomach: Normal appearance. No hiatal hernia.

Gastric emptying: Rapid transit, with contrast noted within central
and small bowel within minutes.

Duodenum: Normal appearance.

Other:  None.
IMPRESSION: 1. No radiographic evidence of structural abnormality
2. Rapid transit, with contrast reaching central/mid small bowel
within minutes.
Findings are consistent with patient's reported symptoms of diarrhea
following meals.

## 2022-09-06 NOTE — Progress Notes (Unsigned)
Cardiology Office Note  Date:  09/07/2022   ID:  Kristina Harvey, DOB [birth year], MRN [id number]  PCP:  Kristina Graff.Marlou Sa, MD   Chief Complaint  Patient presents with   Follow-up    Patient reports feeling intermittent palpitations x 3 years.    HPI:  Kristina Harvey is a 57 y.o. female with past medical history of Paroxysmal tachycardia dating back several years Periodic alcohol Snoring, unable to exclude sleep apnea Hyperlipidemia stage IIa HER2 positive breast cancer diagnosed in March 2022 she is status post neoadjuvant chemotherapy followed by lumpectomy, adjuvant radiation, and maintenance Herceptin Perjeta which completed in April 2023.  Echocardiogram April 2022, several studies, most recently January 2023 EF 60% Presents for follow-up of her atrial fibrillation  Last seen by myself December 2022 History of breast cancer , TCHP x6 cycles , Trastuzumab + Pertuzumab q21d , XRT 33 rounds Has made full recovery, treatments completed  Has appreciated increasing frequency of paroxysmal tachycardia Will often do a vagal maneuver to get the tachycardia to break Compliant with metoprolol succinate 25 daily, has been taking metoprolol to tartrate for breakthrough tachycardia  Cardiac risk factors Nonsmoker No diabetes  Prior history of IBS, treated with colestipol  EKG personally reviewed by myself on todays visit Shows normal sinus rhythm rate 82 bpm no significant ST-T wave changes   Lab work reviewed from primary care total cholesterol 230, LDL 143 August 2021  Since then she reports she has started the colestipol  Scheduled for repeat lab work  prior episode;06/30/19 Felt lightheaded getting out of the car with some diaphoresis Went to urgent care EKG documented atrial fibrillation, transferred by EMS to the emergency room On route was given medication Converted to normal sinus rhythm by the time she got to the emergency room A. fib lasting 2 hours  Husband reported that  she has snoring  paroxysmal tachycardia dating back several years  CT abdomen pelvis 2017  showing no significant aortic atherosclerosis   PMH:   has a past medical history of Arrhythmia, Cancer ([place]), Family history of breast cancer, Family history of prostate cancer, Family history of stomach cancer, History of radiation therapy, Hyperlipidemia, Hypothyroidism, and Thyroid disease.  PSH:    Past Surgical History:  Procedure Laterality Date   BREAST LUMPECTOMY     BREAST LUMPECTOMY WITH RADIOACTIVE SEED AND SENTINEL LYMPH NODE BIOPSY Left 04/16/2021   Procedure: LEFT BREAST LUMPECTOMY WITH RADIOACTIVE SEED AND SENTINEL LYMPH NODE BIOPSY;  Surgeon: Jovita Kussmaul, MD;  Location: [facility];  Service: General;  Laterality: Left;   chin implant  1996   IR IMAGING GUIDED PORT INSERTION  11/24/2020   IR REMOVAL TUN ACCESS W/ PORT W/O FL MOD SED  12/15/2021   RADIOACTIVE SEED GUIDED AXILLARY SENTINEL LYMPH NODE Left 04/16/2021   Procedure: RADIOACTIVE SEED GUIDED AXILLARY SENTINEL LYMPH NODE DISSECTION;  Surgeon: Jovita Kussmaul, MD;  Location: [facility];  Service: General;  Laterality: Left;    Current Outpatient Medications  Medication Sig Dispense Refill   acetaminophen (TYLENOL) 500 MG tablet Take 1,000 mg by mouth every 6 (six) hours as needed for moderate pain.     ALPRAZolam (XANAX) 0.25 MG tablet Take 0.25 mg by mouth at bedtime as needed for sleep.     clindamycin (CLEOCIN T) 1 % lotion Apply topically as directed.     colestipol (COLESTID) 1 g tablet Take 1 g by mouth at bedtime.     levothyroxine (SYNTHROID) 75 MCG tablet Take 75 mcg by  mouth daily before breakfast.     Multiple Vitamin (MULTIVITAMIN WITH MINERALS) TABS tablet Take 1 tablet by mouth daily.     Polyethyl Glycol-Propyl Glycol (SYSTANE OP) Place 1 drop into both eyes 2 (two) times daily.     Probiotic Product (PROBIOTIC PO) Take 1 capsule by mouth daily.     Psyllium (METAMUCIL PO) Take 1 Scoop by mouth daily.     metoprolol  succinate (TOPROL-XL) 50 MG 24 hr tablet Take 1 tablet (50 mg total) by mouth daily. 90 tablet 3   metoprolol tartrate (LOPRESSOR) 25 MG tablet Take 1 tablet (25 mg total) by mouth 2 (two) times daily as needed (palpitations). 60 tablet 3   No current facility-administered medications for this visit.     Allergies:   Caffeine   Social History:  The patient  reports that she has never smoked. She has never used smokeless tobacco. She reports current alcohol use. She reports that she does not use drugs.   Family History:   family history includes Atrial fibrillation in her father; Breast cancer (age of onset: 6) in her maternal grandmother; Breast cancer (age of onset: 63) in her mother; Breast cancer (age of onset: 34) in her maternal aunt; Breast cancer (age of onset: 45) in her paternal grandmother; Heart Problems in her maternal aunt; Prostate cancer (age of onset: 43) in her maternal uncle; Stomach cancer (age of onset: 43) in her paternal grandfather.    Review of Systems: Review of Systems  Constitutional: Negative.   HENT: Negative.    Respiratory: Negative.    Cardiovascular: Negative.   Gastrointestinal: Negative.   Musculoskeletal: Negative.   Neurological: Negative.   Psychiatric/Behavioral: Negative.    All other systems reviewed and are negative.   PHYSICAL EXAM: VS:  BP 130/60 (BP Location: Left Arm, Patient Position: Sitting, Cuff Size: Normal)   Pulse 82   Ht 5' 7.5" (1.715 m)   Wt 180 lb (81.6 kg)   SpO2 98%   BMI 27.78 kg/m  , BMI Body mass index is 27.78 kg/m. Constitutional:  oriented to person, place, and time. No distress.  HENT:  Head: Grossly normal Eyes:  no discharge. No scleral icterus.  Neck: No JVD, no carotid bruits  Cardiovascular: Regular rate and rhythm, no murmurs appreciated Pulmonary/Chest: Clear to auscultation bilaterally, no wheezes or rails Abdominal: Soft.  no distension.  no tenderness.  Musculoskeletal: Normal range of  motion Neurological:  normal muscle tone. Coordination normal. No atrophy Skin: Skin warm and dry Psychiatric: normal affect, pleasant  Recent Labs: 12/07/2021: ALT 19; BUN 18; Creatinine 0.59; Hemoglobin 12.2; Platelet Count 206; Potassium 4.2; Sodium 136    Lipid Panel No results found for: "CHOL", "HDL", "LDLCALC", "TRIG"    Wt Readings from Last 3 Encounters:  09/07/22 180 lb (81.6 kg)  08/12/22 180 lb (81.6 kg)  07/11/22 178 lb 2 oz (80.8 kg)     ASSESSMENT AND PLAN:  Problem List Items Addressed This Visit       Cardiology Problems   Atrial fibrillation ([place]) - Primary   Relevant Medications   metoprolol tartrate (LOPRESSOR) 25 MG tablet   metoprolol succinate (TOPROL-XL) 50 MG 24 hr tablet   Other Relevant Orders   EKG 12-Lead   LONG TERM MONITOR (3-14 DAYS)   Other Visit Diagnoses     Mixed hyperlipidemia       Relevant Medications   metoprolol tartrate (LOPRESSOR) 25 MG tablet   metoprolol succinate (TOPROL-XL) 50 MG 24 hr tablet  Other Relevant Orders   EKG 12-Lead     Paroxysmal atrial fibrillation Having more breakthrough tachypalpitations concerning for atrial fibrillation Zio monitor ordered for further evaluation and exclude other arrhythmia such as atrial tachycardia or SVT We have recommended increasing dose of metoprolol succinate 50 milligrams daily Would use metoprolol tartrate as needed For breakthrough symptoms on metoprolol succinate 50, may need to try 75 mg or add antiarrhythmic such as flecainide if documentation of atrial fibrillation Currently not on anticoagulation, low CHADS VASC 1  Hyperlipidemia CT scan images previously reviewed showing no aortic atherosclerosis from top of kidneys through pelvis Previously declined statins, Zetia  Breast cancer, on left Has completed chemotherapy, radiation   Total encounter time more than 30 minutes  Greater than 50% was spent in counseling and coordination of care with the  patient    Signed, Esmond Plants, M.D., Ph.D. [place], [place]

## 2022-09-07 ENCOUNTER — Ambulatory Visit: Payer: Managed Care, Other (non HMO) | Attending: Cardiovascular Disease | Admitting: Cardiovascular Disease

## 2022-09-07 ENCOUNTER — Encounter: Payer: Self-pay | Admitting: Cardiovascular Disease

## 2022-09-07 ENCOUNTER — Ambulatory Visit: Payer: Managed Care, Other (non HMO)

## 2022-09-07 VITALS — BP 130/60 | HR 82 | Ht 67.5 in | Wt 180.0 lb

## 2022-09-07 DIAGNOSIS — I480 Paroxysmal atrial fibrillation: Secondary | ICD-10-CM | POA: Diagnosis not present

## 2022-09-07 DIAGNOSIS — E782 Mixed hyperlipidemia: Secondary | ICD-10-CM

## 2022-09-07 MED ORDER — METOPROLOL TARTRATE 25 MG PO TABS: 25.0000 mg | ORAL_TABLET | Freq: Two times a day (BID) | ORAL | 3 refills | Status: DC | PRN

## 2022-09-07 MED ORDER — METOPROLOL SUCCINATE ER 50 MG PO TB24: 50.0000 mg | ORAL_TABLET | Freq: Every day | ORAL | 3 refills | Status: DC

## 2022-09-07 NOTE — Patient Instructions (Addendum)
Zio monitor for atrial fibrillation   Medication Instructions:  Please increase the metoprolol succinate up to 50 mg daily  If you need a refill on your cardiac medications before your next appointment, please call your pharmacy.   Lab work: No new labs needed  Testing/Procedures: Your physician has recommended that you wear a Zio monitor.   This monitor is a medical device that records the heart's electrical activity. Doctors most often use these monitors to diagnose arrhythmias. Arrhythmias are problems with the speed or rhythm of the heartbeat. The monitor is a small device applied to your chest. You can wear one while you do your normal daily activities. While wearing this monitor if you have any symptoms to push the button and record what you felt. Once you have worn this monitor for the period of time provider prescribed (Usually 14 days), you will return the monitor device in the postage paid box. Once it is returned they will download the data collected and provide [place] with a report which the provider will then review and we will call you with those results. Important tips:  Avoid showering during the first 24 hours of wearing the monitor. Avoid excessive sweating to help maximize wear time. Do not submerge the device, no hot tubs, and no swimming pools. Keep any lotions or oils away from the patch. After 24 hours you may shower with the patch on. Take brief showers with your back facing the shower head.  Do not remove patch once it has been placed because that will interrupt data and decrease adhesive wear time. Push the button when you have any symptoms and write down what you were feeling. Once you have completed wearing your monitor, remove and place into box which has postage paid and place in your outgoing mailbox.  If for some reason you have misplaced your box then call our office and we can provide another box and/or mail it off for you.  Follow-Up: At [facility], you  and your health needs are our priority.  As part of our continuing mission to provide you with exceptional heart care, we have created designated Provider Care Teams.  These Care Teams include your primary Cardiologist (physician) and Advanced Practice Providers (APPs -  Physician Assistants and Nurse Practitioners) who all work together to provide you with the care you need, when you need it.  You will need a follow up appointment in 12 months  Providers on your designated Care Team:   Murray Hodgkins, NP Christell Faith, PA-C Cadence Kathlen Mody, [place]  COVID-19 Vaccine Information can be found at: [email or web address] For questions related to vaccine distribution or appointments, please email [email or web address] or call [phone].

## 2022-09-09 DIAGNOSIS — I480 Paroxysmal atrial fibrillation: Secondary | ICD-10-CM | POA: Diagnosis not present

## 2022-10-10 ENCOUNTER — Ambulatory Visit: Payer: Managed Care, Other (non HMO) | Attending: Hematology and Oncology

## 2022-10-10 VITALS — Wt 182.2 lb

## 2022-10-10 DIAGNOSIS — Z483 Aftercare following surgery for neoplasm: Secondary | ICD-10-CM | POA: Insufficient documentation

## 2022-10-10 NOTE — Therapy (Signed)
OUTPATIENT PHYSICAL THERAPY SOZO SCREENING NOTE   Patient Name: Kristina Harvey MRN: [id number] DOB:[birth year], 57 y.o., female Today's Date: 10/10/2022  PCP: Alroy Dust, L.Marlou Sa, MD REFERRING PROVIDER: Nicholas Lose, MD   PT End of Session - 10/10/22 [id number]     Visit Number 5   # unchanged due to screen only   PT Start Time 0900    PT Stop Time 0904    PT Time Calculation (min) 4 min    Activity Tolerance Patient tolerated treatment well    Behavior During Therapy [facility] for tasks assessed/performed             Past Medical History:  Diagnosis Date   Arrhythmia    paroxysmal atrial fibrillation   Cancer [facility])    Family history of breast cancer    Family history of prostate cancer    Family history of stomach cancer    History of radiation therapy    left breast and subclavian  05/18/2021-07/01/2021  Dr Gery Pray   Hyperlipidemia    Hypothyroidism    Thyroid disease    Past Surgical History:  Procedure Laterality Date   BREAST LUMPECTOMY     BREAST LUMPECTOMY WITH RADIOACTIVE SEED AND SENTINEL LYMPH NODE BIOPSY Left 04/16/2021   Procedure: LEFT BREAST LUMPECTOMY WITH RADIOACTIVE SEED AND SENTINEL LYMPH NODE BIOPSY;  Surgeon: Jovita Kussmaul, MD;  Location: [place];  Service: General;  Laterality: Left;   chin implant  1996   IR IMAGING GUIDED PORT INSERTION  11/24/2020   IR REMOVAL TUN ACCESS W/ PORT W/O FL MOD SED  12/15/2021   RADIOACTIVE SEED GUIDED AXILLARY SENTINEL LYMPH NODE Left 04/16/2021   Procedure: RADIOACTIVE SEED GUIDED AXILLARY SENTINEL LYMPH NODE DISSECTION;  Surgeon: Jovita Kussmaul, MD;  Location: [place];  Service: General;  Laterality: Left;   Patient Active Problem List   Diagnosis Date Noted   Port-A-Cath in place 01/07/2021   Family history of breast cancer    Family history of prostate cancer    Family history of stomach cancer    Malignant neoplasm of lower-outer quadrant of left breast of female, estrogen receptor negative ([place]) 11/09/2020   Atrial  fibrillation ([place]) 10/01/2019   Disorder of thyroid gland 10/01/2019   Genetic testing 09/22/2017    REFERRING DIAG: left breast cancer at risk for lymphedema  THERAPY DIAG: Aftercare following surgery for neoplasm  PERTINENT HISTORY: Patient was diagnosed on 10/14/2020 with left grade III invasive ductal carcinoma. She  underwent neoadjuvant chemotherapy from 11/27/220-03/12/2021 followed by a left lumpectomy and sentinel node biopsy (3 negative nodes) on 04/16/2021. Completed radiation   PRECAUTIONS: left UE Lymphedema risk, None  SUBJECTIVE: Pt returns for her 3 month L-Dex screen.  PAIN:  Are you having pain? No  SOZO SCREENING: Patient was assessed today using the SOZO machine to determine the lymphedema index score. This was compared to her baseline score. It was determined that she is within the recommended range when compared to her baseline and no further action is needed at this time. She will continue SOZO screenings. These are done every 3 months for 2 years post operatively followed by every 6 months for 2 years, and then annually.   L-DEX FLOWSHEETS - 10/10/22 0900       L-DEX LYMPHEDEMA SCREENING   Measurement Type Unilateral    L-DEX MEASUREMENT EXTREMITY Upper Extremity    POSITION  Standing    DOMINANT SIDE Right    At Risk Side Left    BASELINE SCORE (  UNILATERAL) -7.3    L-DEX SCORE (UNILATERAL) -1    VALUE CHANGE (UNILAT) 6.3              Otelia Limes, PTA 10/10/2022, 9:05 AM

## 2022-10-17 ENCOUNTER — Telehealth: Payer: Self-pay

## 2022-10-17 DIAGNOSIS — I480 Paroxysmal atrial fibrillation: Secondary | ICD-10-CM

## 2022-10-17 MED ORDER — FLECAINIDE ACETATE 50 MG PO TABS: 50.0000 mg | ORAL_TABLET | Freq: Two times a day (BID) | ORAL | 3 refills | Status: DC

## 2022-10-17 MED ORDER — APIXABAN 5 MG PO TABS: 5.0000 mg | ORAL_TABLET | Freq: Two times a day (BID) | ORAL | 3 refills | Status: DC

## 2022-10-17 NOTE — Telephone Encounter (Signed)
Pt made aware of monitor results along with MD's recommendation  Pt verbalized understanding Orders placed and message sent to scheduling   Gollan, Kathlene November, MD  P Cv Div [place] Triage Event monitor Showing paroxysmal atrial fibrillation Would continue metoprolol, add flecainide 50 twice daily Stroke risk from atrial fibrillation approximately 1 to 2 %/year Would start Eliquis 5 twice a day For continued breakthrough atrial fibrillation spells would let us know, may need to go to higher dose flecainide 100 mg twice a day After 2 weeks on flecainide need flecainide treadmill

## 2022-10-27 ENCOUNTER — Ambulatory Visit: Payer: Managed Care, Other (non HMO)

## 2022-10-29 ENCOUNTER — Other Ambulatory Visit: Payer: Self-pay | Admitting: Cardiovascular Disease

## 2022-11-01 ENCOUNTER — Other Ambulatory Visit: Payer: Self-pay | Admitting: Hematology and Oncology

## 2022-11-01 DIAGNOSIS — Z853 Personal history of malignant neoplasm of breast: Secondary | ICD-10-CM

## 2022-11-07 ENCOUNTER — Ambulatory Visit
Admission: RE | Admit: 2022-11-07 | Discharge: 2022-11-07 | Disposition: A | Discharge status: Home / Self Care | Payer: Managed Care, Other (non HMO) | Source: Ambulatory Visit | Attending: Hematology and Oncology | Admitting: Hematology and Oncology

## 2022-11-07 DIAGNOSIS — Z853 Personal history of malignant neoplasm of breast: Secondary | ICD-10-CM

## 2022-11-16 ENCOUNTER — Ambulatory Visit: Payer: Managed Care, Other (non HMO) | Admitting: Cardiology

## 2022-12-26 NOTE — Progress Notes (Unsigned)
Cardiology Office Note  Date:  12/27/2022   ID:  KARL STAUNTON, DOB [birth year], MRN [id number]  PCP:  Asencion Gowda.August Saucer, MD   Chief Complaint  Patient presents with   Follow up A-Fib.     Patient c/o mild shortness of breath at times. Patient never started the Eliquis 5 mg; would like to discuss the risk for stroke with not taking it. Medications reviewed by the patient verbally.     HPI:  Kristina Harvey is a 57 y.o. female with past medical history of Paroxysmal tachycardia dating back several years Periodic alcohol Snoring, unable to exclude sleep apnea Hyperlipidemia stage IIa HER2 positive breast cancer diagnosed in March 2022 she is status post neoadjuvant chemotherapy followed by lumpectomy, adjuvant radiation, and maintenance Herceptin Perjeta which completed in April 2023.  Echocardiogram April 2022, several studies, most recently January 2023 EF 60% Presents for follow-up of her atrial fibrillation  Last seen by myself January 2024 History of breast cancer , TCHP x6 cycles , Trastuzumab + Pertuzumab q21d , XRT 33 rounds Has made full recovery, treatments completed  On prior clinic visit reported having tachypalpitations Zio 10/07/22 finding atrial fibrillation Full report as below Normal sinus rhythm Patient had a min HR of 53 bpm, max HR of 240 bpm, and avg HR of 81 bpm.   Atrial Fibrillation/Flutter occurred (<1% burden), ranging from 76-188 bpm (avg of 109 bpm), the longest lasting 30 mins 49 secs with an avg rate of 109 bpm.  Atrial Fibrillation/Flutter was detected within +/- 45 seconds of symptomatic patient event(s).    Was started on flecainide 50 twice daily with metoprolol succinate 50 daily Tachypalpitations dramatically improved Now with rare tachycardia, rate 117, 15 min at a time, breaks without intervention  CHADS VASC of 1 She decided not to start Eliquis  Cardiac risk factors Nonsmoker No diabetes  Prior history of IBS, treated with colestipol  EKG  personally reviewed by myself on todays visit Shows normal sinus rhythm rate 73 bpm no significant ST-T wave changes, QTc 420  Lab work reviewed from primary care total cholesterol 230, LDL 143 August 2021  Since then she reports she has started the colestipol   prior episode;06/30/19 Felt lightheaded getting out of the car with some diaphoresis Went to urgent care EKG documented atrial fibrillation, transferred by EMS to the emergency room On route was given medication Converted to normal sinus rhythm by the time she got to the emergency room A. fib lasting 2 hours  Husband reported that she has snoring  paroxysmal tachycardia dating back several years  CT abdomen pelvis 2017  showing no significant aortic atherosclerosis   PMH:   has a past medical history of Arrhythmia, Cancer (HCC), Family history of breast cancer, Family history of prostate cancer, Family history of stomach cancer, History of radiation therapy, Hyperlipidemia, Hypothyroidism, and Thyroid disease.  PSH:    Past Surgical History:  Procedure Laterality Date   BREAST LUMPECTOMY     BREAST LUMPECTOMY WITH RADIOACTIVE SEED AND SENTINEL LYMPH NODE BIOPSY Left 04/16/2021   Procedure: LEFT BREAST LUMPECTOMY WITH RADIOACTIVE SEED AND SENTINEL LYMPH NODE BIOPSY;  Surgeon: Griselda Miner, MD;  Location[facility];  Service: General;  Laterality: Left;   chin implant  1996   IR IMAGING GUIDED PORT INSERTION  11/24/2020   IR REMOVAL TUN ACCESS W/ PORT W/O FL MOD SED  12/15/2021   RADIOACTIVE SEED GUIDED AXILLARY SENTINEL LYMPH NODE Left 04/16/2021   Procedure: RADIOACTIVE SEED GUIDED AXILLARY SENTINEL  LYMPH NODE DISSECTION;  Surgeon: Griselda Miner, MD;  Location: [facility] OR;  Service: General;  Laterality: Left;    Current Outpatient Medications  Medication Sig Dispense Refill   acetaminophen (TYLENOL) 500 MG tablet Take 1,000 mg by mouth every 6 (six) hours as needed for moderate pain.     ALPRAZolam (XANAX) 0.25 MG tablet Take  0.25 mg by mouth at bedtime as needed for sleep.     clindamycin (CLEOCIN T) 1 % lotion Apply topically as directed.     colestipol (COLESTID) 1 g tablet Take 1 g by mouth at bedtime.     flecainide (TAMBOCOR) 50 MG tablet Take 1 tablet (50 mg total) by mouth 2 (two) times daily. 180 tablet 3   levothyroxine (SYNTHROID) 75 MCG tablet Take 75 mcg by mouth daily before breakfast.     metoprolol succinate (TOPROL-XL) 50 MG 24 hr tablet Take 1 tablet (50 mg total) by mouth daily. 90 tablet 3   Multiple Vitamin (MULTIVITAMIN WITH MINERALS) TABS tablet Take 1 tablet by mouth daily.     Polyethyl Glycol-Propyl Glycol (SYSTANE OP) Place 1 drop into both eyes 2 (two) times daily.     Probiotic Product (PROBIOTIC PO) Take 1 capsule by mouth daily.     Psyllium (METAMUCIL PO) Take 1 Scoop by mouth daily.     metoprolol tartrate (LOPRESSOR) 25 MG tablet Take 1 tablet (25 mg total) by mouth 2 (two) times daily as needed (palpitations). (Patient not taking: Reported on 12/27/2022) 60 tablet 3   No current facility-administered medications for this visit.    Allergies:   Caffeine   Social History:  The patient  reports that she has never smoked. She has never used smokeless tobacco. She reports current alcohol use. She reports that she does not use drugs.   Family History:   family history includes Atrial fibrillation in her father; Breast cancer (age of onset: 52) in her maternal grandmother; Breast cancer (age of onset: 31) in her mother; Breast cancer (age of onset: 35) in her maternal aunt; Breast cancer (age of onset: 33) in her paternal grandmother; Heart Problems in her maternal aunt; Prostate cancer (age of onset: 87) in her maternal uncle; Stomach cancer (age of onset: 107) in her paternal grandfather.    Review of Systems: Review of Systems  Constitutional: Negative.   HENT: Negative.    Respiratory: Negative.    Cardiovascular: Negative.   Gastrointestinal: Negative.   Musculoskeletal:  Negative.   Neurological: Negative.   Psychiatric/Behavioral: Negative.    All other systems reviewed and are negative.   PHYSICAL EXAM: VS:  BP (!) 120/58 (BP Location: Right Arm, Patient Position: Sitting, Cuff Size: Normal)   Pulse 73   Ht 5' 7.5" (1.715 m)   Wt 187 lb (84.8 kg)   SpO2 98%   BMI 28.86 kg/m  , BMI Body mass index is 28.86 kg/m. Constitutional:  oriented to person, place, and time. No distress.  HENT:  Head: Grossly normal Eyes:  no discharge. No scleral icterus.  Neck: No JVD, no carotid bruits  Cardiovascular: Regular rate and rhythm, no murmurs appreciated Pulmonary/Chest: Clear to auscultation bilaterally, no wheezes or rails Abdominal: Soft.  no distension.  no tenderness.  Musculoskeletal: Normal range of motion Neurological:  normal muscle tone. Coordination normal. No atrophy Skin: Skin warm and dry Psychiatric: normal affect, pleasant  Recent Labs: No results found for requested labs within last 365 days.    Lipid Panel No results found for: "CHOL", "HDL", "  LDLCALC", "TRIG"    Wt Readings from Last 3 Encounters:  12/27/22 187 lb (84.8 kg)  10/10/22 182 lb 4 oz (82.7 kg)  09/07/22 180 lb (81.6 kg)     ASSESSMENT AND PLAN:  Problem List Items Addressed This Visit       Cardiology Problems   Atrial fibrillation (HCC)     Other   Malignant neoplasm of lower-outer quadrant of left breast of female, estrogen receptor negative (HCC)   Other Visit Diagnoses     Mixed hyperlipidemia    -  Primary     Paroxysmal atrial fibrillation Monitor confirming atrial fibrillation Reports symptoms much better controlled on metoprolol succinate 50 daily with flecainide 50 twice daily No changes to medications Currently not on anticoagulation, low CHADS VASC 1 Eliquis was previously offered  Hyperlipidemia CT scan images previously reviewed showing no aortic atherosclerosis from top of kidneys through pelvis Previously declined statins, Zetia On  colestipol  Breast cancer, on left Has completed chemotherapy, radiation   Total encounter time more than 30 minutes  Greater than 50% was spent in counseling and coordination of care with the patient    Signed, Dossie Arbour, M.D., Ph.D. [facility] [place], [place] [phone]

## 2022-12-27 ENCOUNTER — Encounter: Payer: Self-pay | Admitting: Cardiovascular Disease

## 2022-12-27 ENCOUNTER — Ambulatory Visit: Payer: Managed Care, Other (non HMO) | Attending: Cardiology | Admitting: Cardiovascular Disease

## 2022-12-27 VITALS — BP 120/58 | HR 73 | Ht 67.5 in | Wt 187.0 lb

## 2022-12-27 DIAGNOSIS — I480 Paroxysmal atrial fibrillation: Secondary | ICD-10-CM

## 2022-12-27 DIAGNOSIS — E782 Mixed hyperlipidemia: Secondary | ICD-10-CM | POA: Diagnosis not present

## 2022-12-27 DIAGNOSIS — Z171 Estrogen receptor negative status [ER-]: Secondary | ICD-10-CM

## 2022-12-27 DIAGNOSIS — C50512 Malignant neoplasm of lower-outer quadrant of left female breast: Secondary | ICD-10-CM | POA: Diagnosis not present

## 2022-12-27 MED ORDER — FLECAINIDE ACETATE 50 MG PO TABS: 50.0000 mg | ORAL_TABLET | Freq: Two times a day (BID) | ORAL | 3 refills | Status: DC

## 2022-12-27 MED ORDER — METOPROLOL SUCCINATE ER 50 MG PO TB24: 50.0000 mg | ORAL_TABLET | Freq: Every day | ORAL | 3 refills | Status: DC

## 2022-12-27 NOTE — Patient Instructions (Addendum)
Your physician recommends that you continue on your current medications as directed. Please refer to the Current Medication list given to you today.   If you need a refill on your cardiac medications before your next appointment, please call your pharmacy.   Lab work: No new labs needed  Testing/Procedures: No new testing needed  Follow-Up: At [facility], you and your health needs are our priority.  As part of our continuing mission to provide you with exceptional heart care, we have created designated Provider Care Teams.  These Care Teams include your primary Cardiologist (physician) and Advanced Practice Providers (APPs -  Physician Assistants and Nurse Practitioners) who all work together to provide you with the care you need, when you need it.  You will need a follow up appointment in 6 months  Providers on your designated Care Team:   Nicolasa Ducking, NP Eula Listen, PA-C Cadence Fransico Michael, [place]  COVID-19 Vaccine Information can be found at: [email or web address] For questions related to vaccine distribution or appointments, please email [email or web address] or call [phone].

## 2023-01-02 ENCOUNTER — Other Ambulatory Visit: Payer: Self-pay | Admitting: Cardiovascular Disease

## 2023-01-16 ENCOUNTER — Ambulatory Visit: Payer: Managed Care, Other (non HMO) | Attending: General Surgery

## 2023-01-16 VITALS — Wt 185.1 lb

## 2023-01-16 DIAGNOSIS — Z483 Aftercare following surgery for neoplasm: Secondary | ICD-10-CM | POA: Insufficient documentation

## 2023-01-16 NOTE — Therapy (Signed)
OUTPATIENT PHYSICAL THERAPY SOZO SCREENING NOTE   Patient Name: Kristina Harvey MRN: [id number] DOB:02-27-1966, 57 y.o., female Today's Date: 01/16/2023  PCP: Clovis Riley, L.August Saucer, MD REFERRING PROVIDER: Chevis Pretty III, MD   PT End of Session - 01/16/23 [phone]     Visit Number 5   # unchanged due to screen only   PT Start Time 0933    PT Stop Time 0937    PT Time Calculation (min) 4 min    Activity Tolerance Patient tolerated treatment well    Behavior During Therapy [facility] for tasks assessed/performed             Past Medical History:  Diagnosis Date   Arrhythmia    paroxysmal atrial fibrillation   Cancer [facility])    Family history of breast cancer    Family history of prostate cancer    Family history of stomach cancer    History of radiation therapy    left breast and subclavian  05/18/2021-07/01/2021  Dr Antony Blackbird   Hyperlipidemia    Hypothyroidism    Thyroid disease    Past Surgical History:  Procedure Laterality Date   BREAST LUMPECTOMY     BREAST LUMPECTOMY WITH RADIOACTIVE SEED AND SENTINEL LYMPH NODE BIOPSY Left 04/16/2021   Procedure: LEFT BREAST LUMPECTOMY WITH RADIOACTIVE SEED AND SENTINEL LYMPH NODE BIOPSY;  Surgeon: Griselda Miner, MD;  Location[facility];  Service: General;  Laterality: Left;   chin implant  1996   IR IMAGING GUIDED PORT INSERTION  11/24/2020   IR REMOVAL TUN ACCESS W/ PORT W/O FL MOD SED  12/15/2021   RADIOACTIVE SEED GUIDED AXILLARY SENTINEL LYMPH NODE Left 04/16/2021   Procedure: RADIOACTIVE SEED GUIDED AXILLARY SENTINEL LYMPH NODE DISSECTION;  Surgeon: Griselda Miner, MD;  Location[facility] OR;  Service: General;  Laterality: Left;   Patient Active Problem List   Diagnosis Date Noted   Port-A-Cath in place 01/07/2021   Family history of breast cancer    Family history of prostate cancer    Family history of stomach cancer    Malignant neoplasm of lower-outer quadrant of left breast of female, estrogen receptor negative (HCC) 11/09/2020   Atrial  fibrillation (HCC) 10/01/2019   Disorder of thyroid gland 10/01/2019   Genetic testing 09/22/2017    REFERRING DIAG: left breast cancer at risk for lymphedema  THERAPY DIAG: Aftercare following surgery for neoplasm  PERTINENT HISTORY: Patient was diagnosed on 10/14/2020 with left grade III invasive ductal carcinoma. She  underwent neoadjuvant chemotherapy from 11/27/220-03/12/2021 followed by a left lumpectomy and sentinel node biopsy (3 negative nodes) on 04/16/2021. Completed radiation   PRECAUTIONS: left UE Lymphedema risk, None  SUBJECTIVE: Pt returns for her 3 month L-Dex screen.  PAIN:  Are you having pain? No  SOZO SCREENING: Patient was assessed today using the SOZO machine to determine the lymphedema index score. This was compared to her baseline score. It was determined that she is within the recommended range when compared to her baseline and no further action is needed at this time. She will continue SOZO screenings. These are done every 3 months for 2 years post operatively followed by every 6 months for 2 years, and then annually.   L-DEX FLOWSHEETS - 01/16/23 0900       L-DEX LYMPHEDEMA SCREENING   Measurement Type Unilateral    L-DEX MEASUREMENT EXTREMITY Upper Extremity    POSITION  Standing    DOMINANT SIDE Right    At Risk Side Left    BASELINE  SCORE (UNILATERAL) -7.3    L-DEX SCORE (UNILATERAL) -4.7    VALUE CHANGE (UNILAT) 2.6              Hermenia Bers, PTA 01/16/2023, 9:36 AM

## 2023-01-26 ENCOUNTER — Other Ambulatory Visit: Payer: Self-pay | Admitting: Cardiovascular Disease

## 2023-04-24 ENCOUNTER — Ambulatory Visit: Payer: Managed Care, Other (non HMO) | Attending: General Surgery

## 2023-04-24 DIAGNOSIS — Z483 Aftercare following surgery for neoplasm: Secondary | ICD-10-CM | POA: Insufficient documentation

## 2023-05-23 ENCOUNTER — Telehealth: Payer: Self-pay | Admitting: Hematology and Oncology

## 2023-05-23 NOTE — Telephone Encounter (Signed)
05/23/23 Due to a change in the provider schedule, I called the patient and rescheduled her appointment. The patient is aware of the new appointment date and time.

## 2023-06-05 ENCOUNTER — Ambulatory Visit: Payer: Managed Care, Other (non HMO) | Admitting: Hematology and Oncology

## 2023-06-14 ENCOUNTER — Other Ambulatory Visit: Payer: Self-pay

## 2023-06-14 ENCOUNTER — Inpatient Hospital Stay: Payer: Managed Care, Other (non HMO) | Attending: Hematology and Oncology | Admitting: Hematology and Oncology

## 2023-06-14 VITALS — BP 128/80 | HR 78 | Temp 97.5°F | Resp 18 | Ht 67.5 in | Wt 180.7 lb

## 2023-06-14 DIAGNOSIS — Z853 Personal history of malignant neoplasm of breast: Secondary | ICD-10-CM | POA: Insufficient documentation

## 2023-06-14 DIAGNOSIS — I890 Lymphedema, not elsewhere classified: Secondary | ICD-10-CM | POA: Insufficient documentation

## 2023-06-14 DIAGNOSIS — Z171 Estrogen receptor negative status [ER-]: Secondary | ICD-10-CM | POA: Diagnosis not present

## 2023-06-14 DIAGNOSIS — C50512 Malignant neoplasm of lower-outer quadrant of left female breast: Secondary | ICD-10-CM

## 2023-06-14 DIAGNOSIS — Z923 Personal history of irradiation: Secondary | ICD-10-CM | POA: Insufficient documentation

## 2023-06-14 DIAGNOSIS — Z9221 Personal history of antineoplastic chemotherapy: Secondary | ICD-10-CM | POA: Insufficient documentation

## 2023-06-14 NOTE — Progress Notes (Signed)
HEMATOLOGY-ONCOLOGY TELEPHONE VISIT PROGRESS NOTE  I connected with our patient on 06/14/23 at 12:45 PM EDT by telephone and verified that I am speaking with the correct person using two identifiers.  I discussed the limitations, risks, security and privacy concerns of performing an evaluation and management service by telephone and the availability of in person appointments.  I also discussed with the patient that there may be a patient responsible charge related to this service. The patient expressed understanding and agreed to proceed.   History of Present Illness:  Discussed the use of AI scribe software for clinical note transcription with the patient, who gave verbal consent to proceed.  History of Present Illness   The patient, a breast cancer survivor in remission since March 2022, presents with persistent soreness in the affected breast. The soreness is not described as particularly painful, but is a constant discomfort. The patient also reports fluid build-up in the breast, which she manages by massaging it out. The patient has a history of heart rhythm issues, which have been exacerbated by radiation therapy. She is currently taking Tambocor to manage this condition. The patient has stopped taking Metamucil, but no reason was given for this change.        Oncology History  Malignant neoplasm of lower-outer quadrant of left breast of female, estrogen receptor negative (HCC)  08/28/2017 Genetic Testing   Negative. The [facility] gene panel offered by [employer] includes sequencing and deletion/duplication testing of the following 35 genes: APC, ATM, AXIN2, BARD1, BMPR1A, BRCA1, BRCA2, BRIP1, CHD1, CDK4, CDKN2A, CHEK2, EPCAM (large rearrangement only), HOXB13, (sequencing only), GALNT12, MLH1, MSH2, MSH3 (excluding repetitive portions of exon 1), MSH6, MUTYH, NBN, NTHL1, PALB2, PMS2, PTEN, RAD51C, RAD51D, RNF43, RPS20, SMAD4, STK11, and TP53. Sequencing was performed for select  regions of POLE and POLD1, and large rearrangement analysis was performed for select regions of GREM1.    11/04/2020 Initial Diagnosis   Screening mammogram showed a possible left breast mass and enlarged axillary lymph nodes. Diagnostic mammogram and US showed a 1.4cm mass at the 3:30 position in the left breast and 2 abnormal lymph nodes in the left axilla. 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   11/11/2020 Cancer Staging   Staging form: Breast, AJCC 8th Edition - Clinical stage from 11/11/2020: Stage IIA (cT1c, cN1, cM0, G3, ER-, PR-, HER2+) - Signed by Serena Croissant, MD on 11/11/2020 Stage prefix: Initial diagnosis Histologic grading system: 3 grade system Laterality: Left Staged by: Pathologist and managing physician Stage used in treatment planning: Yes National guidelines used in treatment planning: Yes Type of national guideline used in treatment planning: NCCN   11/27/2020 - 04/02/2021 Chemotherapy   TCHP x6 cycles       04/16/2021 Surgery   Left lumpectomy 04/16/2021: Pathologic complete response, 0/3 lymph nodes negative   05/03/2021 - 12/07/2021 Chemotherapy   Patient is on Treatment Plan : BREAST Trastuzumab + Pertuzumab q21d     05/20/2021 - 07/01/2021 Radiation Therapy   Site Technique Total Dose (Gy) Dose per Fx (Gy) Completed Fx Beam Energies  Breast, Left: Breast_Lt 3D 50.4/50.4 1.8 28/28 10X  Breast, Left: Breast_Lt_SCLV 3D 50.4/50.4 1.8 28/28 6X, 10X  Breast, Left: Breast_Lt_Bst 3D 10/10 2 5/5 6X, 10X       REVIEW OF SYSTEMS:   Constitutional: Denies fevers, chills or abnormal weight loss All other systems were reviewed with the patient and are negative. Observations/Objective:  Assessment Plan:  Malignant neoplasm of lower-outer quadrant of left breast of female, estrogen receptor negative (HCC) 11/04/2020:Screening mammogram showed a possible left breast mass and  enlarged axillary lymph nodes. Diagnostic mammogram and US showed a 1.4cm mass at the 3:30 position in the left breast and 2 abnormal lymph nodes in the left axilla. 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   Treatment Plan: 1. Neoadjuvant chemotherapy with TCH Perjeta 6 cycles completed 03/12/2021 followed by Herceptin Perjeta maintenance versus Kadcyla maintenance (based on response to neoadjuvant chemo) for 1 year completed 12/06/21 2. Left lumpectomy 04/16/2021: Pathologic complete response, 0/3 lymph nodes negative 3. Followed by adjuvant radiation therapy 05/19/2021-07/01/2021 4.  Consideration for neratinib (Did not want to take it) ------------------------------------------------------------------------------------------------------------------------------- Current Treatment: Surveillance Breast Cancer Surveillance: Mammogram 11/07/2022: Benign breast density category C Breast Exam: 06/14/2023: Benign    Peripheral neuropathy: Gradually improving  encouraged her to exercise regularly. We will see her back in 1 year --------------------------------- Assessment and Plan    Breast Cancer Stage II, diagnosed in March 2022. Persistent soreness in the breast. No new symptoms suggestive of recurrence. Discussed the use of a new blood test (Gardent Reveal) to detect early recurrence. -Order Gardent Reveal test. The company will arrange home blood draw. -Continue current medications as listed.  Lymphedema Self-managing with physical therapy techniques. No significant fluid accumulation requiring compression garments at this time. -Continue self-management techniques. -Consider use of compression sleeve for long flights or if symptoms worsen.  Follow-up in 1 year. Results of the Gardent Reveal test will be communicated when available.     I discussed the assessment and treatment plan with the patient. The  patient was provided an opportunity to ask questions and all were answered. The patient agreed with the plan and demonstrated an understanding of the instructions. The patient was advised to call back or seek an in-person evaluation if the symptoms worsen or if the condition fails to improve as anticipated.   I provided 12 minutes of non-face-to-face time during this encounter.  This includes time for charting and coordination of care   Tamsen Meek, MD

## 2023-06-14 NOTE — Assessment & Plan Note (Signed)
11/04/2020:Screening mammogram showed a possible left breast mass and enlarged axillary lymph nodes. Diagnostic mammogram and US showed a 1.4cm mass at the 3:30 position in the left breast and 2 abnormal lymph nodes in the left axilla. 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   Treatment Plan: 1. Neoadjuvant chemotherapy with TCH Perjeta 6 cycles completed 03/12/2021 followed by Herceptin Perjeta maintenance versus Kadcyla maintenance (based on response to neoadjuvant chemo) for 1 year completed 12/06/21 2. Left lumpectomy 04/16/2021: Pathologic complete response, 0/3 lymph nodes negative 3. Followed by adjuvant radiation therapy 05/19/2021-07/01/2021 4.  Consideration for neratinib (Did not want to take it) ------------------------------------------------------------------------------------------------------------------------------- Current Treatment: Surveillance Breast Cancer Surveillance: Mammogram 11/07/2022: Benign breast density category C Breast Exam: 06/14/2023: Benign    Peripheral neuropathy: Gradually improving  encouraged her to exercise regularly. We will see her back in 1 year

## 2023-06-20 ENCOUNTER — Encounter: Payer: Self-pay | Admitting: *Deleted

## 2023-06-20 NOTE — Progress Notes (Signed)
Per MD request, RN successfully faxed Guard[employer] Reveal orders.

## 2023-06-21 ENCOUNTER — Other Ambulatory Visit: Payer: Self-pay

## 2023-06-21 DIAGNOSIS — C50512 Malignant neoplasm of lower-outer quadrant of left female breast: Secondary | ICD-10-CM

## 2023-06-21 NOTE — Progress Notes (Signed)
Guardant reveal requisition and all supporting documents faxed to [phone] per MD. Fax confirmation received.

## 2023-06-30 ENCOUNTER — Ambulatory Visit: Payer: Managed Care, Other (non HMO) | Attending: Cardiovascular Disease | Admitting: Cardiovascular Disease

## 2023-06-30 ENCOUNTER — Encounter: Payer: Self-pay | Admitting: Cardiovascular Disease

## 2023-06-30 VITALS — BP 130/82 | HR 73 | Ht 67.5 in | Wt 180.2 lb

## 2023-06-30 DIAGNOSIS — I480 Paroxysmal atrial fibrillation: Secondary | ICD-10-CM

## 2023-06-30 DIAGNOSIS — C50512 Malignant neoplasm of lower-outer quadrant of left female breast: Secondary | ICD-10-CM | POA: Diagnosis not present

## 2023-06-30 DIAGNOSIS — Z171 Estrogen receptor negative status [ER-]: Secondary | ICD-10-CM

## 2023-06-30 DIAGNOSIS — E782 Mixed hyperlipidemia: Secondary | ICD-10-CM | POA: Diagnosis not present

## 2023-06-30 NOTE — Progress Notes (Signed)
Cardiology Office Note  Date:  06/30/2023   ID:  Kristina Harvey, DOB [birth year], MRN [id number]  PCP:  Asencion Gowda.August Saucer, MD (Inactive)   Chief Complaint  Patient presents with   6 month follow up     "Doing well." Medications reviewed by the patient verbally.     HPI:  Kristina Harvey is a 57 y.o. female with past medical history of Paroxysmal tachycardia dating back several years Periodic alcohol Snoring, unable to exclude sleep apnea Hyperlipidemia stage IIa HER2 positive breast cancer diagnosed in March 2022 she is status post neoadjuvant chemotherapy followed by lumpectomy, adjuvant radiation, and maintenance Herceptin Perjeta which completed in April 2023.  Atrial fibrillation confirmed on Zio monitor Echocardiogram April 2022, several studies, most recently January 2023 EF 60% Presents for follow-up of her atrial fibrillation  Last seen by myself May 2024 In follow-up today reports having rare episodes of atrial fibrillation Perhaps 2 episodes, typically present if she misses a dose of metoprolol or flecainide On metoprolol succinate 50 daily, flecainide 50 twice daily On this regiment arrhythmia well-controlled Not on anticoagulation, CHDS VASC 1 Has not been using her Apple Watch to track arrhythmia  Reports feeling well, denies shortness of breath or chest pain on exertion Works full-time, Optometrist  Recent lab work reviewed showing total cholesterol 265 LDL 171 Previously declined cholesterol medication  EKG personally reviewed by myself on todays visit EKG Interpretation Date/Time:  Friday June 30 2023 10:27:10 EST Ventricular Rate:  73 PR Interval:  158 QRS Duration:  84 QT Interval:  394 QTC Calculation: 434 R Axis:   47  Text Interpretation: Normal sinus rhythm Possible Left atrial enlargement When compared with ECG of 30-Jun-2019 16:13, No significant change was found Confirmed by Julien Nordmann [phone]) on 06/30/2023 10:56:39 AM   Other past  medical history reviewed History of breast cancer , TCHP x6 cycles , Trastuzumab + Pertuzumab q21d , XRT 33 rounds Has made full recovery, treatments completed  Zio 10/07/22 finding atrial fibrillation Normal sinus rhythm Patient had a min HR of 53 bpm, max HR of 240 bpm, and avg HR of 81 bpm.   Atrial Fibrillation/Flutter occurred (<1% burden), ranging from 76-188 bpm (avg of 109 bpm), the longest lasting 30 mins 49 secs with an avg rate of 109 bpm.  Atrial Fibrillation/Flutter was detected within +/- 45 seconds of symptomatic patient event(s).    Nonsmoker No diabetes Hyperlipidemia  Prior history of IBS, treated with colestipol  prior episode;06/30/19 Felt lightheaded getting out of the car with some diaphoresis Went to urgent care EKG documented atrial fibrillation, transferred by EMS to the emergency room On route was given medication Converted to normal sinus rhythm by the time she got to the emergency room A. fib lasting 2 hours  Husband reported that she has snoring  paroxysmal tachycardia dating back several years  CT abdomen pelvis 2017  showing no significant aortic atherosclerosis   PMH:   has a past medical history of Arrhythmia, Cancer (HCC), Family history of breast cancer, Family history of prostate cancer, Family history of stomach cancer, History of radiation therapy, Hyperlipidemia, Hypothyroidism, and Thyroid disease.  PSH:    Past Surgical History:  Procedure Laterality Date   BREAST LUMPECTOMY     BREAST LUMPECTOMY WITH RADIOACTIVE SEED AND SENTINEL LYMPH NODE BIOPSY Left 04/16/2021   Procedure: LEFT BREAST LUMPECTOMY WITH RADIOACTIVE SEED AND SENTINEL LYMPH NODE BIOPSY;  Surgeon: Griselda Miner, MD;  Location: [facility] OR;  Service: General;  Laterality: Left;  chin implant  1996   IR IMAGING GUIDED PORT INSERTION  11/24/2020   IR REMOVAL TUN ACCESS W/ PORT W/O FL MOD SED  12/15/2021   RADIOACTIVE SEED GUIDED AXILLARY SENTINEL LYMPH NODE Left 04/16/2021    Procedure: RADIOACTIVE SEED GUIDED AXILLARY SENTINEL LYMPH NODE DISSECTION;  Surgeon: Griselda Miner, MD;  Location: [facility] OR;  Service: General;  Laterality: Left;    Current Outpatient Medications  Medication Sig Dispense Refill   acetaminophen (TYLENOL) 500 MG tablet Take 1,000 mg by mouth every 6 (six) hours as needed for moderate pain.     ALPRAZolam (XANAX) 0.25 MG tablet Take 0.25 mg by mouth at bedtime as needed for sleep.     clindamycin (CLEOCIN T) 1 % lotion Apply topically as directed.     colestipol (COLESTID) 1 g tablet Take 1 g by mouth at bedtime.     flecainide (TAMBOCOR) 50 MG tablet Take 1 tablet (50 mg total) by mouth 2 (two) times daily. 180 tablet 3   levothyroxine (SYNTHROID) 75 MCG tablet Take 75 mcg by mouth daily before breakfast.     metoprolol succinate (TOPROL-XL) 50 MG 24 hr tablet Take 1 tablet (50 mg total) by mouth daily. 90 tablet 3   metoprolol tartrate (LOPRESSOR) 25 MG tablet TAKE 1 TABLET BY MOUTH TWICE A DAY AS NEEDED FOR PALPITATIONS 60 tablet 5   Multiple Vitamin (MULTIVITAMIN WITH MINERALS) TABS tablet Take 1 tablet by mouth daily.     Probiotic Product (PROBIOTIC PO) Take 1 capsule by mouth daily.     tacrolimus (PROTOPIC) 0.1 % ointment Apply topically 2 (two) times daily.     No current facility-administered medications for this visit.    Allergies:   Caffeine   Social History:  The patient  reports that she has never smoked. She has never used smokeless tobacco. She reports current alcohol use. She reports that she does not use drugs.   Family History:   family history includes Atrial fibrillation in her father; Breast cancer (age of onset: 52) in her maternal grandmother; Breast cancer (age of onset: 72) in her mother; Breast cancer (age of onset: 52) in her maternal aunt; Breast cancer (age of onset: 40) in her paternal grandmother; Heart Problems in her maternal aunt; Prostate cancer (age of onset: 66) in her maternal uncle; Stomach cancer (age of  onset: 49) in her paternal grandfather.    Review of Systems: Review of Systems  Constitutional: Negative.   HENT: Negative.    Respiratory: Negative.    Cardiovascular: Negative.   Gastrointestinal: Negative.   Musculoskeletal: Negative.   Neurological: Negative.   Psychiatric/Behavioral: Negative.    All other systems reviewed and are negative.   PHYSICAL EXAM: VS:  BP 130/82 (BP Location: Left Arm, Patient Position: Sitting, Cuff Size: Normal)   Pulse 73   Ht 5' 7.5" (1.715 m)   Wt 180 lb 4 oz (81.8 kg)   SpO2 98%   BMI 27.81 kg/m  , BMI Body mass index is 27.81 kg/m. Constitutional:  oriented to person, place, and time. No distress.  HENT:  Head: Grossly normal Eyes:  no discharge. No scleral icterus.  Neck: No JVD, no carotid bruits  Cardiovascular: Regular rate and rhythm, no murmurs appreciated Pulmonary/Chest: Clear to auscultation bilaterally, no wheezes or rails Abdominal: Soft.  no distension.  no tenderness.  Musculoskeletal: Normal range of motion Neurological:  normal muscle tone. Coordination normal. No atrophy Skin: Skin warm and dry Psychiatric: normal affect, pleasant  Recent Labs:  No results found for requested labs within last 365 days.    Lipid Panel No results found for: "CHOL", "HDL", "LDLCALC", "TRIG"    Wt Readings from Last 3 Encounters:  06/30/23 180 lb 4 oz (81.8 kg)  06/14/23 180 lb 11.2 oz (82 kg)  01/16/23 185 lb 2 oz (84 kg)     ASSESSMENT AND PLAN:  Problem List Items Addressed This Visit       Cardiology Problems   Atrial fibrillation (HCC) - Primary   Relevant Orders   EKG 12-Lead (Completed)   Other Visit Diagnoses     Mixed hyperlipidemia          Paroxysmal atrial fibrillation Continue metoprolol succinate 50 daily with flecainide 50 twice daily No changes to medications For recurrent episodes of A-fib, could increase metoprolol or flecainide or both Currently not on anticoagulation, low CHADS VASC  1 Eliquis was previously offered  Hyperlipidemia CT scan images 2017 previously showing no aortic atherosclerosis from top of kidneys through pelvis Previously declined statins, Zetia Again discussed today, she prefers diet and exercise first Total chol 265, LDL 170 Discussed screening study with CT coronary calcium scoring, she will call us if she is interested  Breast cancer, on left Has completed chemotherapy, radiation    Signed, Dossie Arbour, M.D., Ph.D. [facility] [place], [place] [phone]

## 2023-06-30 NOTE — Patient Instructions (Addendum)
Medication Instructions:  No changes  If you need a refill on your cardiac medications before your next appointment, please call your pharmacy.   Lab work: No new labs needed  Testing/Procedures: No new testing needed  Call if you would like a CT coronary calcium score   Follow-Up: At [facility], you and your health needs are our priority.  As part of our continuing mission to provide you with exceptional heart care, we have created designated Provider Care Teams.  These Care Teams include your primary Cardiologist (physician) and Advanced Practice Providers (APPs -  Physician Assistants and Nurse Practitioners) who all work together to provide you with the care you need, when you need it.  You will need a follow up appointment in 12 months  Providers on your designated Care Team:   Nicolasa Ducking, NP Eula Listen, PA-C Cadence Fransico Michael, [place]  COVID-19 Vaccine Information can be found at: [email or web address] For questions related to vaccine distribution or appointments, please email [email or web address] or call [phone].

## 2023-07-06 NOTE — Telephone Encounter (Signed)
Telephone call  

## 2023-07-07 ENCOUNTER — Telehealth: Payer: Self-pay

## 2023-07-07 ENCOUNTER — Encounter: Payer: Self-pay | Admitting: Hematology and Oncology

## 2023-07-07 NOTE — Telephone Encounter (Signed)
Called pt per MD to advise Guardant Reveal testing was negative/not detected. Pt verbalized understanding of results and knows [employer] will be in touch in 6 mo repeat lab.

## 2023-09-25 ENCOUNTER — Other Ambulatory Visit: Payer: Self-pay | Admitting: Hematology and Oncology

## 2023-09-25 DIAGNOSIS — Z9889 Other specified postprocedural states: Secondary | ICD-10-CM

## 2023-10-31 ENCOUNTER — Encounter: Payer: Self-pay | Admitting: Hematology and Oncology

## 2023-11-08 ENCOUNTER — Ambulatory Visit
Admission: RE | Admit: 2023-11-08 | Discharge: 2023-11-08 | Disposition: A | Discharge status: Home / Self Care | Source: Ambulatory Visit | Attending: Hematology and Oncology | Admitting: Hematology and Oncology

## 2023-11-08 ENCOUNTER — Other Ambulatory Visit: Payer: Self-pay | Admitting: Hematology and Oncology

## 2023-11-08 ENCOUNTER — Ambulatory Visit
Admission: RE | Admit: 2023-11-08 | Discharge: 2023-11-08 | Disposition: A | Discharge status: Home / Self Care | Payer: Managed Care, Other (non HMO) | Source: Ambulatory Visit | Attending: Hematology and Oncology | Admitting: Hematology and Oncology

## 2023-11-08 DIAGNOSIS — Z9889 Other specified postprocedural states: Secondary | ICD-10-CM

## 2024-01-01 ENCOUNTER — Telehealth: Payer: Self-pay

## 2024-01-01 NOTE — Telephone Encounter (Signed)
 Attempted to call pt regarding guardant results lvm for pt to return call back. Results were negative and labs will be drawn in the next  6 months.

## 2024-01-02 ENCOUNTER — Encounter: Payer: Self-pay | Admitting: Hematology and Oncology

## 2024-01-07 ENCOUNTER — Other Ambulatory Visit: Payer: Self-pay | Admitting: Cardiovascular Disease

## 2024-01-11 ENCOUNTER — Other Ambulatory Visit: Payer: Self-pay | Admitting: Obstetrics and Gynecology

## 2024-01-11 ENCOUNTER — Ambulatory Visit
Admission: RE | Admit: 2024-01-11 | Discharge: 2024-01-11 | Disposition: A | Discharge status: Home / Self Care | Source: Ambulatory Visit | Attending: Obstetrics and Gynecology | Admitting: Obstetrics and Gynecology

## 2024-01-11 DIAGNOSIS — R053 Chronic cough: Secondary | ICD-10-CM

## 2024-03-03 ENCOUNTER — Other Ambulatory Visit: Payer: Self-pay | Admitting: Cardiovascular Disease

## 2024-03-05 ENCOUNTER — Other Ambulatory Visit: Payer: Self-pay

## 2024-03-05 MED ORDER — METOPROLOL SUCCINATE ER 50 MG PO TB24: 50.0000 mg | ORAL_TABLET | Freq: Every day | ORAL | 1 refills | Status: DC

## 2024-06-13 ENCOUNTER — Inpatient Hospital Stay: Payer: Managed Care, Other (non HMO) | Attending: Hematology and Oncology | Admitting: Hematology and Oncology

## 2024-06-13 VITALS — BP 118/69 | HR 80 | Temp 97.8°F | Resp 18 | Ht 67.5 in | Wt 184.9 lb

## 2024-06-13 DIAGNOSIS — Z9221 Personal history of antineoplastic chemotherapy: Secondary | ICD-10-CM | POA: Diagnosis not present

## 2024-06-13 DIAGNOSIS — Z923 Personal history of irradiation: Secondary | ICD-10-CM | POA: Diagnosis not present

## 2024-06-13 DIAGNOSIS — Z853 Personal history of malignant neoplasm of breast: Secondary | ICD-10-CM | POA: Insufficient documentation

## 2024-06-13 DIAGNOSIS — Z803 Family history of malignant neoplasm of breast: Secondary | ICD-10-CM | POA: Insufficient documentation

## 2024-06-13 DIAGNOSIS — F32A Depression, unspecified: Secondary | ICD-10-CM | POA: Insufficient documentation

## 2024-06-13 DIAGNOSIS — E669 Obesity, unspecified: Secondary | ICD-10-CM | POA: Diagnosis not present

## 2024-06-13 DIAGNOSIS — Z171 Estrogen receptor negative status [ER-]: Secondary | ICD-10-CM | POA: Diagnosis not present

## 2024-06-13 DIAGNOSIS — C50512 Malignant neoplasm of lower-outer quadrant of left female breast: Secondary | ICD-10-CM | POA: Diagnosis not present

## 2024-06-13 NOTE — Progress Notes (Signed)
 Patient Care Team: Merilee, L.Addie, MD (Inactive) as PCP - General (Family Medicine) Curvin Deward MOULD, MD as Consulting Physician (General Surgery) Odean Potts, MD as Consulting Physician (Hematology and Oncology) Shannon Agent, MD as Consulting Physician (Radiation Oncology)  DIAGNOSIS:  Encounter Diagnosis  Name Primary?   Malignant neoplasm of lower-outer quadrant of left breast of female, estrogen receptor negative (HCC) Yes    SUMMARY OF ONCOLOGIC HISTORY: Oncology History  Malignant neoplasm of lower-outer quadrant of left breast of female, estrogen receptor negative (HCC)  08/28/2017 Genetic Testing   Negative. The [facility] gene panel offered by [employer] includes sequencing and deletion/duplication testing of the following 35 genes: APC, ATM, AXIN2, BARD1, BMPR1A, BRCA1, BRCA2, BRIP1, CHD1, CDK4, CDKN2A, CHEK2, EPCAM (large rearrangement only), HOXB13, (sequencing only), GALNT12, MLH1, MSH2, MSH3 (excluding repetitive portions of exon 1), MSH6, MUTYH, NBN, NTHL1, PALB2, PMS2, PTEN, RAD51C, RAD51D, RNF43, RPS20, SMAD4, STK11, and TP53. Sequencing was performed for select regions of POLE and POLD1, and large rearrangement analysis was performed for select regions of GREM1.    11/04/2020 Initial Diagnosis   Screening mammogram showed a possible left breast mass and enlarged axillary lymph nodes. Diagnostic mammogram and US  showed a 1.4cm mass at the 3:30 position in the left breast and 2 abnormal lymph nodes in the left axilla. 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   11/11/2020 Cancer Staging   Staging form: Breast, AJCC 8th Edition - Clinical stage from 11/11/2020: Stage IIA (cT1c, cN1, cM0, G3, ER-, PR-, HER2+) - Signed by Odean Potts, MD on 11/11/2020 Stage prefix: Initial diagnosis Histologic grading system: 3 grade system Laterality: Left Staged by: Pathologist and  managing physician Stage used in treatment planning: Yes National guidelines used in treatment planning: Yes Type of national guideline used in treatment planning: NCCN   11/27/2020 - 04/02/2021 Chemotherapy   TCHP x6 cycles       04/16/2021 Surgery   Left lumpectomy 04/16/2021: Pathologic complete response, 0/3 lymph nodes negative   05/03/2021 - 12/07/2021 Chemotherapy   Patient is on Treatment Plan : BREAST Trastuzumab  + Pertuzumab  q21d     05/20/2021 - 07/01/2021 Radiation Therapy   Site Technique Total Dose (Gy) Dose per Fx (Gy) Completed Fx Beam Energies  Breast, Left: Breast_Lt 3D 50.4/50.4 1.8 28/28 10X  Breast, Left: Breast_Lt_SCLV 3D 50.4/50.4 1.8 28/28 6X, 10X  Breast, Left: Breast_Lt_Bst 3D 10/10 2 5/5 6X, 10X       CHIEF COMPLIANT: Surveillance of breast cancer  HISTORY OF PRESENT ILLNESS: History of Present Illness Kristina Harvey is a 58 year old female with a history of breast cancer who presents for follow-up.  She experiences persistent discomfort and fatigue. There is a strong family history of breast cancer. Her current medications include two heart medications, a thyroid medication, and a stomach medication, with other medications used as needed, such as eczema cream. In March, she underwent a mammogram and ultrasound.     ALLERGIES:  is allergic to caffeine.  MEDICATIONS:  Current Outpatient Medications  Medication Sig Dispense Refill   acetaminophen  (TYLENOL ) 500 MG tablet Take 1,000 mg by mouth every 6 (six) hours as needed for moderate pain.     ALPRAZolam  (XANAX ) 0.25 MG tablet Take 0.25 mg by mouth at bedtime as needed for sleep.     clindamycin  (CLEOCIN  T) 1 % lotion Apply topically as directed.     colestipol  (COLESTID ) 1 g tablet Take  1 g by mouth at bedtime.     flecainide  (TAMBOCOR ) 50 MG tablet TAKE 1 TABLET BY MOUTH 2 TIMES A DAY 180 tablet 3   levothyroxine  (SYNTHROID) 75 MCG tablet Take 75 mcg by mouth daily before breakfast.     metoprolol   succinate (TOPROL -XL) 50 MG 24 hr tablet Take 1 tablet (50 mg total) by mouth daily. 90 tablet 1   metoprolol  tartrate (LOPRESSOR ) 25 MG tablet TAKE 1 TABLET BY MOUTH TWICE A DAY AS NEEDED FOR PALPITATIONS 60 tablet 5   Multiple Vitamin (MULTIVITAMIN WITH MINERALS) TABS tablet Take 1 tablet by mouth daily.     Probiotic Product (PROBIOTIC PO) Take 1 capsule by mouth daily.     tacrolimus (PROTOPIC) 0.1 % ointment Apply topically 2 (two) times daily.     No current facility-administered medications for this visit.    PHYSICAL EXAMINATION: ECOG PERFORMANCE STATUS: 1 - Symptomatic but completely ambulatory  Vitals:   06/13/24 1404  BP: 118/69  Pulse: 80  Resp: 18  Temp: 97.8 F (36.6 C)  SpO2: 99%   Filed Weights   06/13/24 1404  Weight: 184 lb 14.4 oz (83.9 kg)    Physical Exam Breast exam: No palpable lumps nodules of concern  (exam performed in the presence of a chaperone)  LABORATORY DATA:  I have reviewed the data as listed    Latest Ref Rng & Units 12/07/2021   10:02 AM 11/16/2021   10:47 AM 10/26/2021    9:59 AM  CMP  Glucose 70 - 99 mg/dL 894  893  92   BUN 6 - 20 mg/dL 18  15  10    Creatinine 0.44 - 1.00 mg/dL 9.40  9.43  9.36   Sodium 135 - 145 mmol/L 136  136  138   Potassium 3.5 - 5.1 mmol/L 4.2  4.4  3.9   Chloride 98 - 111 mmol/L 107  104  106   CO2 22 - 32 mmol/L 26  28  26    Calcium 8.9 - 10.3 mg/dL 9.1  9.5  8.8   Total Protein 6.5 - 8.1 g/dL 6.9  7.3  6.3   Total Bilirubin 0.3 - 1.2 mg/dL 0.3  0.5  0.4   Alkaline Phos 38 - 126 U/L 61  52  53   AST 15 - 41 U/L 21  22  19    ALT 0 - 44 U/L 19  22  19      Lab Results  Component Value Date   WBC 5.0 12/07/2021   HGB 12.2 12/07/2021   HCT 36.5 12/07/2021   MCV 100.8 (H) 12/07/2021   PLT 206 12/07/2021   NEUTROABS 3.6 12/07/2021    ASSESSMENT & PLAN:  Malignant neoplasm of lower-outer quadrant of left breast of female, estrogen receptor negative (HCC) 11/04/2020:Screening mammogram showed a possible  left breast mass and enlarged axillary lymph nodes. Diagnostic mammogram and US  showed a 1.4cm mass at the 3:30 position in the left breast and 2 abnormal lymph nodes in the left axilla. 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   Treatment Plan: 1. Neoadjuvant chemotherapy with [place] [facility] Perjeta  6 cycles completed 03/12/2021 followed by Herceptin  Perjeta  maintenance versus Kadcyla maintenance (based on response to neoadjuvant chemo) for 1 year completed 12/06/21 2. Left lumpectomy 04/16/2021: Pathologic complete response, 0/3 lymph nodes negative 3. Followed by adjuvant radiation therapy 05/19/2021-07/01/2021 4.  Consideration for neratinib (Did not want to take it) -------------------------------------------------------------------------------------------------------------------------------  Current Treatment: Surveillance Breast Cancer Surveillance: Mammogram 11/08/2023: Benign breast density category C Radiology recommended that we obtain breast MRI periodically.  We will obtain a breast MRI next year. Breast Exam: 06/13/2024: Benign    Peripheral neuropathy: Gradually improving  encouraged her to exercise regularly.  Weight issues: Patient has joined [employer]. We will see her back in 1 year  ------------------------------------- Assessment and Plan Assessment & Plan Malignant neoplasm of lower-outer quadrant of left breast, status post lumpectomy Status post lumpectomy with no abnormalities on recent imaging. Dense breast tissue noted. Discussed annual MRI screening due to lumpectomy site and family history, despite discomfort with previous MRI. - Schedule mammogram in March. - Consider MRI in August or September next year.  Obesity Weight gain post-initial loss, influenced by foot injury and immobility. Re-engaged with [employer].  Depression Depression possibly linked to weight gain, foot  injury, and fatigue. Weight management through [employer] may aid mood improvement.      Orders Placed This Encounter  Procedures   MR BREAST BILATERAL W WO CONTRAST INC CAD    Standing Status:   Future    Expected Date:   04/30/2025    Expiration Date:   07/29/2025    If indicated for the ordered procedure, I authorize the administration of contrast media per Radiology protocol:   Yes    What is the patient's sedation requirement?:   No Sedation    Does the patient have a pacemaker or implanted devices?:   No    Radiology Contrast Protocol - do NOT remove file path:   [email or web address]    Preferred imaging location?:   [facility] (table limit - 500lbs)    Release to patient:   Immediate   The patient has a good understanding of the overall plan. she agrees with it. she will call with any problems that may develop before the next visit here.  I personally spent a total of 30 minutes in the care of the patient today including preparing to see the patient, getting/reviewing separately obtained history, performing a medically appropriate exam/evaluation, counseling and educating, placing orders, referring and communicating with other health care professionals, documenting clinical information in the EHR, independently interpreting results, communicating results, and coordinating care.   Viinay K Ehsan Corvin, MD 06/13/24

## 2024-06-13 NOTE — Assessment & Plan Note (Signed)
11/04/2020:Screening mammogram showed a possible left breast mass and enlarged axillary lymph nodes. Diagnostic mammogram and US showed a 1.4cm mass at the 3:30 position in the left breast and 2 abnormal lymph nodes in the left axilla. 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   Treatment Plan: 1. Neoadjuvant chemotherapy with TCH Perjeta 6 cycles completed 03/12/2021 followed by Herceptin Perjeta maintenance versus Kadcyla maintenance (based on response to neoadjuvant chemo) for 1 year completed 12/06/21 2. Left lumpectomy 04/16/2021: Pathologic complete response, 0/3 lymph nodes negative 3. Followed by adjuvant radiation therapy 05/19/2021-07/01/2021 4.  Consideration for neratinib (Did not want to take it) ------------------------------------------------------------------------------------------------------------------------------- Current Treatment: Surveillance Breast Cancer Surveillance: Mammogram 11/07/2022: Benign breast density category C Breast Exam: 06/14/2023: Benign    Peripheral neuropathy: Gradually improving  encouraged her to exercise regularly. We will see her back in 1 year

## 2024-06-25 ENCOUNTER — Encounter: Payer: Self-pay | Admitting: *Deleted

## 2024-06-26 ENCOUNTER — Encounter: Payer: Self-pay | Admitting: Hematology and Oncology

## 2024-07-19 ENCOUNTER — Encounter: Payer: Self-pay | Admitting: Obstetrics and Gynecology

## 2024-07-19 DIAGNOSIS — Z853 Personal history of malignant neoplasm of breast: Secondary | ICD-10-CM

## 2024-09-05 ENCOUNTER — Other Ambulatory Visit: Payer: Self-pay | Admitting: Cardiovascular Disease

## 2024-09-18 ENCOUNTER — Other Ambulatory Visit: Payer: Self-pay | Admitting: Hematology and Oncology

## 2024-09-18 DIAGNOSIS — Z853 Personal history of malignant neoplasm of breast: Secondary | ICD-10-CM

## 2024-11-08 ENCOUNTER — Encounter

## 2025-06-16 ENCOUNTER — Inpatient Hospital Stay: Admitting: Hematology and Oncology
